# Patient Record
Sex: Female | Born: 1945
Health system: Southern US, Community
[De-identification: ages and names within clinical notes are randomized; demographics above are authoritative.]

## PROBLEM LIST (undated history)

## (undated) DIAGNOSIS — G834 Cauda equina syndrome: Secondary | ICD-10-CM

## (undated) DIAGNOSIS — R32 Unspecified urinary incontinence: Secondary | ICD-10-CM

## (undated) DIAGNOSIS — R112 Nausea with vomiting, unspecified: Secondary | ICD-10-CM

## (undated) DIAGNOSIS — M47816 Spondylosis without myelopathy or radiculopathy, lumbar region: Secondary | ICD-10-CM

## (undated) DIAGNOSIS — G8929 Other chronic pain: Secondary | ICD-10-CM

## (undated) DIAGNOSIS — T7421XA Adult sexual abuse, confirmed, initial encounter: Secondary | ICD-10-CM

## (undated) DIAGNOSIS — M549 Dorsalgia, unspecified: Secondary | ICD-10-CM

## (undated) DIAGNOSIS — M7060 Trochanteric bursitis, unspecified hip: Secondary | ICD-10-CM

## (undated) DIAGNOSIS — H04123 Dry eye syndrome of bilateral lacrimal glands: Secondary | ICD-10-CM

## (undated) DIAGNOSIS — J189 Pneumonia, unspecified organism: Secondary | ICD-10-CM

## (undated) DIAGNOSIS — M4180 Other forms of scoliosis, site unspecified: Secondary | ICD-10-CM

## (undated) DIAGNOSIS — R159 Full incontinence of feces: Secondary | ICD-10-CM

## (undated) DIAGNOSIS — I2510 Atherosclerotic heart disease of native coronary artery without angina pectoris: Secondary | ICD-10-CM

## (undated) DIAGNOSIS — N281 Cyst of kidney, acquired: Secondary | ICD-10-CM

## (undated) DIAGNOSIS — M6530 Trigger finger, unspecified finger: Secondary | ICD-10-CM

## (undated) DIAGNOSIS — M5416 Radiculopathy, lumbar region: Secondary | ICD-10-CM

## (undated) DIAGNOSIS — D759 Disease of blood and blood-forming organs, unspecified: Secondary | ICD-10-CM

## (undated) DIAGNOSIS — K5900 Constipation, unspecified: Secondary | ICD-10-CM

## (undated) DIAGNOSIS — B001 Herpesviral vesicular dermatitis: Secondary | ICD-10-CM

## (undated) DIAGNOSIS — M48061 Spinal stenosis, lumbar region without neurogenic claudication: Secondary | ICD-10-CM

## (undated) DIAGNOSIS — B009 Herpesviral infection, unspecified: Secondary | ICD-10-CM

## (undated) DIAGNOSIS — M76892 Other specified enthesopathies of left lower limb, excluding foot: Secondary | ICD-10-CM

## (undated) DIAGNOSIS — M5136 Other intervertebral disc degeneration, lumbar region: Secondary | ICD-10-CM

## (undated) DIAGNOSIS — F410 Panic disorder [episodic paroxysmal anxiety] without agoraphobia: Secondary | ICD-10-CM

## (undated) DIAGNOSIS — Z9889 Other specified postprocedural states: Secondary | ICD-10-CM

## (undated) DIAGNOSIS — M1990 Unspecified osteoarthritis, unspecified site: Secondary | ICD-10-CM

## (undated) DIAGNOSIS — G4700 Insomnia, unspecified: Secondary | ICD-10-CM

## (undated) DIAGNOSIS — L821 Other seborrheic keratosis: Secondary | ICD-10-CM

## (undated) DIAGNOSIS — Z8719 Personal history of other diseases of the digestive system: Secondary | ICD-10-CM

## (undated) DIAGNOSIS — M4302 Spondylolysis, cervical region: Secondary | ICD-10-CM

## (undated) DIAGNOSIS — S82409A Unspecified fracture of shaft of unspecified fibula, initial encounter for closed fracture: Secondary | ICD-10-CM

## (undated) DIAGNOSIS — I2699 Other pulmonary embolism without acute cor pulmonale: Secondary | ICD-10-CM

## (undated) DIAGNOSIS — Z87442 Personal history of urinary calculi: Secondary | ICD-10-CM

## (undated) DIAGNOSIS — M4802 Spinal stenosis, cervical region: Secondary | ICD-10-CM

## (undated) DIAGNOSIS — F419 Anxiety disorder, unspecified: Secondary | ICD-10-CM

## (undated) DIAGNOSIS — H353112 Nonexudative age-related macular degeneration, right eye, intermediate dry stage: Secondary | ICD-10-CM

## (undated) DIAGNOSIS — K7689 Other specified diseases of liver: Secondary | ICD-10-CM

## (undated) DIAGNOSIS — S82899A Other fracture of unspecified lower leg, initial encounter for closed fracture: Secondary | ICD-10-CM

## (undated) DIAGNOSIS — M51369 Other intervertebral disc degeneration, lumbar region without mention of lumbar back pain or lower extremity pain: Secondary | ICD-10-CM

## (undated) DIAGNOSIS — L570 Actinic keratosis: Secondary | ICD-10-CM

## (undated) DIAGNOSIS — I517 Cardiomegaly: Secondary | ICD-10-CM

## (undated) DIAGNOSIS — N390 Urinary tract infection, site not specified: Secondary | ICD-10-CM

## (undated) DIAGNOSIS — I700 Atherosclerosis of aorta: Secondary | ICD-10-CM

## (undated) DIAGNOSIS — B9620 Unspecified Escherichia coli [E. coli] as the cause of diseases classified elsewhere: Secondary | ICD-10-CM

## (undated) DIAGNOSIS — N319 Neuromuscular dysfunction of bladder, unspecified: Secondary | ICD-10-CM

## (undated) DIAGNOSIS — H348192 Central retinal vein occlusion, unspecified eye, stable: Secondary | ICD-10-CM

## (undated) DIAGNOSIS — I10 Essential (primary) hypertension: Secondary | ICD-10-CM

## (undated) DIAGNOSIS — H3530 Unspecified macular degeneration: Secondary | ICD-10-CM

## (undated) DIAGNOSIS — M797 Fibromyalgia: Secondary | ICD-10-CM

## (undated) DIAGNOSIS — E785 Hyperlipidemia, unspecified: Secondary | ICD-10-CM

## (undated) DIAGNOSIS — G43909 Migraine, unspecified, not intractable, without status migrainosus: Secondary | ICD-10-CM

## (undated) DIAGNOSIS — K8020 Calculus of gallbladder without cholecystitis without obstruction: Secondary | ICD-10-CM

## (undated) HISTORY — PX: TUBAL LIGATION: SHX77

## (undated) HISTORY — DX: Spinal stenosis, lumbar region without neurogenic claudication: M48.061

## (undated) HISTORY — DX: Fibromyalgia: M79.7

## (undated) HISTORY — DX: Other pulmonary embolism without acute cor pulmonale: I26.99

## (undated) HISTORY — PX: OTHER SURGICAL HISTORY: SHX169

## (undated) HISTORY — DX: Herpesviral vesicular dermatitis: B00.1

## (undated) HISTORY — DX: Essential (primary) hypertension: I10

## (undated) HISTORY — DX: Cauda equina syndrome: G83.4

## (undated) HISTORY — PX: CATARACT EXTRACTION, BILATERAL: SHX1313

## (undated) HISTORY — DX: Hyperlipidemia, unspecified: E78.5

## (undated) HISTORY — PX: COLONOSCOPY: SHX174

## (undated) HISTORY — PX: ROTATOR CUFF REPAIR: SHX139

## (undated) HISTORY — DX: Migraine, unspecified, not intractable, without status migrainosus: G43.909

## (undated) HISTORY — DX: Central retinal vein occlusion, unspecified eye, stable: H34.8192

## (undated) HISTORY — PX: HAND SURGERY: SHX662

## (undated) HISTORY — DX: Spinal stenosis, cervical region: M48.02

## (undated) HISTORY — PX: ABDOMINAL SURGERY: SHX537

## (undated) HISTORY — DX: Anxiety disorder, unspecified: F41.9

---

## 2009-10-27 DIAGNOSIS — M20012 Mallet finger of left finger(s): Secondary | ICD-10-CM | POA: Insufficient documentation

## 2010-03-19 DIAGNOSIS — M797 Fibromyalgia: Secondary | ICD-10-CM | POA: Insufficient documentation

## 2010-03-19 DIAGNOSIS — M5410 Radiculopathy, site unspecified: Secondary | ICD-10-CM | POA: Insufficient documentation

## 2010-03-19 DIAGNOSIS — M47817 Spondylosis without myelopathy or radiculopathy, lumbosacral region: Secondary | ICD-10-CM | POA: Insufficient documentation

## 2010-03-19 DIAGNOSIS — M47812 Spondylosis without myelopathy or radiculopathy, cervical region: Secondary | ICD-10-CM | POA: Insufficient documentation

## 2010-08-26 DIAGNOSIS — G8929 Other chronic pain: Secondary | ICD-10-CM | POA: Insufficient documentation

## 2010-08-26 DIAGNOSIS — M76899 Other specified enthesopathies of unspecified lower limb, excluding foot: Secondary | ICD-10-CM | POA: Insufficient documentation

## 2010-09-27 DIAGNOSIS — Z87898 Personal history of other specified conditions: Secondary | ICD-10-CM | POA: Insufficient documentation

## 2010-09-27 DIAGNOSIS — B009 Herpesviral infection, unspecified: Secondary | ICD-10-CM | POA: Insufficient documentation

## 2010-09-27 DIAGNOSIS — L280 Lichen simplex chronicus: Secondary | ICD-10-CM | POA: Insufficient documentation

## 2010-09-27 DIAGNOSIS — L570 Actinic keratosis: Secondary | ICD-10-CM | POA: Insufficient documentation

## 2010-11-02 DIAGNOSIS — L905 Scar conditions and fibrosis of skin: Secondary | ICD-10-CM | POA: Insufficient documentation

## 2010-11-02 DIAGNOSIS — L821 Other seborrheic keratosis: Secondary | ICD-10-CM | POA: Insufficient documentation

## 2011-02-08 DIAGNOSIS — D2239 Melanocytic nevi of other parts of face: Secondary | ICD-10-CM | POA: Insufficient documentation

## 2011-02-08 DIAGNOSIS — L738 Other specified follicular disorders: Secondary | ICD-10-CM | POA: Insufficient documentation

## 2011-07-04 DIAGNOSIS — H40009 Preglaucoma, unspecified, unspecified eye: Secondary | ICD-10-CM | POA: Insufficient documentation

## 2013-03-14 DIAGNOSIS — N390 Urinary tract infection, site not specified: Secondary | ICD-10-CM | POA: Diagnosis not present

## 2013-05-31 DIAGNOSIS — L600 Ingrowing nail: Secondary | ICD-10-CM | POA: Diagnosis not present

## 2013-06-28 DIAGNOSIS — L600 Ingrowing nail: Secondary | ICD-10-CM | POA: Diagnosis not present

## 2013-08-16 DIAGNOSIS — Z23 Encounter for immunization: Secondary | ICD-10-CM | POA: Diagnosis not present

## 2013-08-16 DIAGNOSIS — F4321 Adjustment disorder with depressed mood: Secondary | ICD-10-CM | POA: Diagnosis not present

## 2013-08-16 DIAGNOSIS — M545 Low back pain, unspecified: Secondary | ICD-10-CM | POA: Diagnosis not present

## 2013-08-22 DIAGNOSIS — IMO0002 Reserved for concepts with insufficient information to code with codable children: Secondary | ICD-10-CM | POA: Diagnosis not present

## 2013-08-22 DIAGNOSIS — M47812 Spondylosis without myelopathy or radiculopathy, cervical region: Secondary | ICD-10-CM | POA: Diagnosis not present

## 2013-08-22 DIAGNOSIS — M47817 Spondylosis without myelopathy or radiculopathy, lumbosacral region: Secondary | ICD-10-CM | POA: Diagnosis not present

## 2013-08-22 DIAGNOSIS — IMO0001 Reserved for inherently not codable concepts without codable children: Secondary | ICD-10-CM | POA: Diagnosis not present

## 2013-08-28 DIAGNOSIS — H3530 Unspecified macular degeneration: Secondary | ICD-10-CM | POA: Diagnosis not present

## 2013-08-28 DIAGNOSIS — H40009 Preglaucoma, unspecified, unspecified eye: Secondary | ICD-10-CM | POA: Diagnosis not present

## 2013-08-28 DIAGNOSIS — Z1231 Encounter for screening mammogram for malignant neoplasm of breast: Secondary | ICD-10-CM | POA: Diagnosis not present

## 2013-09-04 DIAGNOSIS — Z87898 Personal history of other specified conditions: Secondary | ICD-10-CM | POA: Diagnosis not present

## 2013-10-05 DIAGNOSIS — Z87898 Personal history of other specified conditions: Secondary | ICD-10-CM | POA: Diagnosis not present

## 2013-10-17 DIAGNOSIS — D239 Other benign neoplasm of skin, unspecified: Secondary | ICD-10-CM | POA: Diagnosis not present

## 2013-10-17 DIAGNOSIS — L821 Other seborrheic keratosis: Secondary | ICD-10-CM | POA: Diagnosis not present

## 2013-11-05 DIAGNOSIS — Z87898 Personal history of other specified conditions: Secondary | ICD-10-CM | POA: Diagnosis not present

## 2013-11-12 DIAGNOSIS — M549 Dorsalgia, unspecified: Secondary | ICD-10-CM | POA: Diagnosis not present

## 2013-11-19 DIAGNOSIS — H3530 Unspecified macular degeneration: Secondary | ICD-10-CM | POA: Diagnosis not present

## 2013-11-19 DIAGNOSIS — H40009 Preglaucoma, unspecified, unspecified eye: Secondary | ICD-10-CM | POA: Diagnosis not present

## 2013-12-05 DIAGNOSIS — Z87898 Personal history of other specified conditions: Secondary | ICD-10-CM | POA: Diagnosis not present

## 2014-01-02 DIAGNOSIS — M47817 Spondylosis without myelopathy or radiculopathy, lumbosacral region: Secondary | ICD-10-CM | POA: Diagnosis not present

## 2014-01-02 DIAGNOSIS — M159 Polyosteoarthritis, unspecified: Secondary | ICD-10-CM | POA: Diagnosis not present

## 2014-01-02 DIAGNOSIS — Z23 Encounter for immunization: Secondary | ICD-10-CM | POA: Diagnosis not present

## 2014-01-02 DIAGNOSIS — G479 Sleep disorder, unspecified: Secondary | ICD-10-CM | POA: Diagnosis not present

## 2014-01-02 DIAGNOSIS — M47812 Spondylosis without myelopathy or radiculopathy, cervical region: Secondary | ICD-10-CM | POA: Diagnosis not present

## 2014-01-06 DIAGNOSIS — Z87898 Personal history of other specified conditions: Secondary | ICD-10-CM | POA: Diagnosis not present

## 2014-02-04 DIAGNOSIS — Z87898 Personal history of other specified conditions: Secondary | ICD-10-CM | POA: Diagnosis not present

## 2014-02-12 ENCOUNTER — Ambulatory Visit (INDEPENDENT_AMBULATORY_CARE_PROVIDER_SITE_OTHER): Payer: Medicare Other | Admitting: Family Medicine

## 2014-02-12 ENCOUNTER — Ambulatory Visit (INDEPENDENT_AMBULATORY_CARE_PROVIDER_SITE_OTHER): Payer: Medicare Other

## 2014-02-12 VITALS — BP 110/70 | HR 60 | Temp 98.4°F | Resp 16 | Ht 65.5 in | Wt 146.0 lb

## 2014-02-12 DIAGNOSIS — S0031XA Abrasion of nose, initial encounter: Secondary | ICD-10-CM

## 2014-02-12 DIAGNOSIS — Z23 Encounter for immunization: Secondary | ICD-10-CM

## 2014-02-12 DIAGNOSIS — W19XXXA Unspecified fall, initial encounter: Secondary | ICD-10-CM | POA: Diagnosis not present

## 2014-02-12 DIAGNOSIS — M79642 Pain in left hand: Secondary | ICD-10-CM

## 2014-02-12 DIAGNOSIS — R040 Epistaxis: Secondary | ICD-10-CM

## 2014-02-12 DIAGNOSIS — N393 Stress incontinence (female) (male): Secondary | ICD-10-CM

## 2014-02-12 DIAGNOSIS — R32 Unspecified urinary incontinence: Secondary | ICD-10-CM | POA: Insufficient documentation

## 2014-02-12 DIAGNOSIS — R2681 Unsteadiness on feet: Secondary | ICD-10-CM | POA: Diagnosis not present

## 2014-02-12 DIAGNOSIS — T148XXA Other injury of unspecified body region, initial encounter: Secondary | ICD-10-CM

## 2014-02-12 DIAGNOSIS — I10 Essential (primary) hypertension: Secondary | ICD-10-CM

## 2014-02-12 DIAGNOSIS — T148 Other injury of unspecified body region: Secondary | ICD-10-CM | POA: Diagnosis not present

## 2014-02-12 MED ORDER — TETANUS-DIPHTHERIA TOXOIDS TD 5-2 LFU IM INJ: 0.5000 mL | INJECTION | Freq: Once | INTRAMUSCULAR | Status: DC

## 2014-02-12 NOTE — Progress Notes (Signed)
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 She fell while walking her dog in the park this morning when she stepped on a surface optical.  She landed straight on her nose and had a bloody nose. She also scraped her hands.  Patient says her right hand is nontender although it is mildly ecchymotic.  Left hand is swollen and tender in the thenar region. Patient says that she has had surgery in that hand in the past.  Patient denies any shoulder pain, loss of consciousness, or leg pain. Patient is unsure of her last tetanus shot  Objective: Patient has an abrasion on the right bridge of her nose with ecchymosis as well.  Examination of the nasal passage reveals small amount of blood but no active bleeding. Patient is alert and coordinated, articulate, with no neck pain and full range of motion of her neck.  Patient has full range of motion both shoulders.  Left hand has mild swelling in the thenar region and there is ecchymosis there as well. She is mildly tender in the left renal region.  Right hand shows full range of motion. Both wrists have good range of motion. Right hand shows ecchymosis in the proximal volar hand.  No wounds or visible on the knees and she has good range of motion of both lower extremities.  Intraocular motion is normal and gait is stable.  [facility] reading (PRIMARY) by  Dr.  Joseph Art: Left hand shows metal from prior ORIF.  No acute fx  Fall, initial encounter - Plan: DG Hand Complete Left  Abrasion - Plan: DISCONTINUED: tetanus & diphtheria toxoids (adult) (TENIVAC) injection 0.5 mL  Unstable gait  Epistaxis  Hand pain, left - Plan: DG Hand Complete Left  Stress incontinence  Essential hypertension  Need for TD vaccine - Plan: Td vaccine greater than or equal to 7yo preservative free IM Gentle washing of wounds.  Signed, Robyn Haber, MD

## 2014-03-07 DIAGNOSIS — Z87898 Personal history of other specified conditions: Secondary | ICD-10-CM | POA: Diagnosis not present

## 2014-03-07 DIAGNOSIS — S82899A Other fracture of unspecified lower leg, initial encounter for closed fracture: Secondary | ICD-10-CM

## 2014-03-07 HISTORY — DX: Other fracture of unspecified lower leg, initial encounter for closed fracture: S82.899A

## 2014-03-09 ENCOUNTER — Encounter (HOSPITAL_COMMUNITY): Payer: Self-pay

## 2014-03-09 ENCOUNTER — Ambulatory Visit (HOSPITAL_COMMUNITY)
Admission: RE | Admit: 2014-03-09 | Discharge: 2014-03-09 | Disposition: A | Discharge status: Home / Self Care | Payer: Medicare Other | Source: Ambulatory Visit | Attending: Family Medicine | Admitting: Family Medicine

## 2014-03-09 ENCOUNTER — Ambulatory Visit (INDEPENDENT_AMBULATORY_CARE_PROVIDER_SITE_OTHER): Payer: Medicare Other | Admitting: Family Medicine

## 2014-03-09 VITALS — BP 144/88 | HR 61 | Temp 97.6°F | Resp 18 | Ht 65.5 in | Wt 146.0 lb

## 2014-03-09 DIAGNOSIS — R040 Epistaxis: Secondary | ICD-10-CM | POA: Diagnosis not present

## 2014-03-09 DIAGNOSIS — R51 Headache: Secondary | ICD-10-CM | POA: Insufficient documentation

## 2014-03-09 DIAGNOSIS — S0003XA Contusion of scalp, initial encounter: Secondary | ICD-10-CM | POA: Diagnosis not present

## 2014-03-09 DIAGNOSIS — Y92009 Unspecified place in unspecified non-institutional (private) residence as the place of occurrence of the external cause: Principal | ICD-10-CM

## 2014-03-09 DIAGNOSIS — Y92099 Unspecified place in other non-institutional residence as the place of occurrence of the external cause: Secondary | ICD-10-CM

## 2014-03-09 DIAGNOSIS — R42 Dizziness and giddiness: Secondary | ICD-10-CM | POA: Insufficient documentation

## 2014-03-09 DIAGNOSIS — W19XXXA Unspecified fall, initial encounter: Secondary | ICD-10-CM | POA: Insufficient documentation

## 2014-03-09 DIAGNOSIS — S0990XA Unspecified injury of head, initial encounter: Secondary | ICD-10-CM | POA: Diagnosis present

## 2014-03-09 NOTE — Progress Notes (Signed)
Subjective:    Patient ID: Kaylee Weiss, female    DOB: [birth year], 69 y.o.   MRN: [id number]  HPI Chief Complaint  Patient presents with   Fall    hit her head and left shoulder   Epistaxis   This chart was scribed for Robyn Haber, MD by Thea Alken, ED Scribe. This patient was seen in room 7 and the patient's care was started at 3:05 PM.  HPI Comments: Kaylee Weiss is a 69 y.o. female who presents to the [facility] complaining of a fall PTA. Pt states she hit her head on a laundry shoot in her bedroom due to her shoe become stuck to the rug. Pt reports epistaxis, bleeding is controlled at this time, back pain, shoulder pain, generalized HA and a mild dizziness. Pt was seen for a fall 1 month ago. She denies LOC.  Past Medical History  Diagnosis Date   Hypertension    Fibromyalgia    Allergies  Allergen Reactions   Demerol [Meperidine] Anaphylaxis   Percocet [Oxycodone-Acetaminophen] Nausea And Vomiting   Prior to Admission medications   Medication Sig Start Date End Date Taking? Authorizing Provider  aspirin 81 MG tablet Take 81 mg by mouth daily.   Yes Historical Provider, MD  celecoxib (CELEBREX) 200 MG capsule Take 200 mg by mouth 2 (two) times daily.   Yes Historical Provider, MD  folic acid (FOLVITE) 1 MG tablet Take 1 mg by mouth daily.   Yes Historical Provider, MD  losartan (COZAAR) 50 MG tablet Take 50 mg by mouth daily.   Yes Historical Provider, MD  metoprolol (TOPROL-XL) 200 MG 24 hr tablet Take 200 mg by mouth daily.   Yes Historical Provider, MD  tolterodine (DETROL LA) 4 MG 24 hr capsule Take 4 mg by mouth daily.   Yes Historical Provider, MD  valACYclovir (VALTREX) 500 MG tablet Take 500 mg by mouth 2 (two) times daily.   Yes Historical Provider, MD   Review of Systems  HENT: Positive for nosebleeds.   Musculoskeletal: Positive for myalgias and back pain. Negative for gait problem.  Skin: Negative for wound.  Neurological:  Positive for dizziness and headaches. Negative for syncope, weakness and numbness.   Objective:   Physical Exam  Constitutional: She is oriented to person, place, and time. She appears well-developed and well-nourished. No distress.  HENT:  Head: Normocephalic and atraumatic.  Eyes: Conjunctivae and EOM are normal.  Neck: Neck supple.  Cardiovascular: Normal rate.   Pulmonary/Chest: Effort normal.  Musculoskeletal: Normal range of motion.  Neurological: She is alert and oriented to person, place, and time.  Cranial nerves 2-12 are intact. Steady gait. Pt is able to walk on heels.     Skin: Skin is warm and dry.  Psychiatric: She has a normal mood and affect. Her behavior is normal.  Nursing note and vitals reviewed.  dried blood left nostril Swollen ecchymotic left eye brow  Neg CT head without contrast Assessment & Plan:   1. Fall at home, initial encounter   2. Head injury, initial encounter    This chart was scribed in my presence and reviewed by me personally.    ICD-9-CM ICD-10-CM   1. Fall at home, initial encounter [phone] W19.XXXA CT Head Wo Contrast   E849.0 Y92.099 Ambulatory referral to Physical Therapy  2. Head injury, initial encounter 959.01 S09.90XA CT Head Wo Contrast     Ambulatory referral to Physical Therapy     Signed, Robyn Haber, MD

## 2014-03-09 NOTE — Patient Instructions (Addendum)
Go to [facility] and register at the Emergency Department for [facility]. DO NOT register as ER patient.   Head Injury You have received a head injury. It does not appear serious at this time. Headaches and vomiting are common following head injury. It should be easy to awaken from sleeping. Sometimes it is necessary for you to stay in the emergency department for a while for observation. Sometimes admission to the hospital may be needed. After injuries such as yours, most problems occur within the first 24 hours, but side effects may occur up to 7-10 days after the injury. It is important for you to carefully monitor your condition and contact your health care provider or seek immediate medical care if there is a change in your condition. WHAT ARE THE TYPES OF HEAD INJURIES? Head injuries can be as minor as a bump. Some head injuries can be more severe. More severe head injuries include:  A jarring injury to the brain (concussion).  A bruise of the brain (contusion). This mean there is bleeding in the brain that can cause swelling.  A cracked skull (skull fracture).  Bleeding in the brain that collects, clots, and forms a bump (hematoma). WHAT CAUSES A HEAD INJURY? A serious head injury is most likely to happen to someone who is in a car wreck and is not wearing a seat belt. Other causes of major head injuries include bicycle or motorcycle accidents, sports injuries, and falls. HOW ARE HEAD INJURIES DIAGNOSED? A complete history of the event leading to the injury and your current symptoms will be helpful in diagnosing head injuries. Many times, pictures of the brain, such as CT or MRI are needed to see the extent of the injury. Often, an overnight hospital stay is necessary for observation.  WHEN SHOULD I SEEK IMMEDIATE MEDICAL CARE?  You should get help right away if:  You have confusion or drowsiness.  You feel sick to your stomach (nauseous) or have continued, forceful  vomiting.  You have dizziness or unsteadiness that is getting worse.  You have severe, continued headaches not relieved by medicine. Only take over-the-counter or prescription medicines for pain, fever, or discomfort as directed by your health care provider.  You do not have normal function of the arms or legs or are unable to walk.  You notice changes in the black spots in the center of the colored part of your eye (pupil).  You have a clear or bloody fluid coming from your nose or ears.  You have a loss of vision. During the next 24 hours after the injury, you must stay with someone who can watch you for the warning signs. This person should contact local emergency services (911 in the [place] if you have seizures, you become unconscious, or you are unable to wake up. HOW CAN I PREVENT A HEAD INJURY IN THE FUTURE? The most important factor for preventing major head injuries is avoiding motor vehicle accidents. To minimize the potential for damage to your head, it is crucial to wear seat belts while riding in motor vehicles. Wearing helmets while bike riding and playing collision sports (like football) is also helpful. Also, avoiding dangerous activities around the house will further help reduce your risk of head injury.  WHEN CAN I RETURN TO NORMAL ACTIVITIES AND ATHLETICS? You should be reevaluated by your health care provider before returning to these activities. If you have any of the following symptoms, you should not return to activities or contact sports  until 1 week after the symptoms have stopped:  Persistent headache.  Dizziness or vertigo.  Poor attention and concentration.  Confusion.  Memory problems.  Nausea or vomiting.  Fatigue or tire easily.  Irritability.  Intolerant of bright lights or loud noises.  Anxiety or depression.  Disturbed sleep. MAKE SURE YOU:   Understand these instructions.  Will watch your condition.  Will get help right away if you are  not doing well or get worse. Document Released: 02/21/2005 Document Revised: 02/26/2013 Document Reviewed: 10/29/2012 [facility] Patient Information 2015 [place], [place]. This information is not intended to replace advice given to you by your health care provider. Make sure you discuss any questions you have with your health care provider.

## 2014-03-25 DIAGNOSIS — M6281 Muscle weakness (generalized): Secondary | ICD-10-CM | POA: Diagnosis not present

## 2014-03-25 DIAGNOSIS — S0990XD Unspecified injury of head, subsequent encounter: Secondary | ICD-10-CM | POA: Diagnosis not present

## 2014-03-25 DIAGNOSIS — R262 Difficulty in walking, not elsewhere classified: Secondary | ICD-10-CM | POA: Diagnosis not present

## 2014-03-25 DIAGNOSIS — W19XXXD Unspecified fall, subsequent encounter: Secondary | ICD-10-CM | POA: Diagnosis not present

## 2014-03-27 ENCOUNTER — Encounter: Payer: Self-pay | Admitting: Family Medicine

## 2014-03-27 ENCOUNTER — Ambulatory Visit (INDEPENDENT_AMBULATORY_CARE_PROVIDER_SITE_OTHER): Payer: Medicare Other | Admitting: Family Medicine

## 2014-03-27 VITALS — BP 112/66 | HR 54 | Temp 97.4°F | Resp 16 | Ht 65.0 in | Wt 144.0 lb

## 2014-03-27 DIAGNOSIS — H5310 Unspecified subjective visual disturbances: Secondary | ICD-10-CM

## 2014-03-27 DIAGNOSIS — H53141 Visual discomfort, right eye: Secondary | ICD-10-CM

## 2014-03-27 NOTE — Progress Notes (Signed)
° °  Subjective:    Patient ID: Kaylee Weiss, female    DOB: 24-Sep-1945, 69 y.o.   MRN: [id number] This chart was scribed for Robyn Haber, MD by Zola Button, Medical Scribe. This patient was seen in Room 24 and the patient's care was started at 1:39 PM.   HPI HPI Comments: Kaylee Weiss is a 69 y.o. female who presents to the [facility] to establish care. Patient states her vision has been "off;" she states she has been diagnosed with macular degeneration. She states she has some difficulty seeing. She states she falls often, about 10 or more times per year. Patient has been to PT, who found problems with her gait. However, she states that she has had problems for a while and that it is nothing new. She does have hx of PE.  Patient used to live in [place], [place]. However, she still has a cottage there outside of [place] [employer] [place]. 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 She fell while walking her dog in the park last month when she stepped on a surface obstacle       Review of Systems  Eyes: Positive for visual disturbance.  Musculoskeletal: Positive for gait problem.       Objective:   Physical Exam CONSTITUTIONAL: Well developed/well nourished HEAD: Normocephalic/atraumatic EYES: EOM/PERRL ENMT: Mucous membranes moist NECK: supple no meningeal signs SPINE: entire spine nontender CV: S1/S2 noted, no murmurs/rubs/gallops noted LUNGS: Lungs are clear to auscultation bilaterally, no apparent distress ABDOMEN: soft, nontender, no rebound or guarding GU: no cva tenderness NEURO: Pt is awake/alert, moves all extremitiesx4 EXTREMITIES: pulses normal, full ROM SKIN: warm, color normal PSYCH: no abnormalities of mood noted        Assessment & Plan:   This chart was scribed in my presence and reviewed by me personally.   ICD-9-CM ICD-10-CM   1. Right eye strain 368.13 H53.141 Ambulatory referral to Ophthalmology     Signed, Robyn Haber, MD

## 2014-04-01 DIAGNOSIS — W19XXXD Unspecified fall, subsequent encounter: Secondary | ICD-10-CM | POA: Diagnosis not present

## 2014-04-01 DIAGNOSIS — S0990XD Unspecified injury of head, subsequent encounter: Secondary | ICD-10-CM | POA: Diagnosis not present

## 2014-04-01 DIAGNOSIS — R262 Difficulty in walking, not elsewhere classified: Secondary | ICD-10-CM | POA: Diagnosis not present

## 2014-04-01 DIAGNOSIS — M6281 Muscle weakness (generalized): Secondary | ICD-10-CM | POA: Diagnosis not present

## 2014-04-03 ENCOUNTER — Telehealth: Payer: Self-pay

## 2014-04-03 NOTE — Telephone Encounter (Signed)
Spoke with referrals and pt has appt with Dr. Bing Plume. Pt contacted and notified.

## 2014-04-03 NOTE — Telephone Encounter (Signed)
Pt states that her vision has gotten progressively worse, she would like to know the status of her referral to the opthalmologist. Best# [phone]

## 2014-04-04 DIAGNOSIS — S0990XD Unspecified injury of head, subsequent encounter: Secondary | ICD-10-CM | POA: Diagnosis not present

## 2014-04-04 DIAGNOSIS — W19XXXD Unspecified fall, subsequent encounter: Secondary | ICD-10-CM | POA: Diagnosis not present

## 2014-04-04 DIAGNOSIS — R262 Difficulty in walking, not elsewhere classified: Secondary | ICD-10-CM | POA: Diagnosis not present

## 2014-04-04 DIAGNOSIS — M6281 Muscle weakness (generalized): Secondary | ICD-10-CM | POA: Diagnosis not present

## 2014-04-07 DIAGNOSIS — M6281 Muscle weakness (generalized): Secondary | ICD-10-CM | POA: Diagnosis not present

## 2014-04-07 DIAGNOSIS — S0990XD Unspecified injury of head, subsequent encounter: Secondary | ICD-10-CM | POA: Diagnosis not present

## 2014-04-07 DIAGNOSIS — Z87898 Personal history of other specified conditions: Secondary | ICD-10-CM | POA: Diagnosis not present

## 2014-04-07 DIAGNOSIS — W19XXXD Unspecified fall, subsequent encounter: Secondary | ICD-10-CM | POA: Diagnosis not present

## 2014-04-07 DIAGNOSIS — R262 Difficulty in walking, not elsewhere classified: Secondary | ICD-10-CM | POA: Diagnosis not present

## 2014-04-08 DIAGNOSIS — W19XXXD Unspecified fall, subsequent encounter: Secondary | ICD-10-CM | POA: Diagnosis not present

## 2014-04-08 DIAGNOSIS — S0990XD Unspecified injury of head, subsequent encounter: Secondary | ICD-10-CM | POA: Diagnosis not present

## 2014-04-08 DIAGNOSIS — M6281 Muscle weakness (generalized): Secondary | ICD-10-CM | POA: Diagnosis not present

## 2014-04-08 DIAGNOSIS — R262 Difficulty in walking, not elsewhere classified: Secondary | ICD-10-CM | POA: Diagnosis not present

## 2014-04-11 DIAGNOSIS — W19XXXD Unspecified fall, subsequent encounter: Secondary | ICD-10-CM | POA: Diagnosis not present

## 2014-04-11 DIAGNOSIS — M6281 Muscle weakness (generalized): Secondary | ICD-10-CM | POA: Diagnosis not present

## 2014-04-11 DIAGNOSIS — S0990XD Unspecified injury of head, subsequent encounter: Secondary | ICD-10-CM | POA: Diagnosis not present

## 2014-04-11 DIAGNOSIS — R262 Difficulty in walking, not elsewhere classified: Secondary | ICD-10-CM | POA: Diagnosis not present

## 2014-04-14 DIAGNOSIS — H3531 Nonexudative age-related macular degeneration: Secondary | ICD-10-CM | POA: Diagnosis not present

## 2014-04-14 DIAGNOSIS — H524 Presbyopia: Secondary | ICD-10-CM | POA: Diagnosis not present

## 2014-04-15 DIAGNOSIS — W19XXXD Unspecified fall, subsequent encounter: Secondary | ICD-10-CM | POA: Diagnosis not present

## 2014-04-15 DIAGNOSIS — S0990XD Unspecified injury of head, subsequent encounter: Secondary | ICD-10-CM | POA: Diagnosis not present

## 2014-04-15 DIAGNOSIS — R262 Difficulty in walking, not elsewhere classified: Secondary | ICD-10-CM | POA: Diagnosis not present

## 2014-04-15 DIAGNOSIS — M6281 Muscle weakness (generalized): Secondary | ICD-10-CM | POA: Diagnosis not present

## 2014-04-22 DIAGNOSIS — R262 Difficulty in walking, not elsewhere classified: Secondary | ICD-10-CM | POA: Diagnosis not present

## 2014-04-22 DIAGNOSIS — W19XXXD Unspecified fall, subsequent encounter: Secondary | ICD-10-CM | POA: Diagnosis not present

## 2014-04-22 DIAGNOSIS — M6281 Muscle weakness (generalized): Secondary | ICD-10-CM | POA: Diagnosis not present

## 2014-04-22 DIAGNOSIS — S0990XD Unspecified injury of head, subsequent encounter: Secondary | ICD-10-CM | POA: Diagnosis not present

## 2014-04-28 DIAGNOSIS — W19XXXD Unspecified fall, subsequent encounter: Secondary | ICD-10-CM | POA: Diagnosis not present

## 2014-04-28 DIAGNOSIS — R262 Difficulty in walking, not elsewhere classified: Secondary | ICD-10-CM | POA: Diagnosis not present

## 2014-04-28 DIAGNOSIS — M6281 Muscle weakness (generalized): Secondary | ICD-10-CM | POA: Diagnosis not present

## 2014-04-28 DIAGNOSIS — S0990XD Unspecified injury of head, subsequent encounter: Secondary | ICD-10-CM | POA: Diagnosis not present

## 2014-05-02 DIAGNOSIS — M6281 Muscle weakness (generalized): Secondary | ICD-10-CM | POA: Diagnosis not present

## 2014-05-02 DIAGNOSIS — S0990XD Unspecified injury of head, subsequent encounter: Secondary | ICD-10-CM | POA: Diagnosis not present

## 2014-05-02 DIAGNOSIS — W19XXXD Unspecified fall, subsequent encounter: Secondary | ICD-10-CM | POA: Diagnosis not present

## 2014-05-02 DIAGNOSIS — R262 Difficulty in walking, not elsewhere classified: Secondary | ICD-10-CM | POA: Diagnosis not present

## 2014-05-05 DIAGNOSIS — M6281 Muscle weakness (generalized): Secondary | ICD-10-CM | POA: Diagnosis not present

## 2014-05-05 DIAGNOSIS — R262 Difficulty in walking, not elsewhere classified: Secondary | ICD-10-CM | POA: Diagnosis not present

## 2014-05-05 DIAGNOSIS — W19XXXD Unspecified fall, subsequent encounter: Secondary | ICD-10-CM | POA: Diagnosis not present

## 2014-05-05 DIAGNOSIS — S0990XD Unspecified injury of head, subsequent encounter: Secondary | ICD-10-CM | POA: Diagnosis not present

## 2014-05-06 DIAGNOSIS — H31092 Other chorioretinal scars, left eye: Secondary | ICD-10-CM | POA: Diagnosis not present

## 2014-05-06 DIAGNOSIS — H43813 Vitreous degeneration, bilateral: Secondary | ICD-10-CM | POA: Diagnosis not present

## 2014-05-06 DIAGNOSIS — H3531 Nonexudative age-related macular degeneration: Secondary | ICD-10-CM | POA: Diagnosis not present

## 2014-05-06 DIAGNOSIS — Z87898 Personal history of other specified conditions: Secondary | ICD-10-CM | POA: Diagnosis not present

## 2014-05-09 DIAGNOSIS — W19XXXD Unspecified fall, subsequent encounter: Secondary | ICD-10-CM | POA: Diagnosis not present

## 2014-05-09 DIAGNOSIS — R262 Difficulty in walking, not elsewhere classified: Secondary | ICD-10-CM | POA: Diagnosis not present

## 2014-05-09 DIAGNOSIS — S0990XD Unspecified injury of head, subsequent encounter: Secondary | ICD-10-CM | POA: Diagnosis not present

## 2014-05-09 DIAGNOSIS — M6281 Muscle weakness (generalized): Secondary | ICD-10-CM | POA: Diagnosis not present

## 2014-05-12 DIAGNOSIS — S0990XD Unspecified injury of head, subsequent encounter: Secondary | ICD-10-CM | POA: Diagnosis not present

## 2014-05-12 DIAGNOSIS — M6281 Muscle weakness (generalized): Secondary | ICD-10-CM | POA: Diagnosis not present

## 2014-05-12 DIAGNOSIS — W19XXXD Unspecified fall, subsequent encounter: Secondary | ICD-10-CM | POA: Diagnosis not present

## 2014-05-12 DIAGNOSIS — R262 Difficulty in walking, not elsewhere classified: Secondary | ICD-10-CM | POA: Diagnosis not present

## 2014-05-13 ENCOUNTER — Telehealth: Payer: Self-pay

## 2014-05-13 NOTE — Telephone Encounter (Signed)
Pt is needing refill on losartan (COZAAR) 50 MG tablet [[id number]] to [facility] in [address]

## 2014-05-14 MED ORDER — LOSARTAN POTASSIUM 50 MG PO TABS: 50.0000 mg | ORAL_TABLET | Freq: Every day | ORAL | Status: DC

## 2014-05-14 NOTE — Telephone Encounter (Signed)
Refill sent.

## 2014-05-14 NOTE — Telephone Encounter (Signed)
Left message advising pt I sent in her refill.

## 2014-05-20 DIAGNOSIS — S0990XD Unspecified injury of head, subsequent encounter: Secondary | ICD-10-CM | POA: Diagnosis not present

## 2014-05-20 DIAGNOSIS — W19XXXD Unspecified fall, subsequent encounter: Secondary | ICD-10-CM | POA: Diagnosis not present

## 2014-05-20 DIAGNOSIS — M6281 Muscle weakness (generalized): Secondary | ICD-10-CM | POA: Diagnosis not present

## 2014-05-20 DIAGNOSIS — R262 Difficulty in walking, not elsewhere classified: Secondary | ICD-10-CM | POA: Diagnosis not present

## 2014-05-23 DIAGNOSIS — R262 Difficulty in walking, not elsewhere classified: Secondary | ICD-10-CM | POA: Diagnosis not present

## 2014-05-23 DIAGNOSIS — W19XXXD Unspecified fall, subsequent encounter: Secondary | ICD-10-CM | POA: Diagnosis not present

## 2014-05-23 DIAGNOSIS — S0990XD Unspecified injury of head, subsequent encounter: Secondary | ICD-10-CM | POA: Diagnosis not present

## 2014-05-23 DIAGNOSIS — M6281 Muscle weakness (generalized): Secondary | ICD-10-CM | POA: Diagnosis not present

## 2014-05-26 DIAGNOSIS — M6281 Muscle weakness (generalized): Secondary | ICD-10-CM | POA: Diagnosis not present

## 2014-05-26 DIAGNOSIS — R262 Difficulty in walking, not elsewhere classified: Secondary | ICD-10-CM | POA: Diagnosis not present

## 2014-05-26 DIAGNOSIS — S0990XD Unspecified injury of head, subsequent encounter: Secondary | ICD-10-CM | POA: Diagnosis not present

## 2014-05-26 DIAGNOSIS — W19XXXD Unspecified fall, subsequent encounter: Secondary | ICD-10-CM | POA: Diagnosis not present

## 2014-05-27 DIAGNOSIS — M6281 Muscle weakness (generalized): Secondary | ICD-10-CM | POA: Diagnosis not present

## 2014-05-27 DIAGNOSIS — R262 Difficulty in walking, not elsewhere classified: Secondary | ICD-10-CM | POA: Diagnosis not present

## 2014-05-27 DIAGNOSIS — W19XXXD Unspecified fall, subsequent encounter: Secondary | ICD-10-CM | POA: Diagnosis not present

## 2014-05-27 DIAGNOSIS — S0990XD Unspecified injury of head, subsequent encounter: Secondary | ICD-10-CM | POA: Diagnosis not present

## 2014-06-06 DIAGNOSIS — Z87898 Personal history of other specified conditions: Secondary | ICD-10-CM | POA: Diagnosis not present

## 2014-06-13 DIAGNOSIS — R262 Difficulty in walking, not elsewhere classified: Secondary | ICD-10-CM | POA: Diagnosis not present

## 2014-06-13 DIAGNOSIS — S0990XD Unspecified injury of head, subsequent encounter: Secondary | ICD-10-CM | POA: Diagnosis not present

## 2014-06-13 DIAGNOSIS — M6281 Muscle weakness (generalized): Secondary | ICD-10-CM | POA: Diagnosis not present

## 2014-06-13 DIAGNOSIS — W19XXXD Unspecified fall, subsequent encounter: Secondary | ICD-10-CM | POA: Diagnosis not present

## 2014-06-30 DIAGNOSIS — W19XXXD Unspecified fall, subsequent encounter: Secondary | ICD-10-CM | POA: Diagnosis not present

## 2014-06-30 DIAGNOSIS — M6281 Muscle weakness (generalized): Secondary | ICD-10-CM | POA: Diagnosis not present

## 2014-06-30 DIAGNOSIS — R262 Difficulty in walking, not elsewhere classified: Secondary | ICD-10-CM | POA: Diagnosis not present

## 2014-06-30 DIAGNOSIS — S0990XD Unspecified injury of head, subsequent encounter: Secondary | ICD-10-CM | POA: Diagnosis not present

## 2014-07-06 DIAGNOSIS — Z87898 Personal history of other specified conditions: Secondary | ICD-10-CM | POA: Diagnosis not present

## 2014-07-31 ENCOUNTER — Encounter: Payer: Self-pay | Admitting: Family Medicine

## 2014-07-31 ENCOUNTER — Ambulatory Visit (INDEPENDENT_AMBULATORY_CARE_PROVIDER_SITE_OTHER): Payer: Medicare Other | Admitting: Family Medicine

## 2014-07-31 VITALS — BP 141/84 | HR 58 | Temp 98.0°F | Resp 16

## 2014-07-31 DIAGNOSIS — Z8639 Personal history of other endocrine, nutritional and metabolic disease: Secondary | ICD-10-CM | POA: Diagnosis not present

## 2014-07-31 DIAGNOSIS — Z79899 Other long term (current) drug therapy: Secondary | ICD-10-CM | POA: Diagnosis not present

## 2014-07-31 DIAGNOSIS — G629 Polyneuropathy, unspecified: Secondary | ICD-10-CM

## 2014-07-31 DIAGNOSIS — I10 Essential (primary) hypertension: Secondary | ICD-10-CM | POA: Diagnosis not present

## 2014-07-31 DIAGNOSIS — B009 Herpesviral infection, unspecified: Secondary | ICD-10-CM | POA: Diagnosis not present

## 2014-07-31 DIAGNOSIS — N3946 Mixed incontinence: Secondary | ICD-10-CM | POA: Diagnosis not present

## 2014-07-31 DIAGNOSIS — N3289 Other specified disorders of bladder: Secondary | ICD-10-CM

## 2014-07-31 DIAGNOSIS — F329 Major depressive disorder, single episode, unspecified: Secondary | ICD-10-CM

## 2014-07-31 DIAGNOSIS — E785 Hyperlipidemia, unspecified: Secondary | ICD-10-CM

## 2014-07-31 DIAGNOSIS — F32A Depression, unspecified: Secondary | ICD-10-CM

## 2014-07-31 DIAGNOSIS — R269 Unspecified abnormalities of gait and mobility: Secondary | ICD-10-CM | POA: Diagnosis not present

## 2014-07-31 DIAGNOSIS — R2681 Unsteadiness on feet: Secondary | ICD-10-CM | POA: Diagnosis not present

## 2014-07-31 DIAGNOSIS — M4806 Spinal stenosis, lumbar region: Secondary | ICD-10-CM

## 2014-07-31 DIAGNOSIS — M48061 Spinal stenosis, lumbar region without neurogenic claudication: Secondary | ICD-10-CM

## 2014-07-31 LAB — CBC WITH DIFFERENTIAL/PLATELET
Basophils Absolute: 0 10*3/uL (ref 0.0–0.1)
Basophils Relative: 0 % (ref 0–1)
Eosinophils Absolute: 0.1 10*3/uL (ref 0.0–0.7)
Eosinophils Relative: 2 % (ref 0–5)
HCT: 43.9 % (ref 36.0–46.0)
Hemoglobin: 14.5 g/dL (ref 12.0–15.0)
Lymphocytes Relative: 36 % (ref 12–46)
Lymphs Abs: 2.6 10*3/uL (ref 0.7–4.0)
MCH: 31.4 pg (ref 26.0–34.0)
MCHC: 33 g/dL (ref 30.0–36.0)
MCV: 95 fL (ref 78.0–100.0)
MPV: 9.9 fL (ref 8.6–12.4)
Monocytes Absolute: 0.5 10*3/uL (ref 0.1–1.0)
Monocytes Relative: 7 % (ref 3–12)
Neutro Abs: 4 10*3/uL (ref 1.7–7.7)
Neutrophils Relative %: 55 % (ref 43–77)
Platelets: 238 10*3/uL (ref 150–400)
RBC: 4.62 MIL/uL (ref 3.87–5.11)
RDW: 14 % (ref 11.5–15.5)
WBC: 7.3 10*3/uL (ref 4.0–10.5)

## 2014-07-31 LAB — POCT URINALYSIS DIPSTICK
Bilirubin, UA: NEGATIVE
Blood, UA: NEGATIVE
Glucose, UA: NEGATIVE
Ketones, UA: NEGATIVE
Nitrite, UA: NEGATIVE
Protein, UA: NEGATIVE
Spec Grav, UA: 1.01
Urobilinogen, UA: 0.2
pH, UA: 5.5

## 2014-07-31 LAB — POCT UA - MICROSCOPIC ONLY
Bacteria, U Microscopic: NEGATIVE
Casts, Ur, LPF, POC: NEGATIVE
Crystals, Ur, HPF, POC: NEGATIVE
Mucus, UA: NEGATIVE
RBC, urine, microscopic: NEGATIVE
Yeast, UA: NEGATIVE

## 2014-07-31 LAB — COMPLETE METABOLIC PANEL WITH GFR
ALT: 47 U/L — ABNORMAL HIGH (ref 0–35)
AST: 36 U/L (ref 0–37)
Albumin: 4.3 g/dL (ref 3.5–5.2)
Alkaline Phosphatase: 98 U/L (ref 39–117)
BUN: 19 mg/dL (ref 6–23)
CO2: 26 mEq/L (ref 19–32)
Calcium: 9.8 mg/dL (ref 8.4–10.5)
Chloride: 105 mEq/L (ref 96–112)
Creat: 0.72 mg/dL (ref 0.50–1.10)
GFR, Est African American: >89 mL/min
GFR, Est Non African American: 86 mL/min
Glucose, Bld: 88 mg/dL (ref 70–99)
Potassium: 4.5 mEq/L (ref 3.5–5.3)
Sodium: 139 mEq/L (ref 135–145)
Total Bilirubin: 0.8 mg/dL (ref 0.2–1.2)
Total Protein: 6.8 g/dL (ref 6.0–8.3)

## 2014-07-31 LAB — LIPID PANEL
Cholesterol: 144 mg/dL (ref 0–200)
HDL: 45 mg/dL — ABNORMAL LOW (ref >=46)
LDL Cholesterol: 73 mg/dL (ref 0–99)
Total CHOL/HDL Ratio: 3.2 Ratio
Triglycerides: 129 mg/dL (ref <150)
VLDL: 26 mg/dL (ref 0–40)

## 2014-07-31 LAB — TSH: TSH: 1.273 u[IU]/mL (ref 0.350–4.500)

## 2014-07-31 LAB — VITAMIN B12: Vitamin B-12: 1509 pg/mL — ABNORMAL HIGH (ref 211–911)

## 2014-07-31 MED ORDER — BUPROPION HCL ER (XL) 150 MG PO TB24: 150.0000 mg | ORAL_TABLET | Freq: Every day | ORAL | Status: DC

## 2014-07-31 MED ORDER — EFLORNITHINE HCL 13.9 % EX CREA: 1.0000 "application " | TOPICAL_CREAM | Freq: Two times a day (BID) | CUTANEOUS | Status: DC

## 2014-07-31 MED ORDER — FOLIC ACID 1 MG PO TABS: 1.0000 mg | ORAL_TABLET | Freq: Every day | ORAL | Status: DC

## 2014-07-31 MED ORDER — VALACYCLOVIR HCL 500 MG PO TABS: 500.0000 mg | ORAL_TABLET | Freq: Two times a day (BID) | ORAL | Status: DC

## 2014-07-31 MED ORDER — TOLTERODINE TARTRATE ER 4 MG PO CP24: 4.0000 mg | ORAL_CAPSULE | Freq: Every day | ORAL | Status: DC

## 2014-07-31 MED ORDER — METOPROLOL SUCCINATE ER 200 MG PO TB24: 200.0000 mg | ORAL_TABLET | Freq: Every day | ORAL | Status: DC

## 2014-07-31 MED ORDER — CELECOXIB 200 MG PO CAPS: 200.0000 mg | ORAL_CAPSULE | Freq: Two times a day (BID) | ORAL | Status: DC

## 2014-07-31 MED ORDER — ATORVASTATIN CALCIUM 10 MG PO TABS: 10.0000 mg | ORAL_TABLET | Freq: Every day | ORAL | Status: DC

## 2014-07-31 MED ORDER — LOSARTAN POTASSIUM 50 MG PO TABS: 50.0000 mg | ORAL_TABLET | Freq: Every day | ORAL | Status: DC

## 2014-07-31 NOTE — Progress Notes (Signed)
Subjective:    Patient ID: Kaylee Weiss, female    DOB: [birth year], 69 y.o.   MRN: [id number] This chart was scribed for Robyn Haber, MD by Zola Button, Medical Scribe. This patient was seen in Room 25 and the patient's care was started at 1:31 PM.   HPI HPI Comments: Kaylee Weiss is a 69 y.o. female with a hx of hypertension who presents to the [facility] for a follow-up.  Patient reports having some sudden onset right elbow pain and right knee pain secondary to three falls she has had this past week. She also states she pulled a muscle in her chest. Patient denies SOB. She has unstable gait due to a "mapping disorder." It was found that she walks with her feet too close together. She has been going to a program for this.  Patient had been on folic acid which was recommended by her previous cardiologist in [place]. She is unsure if this is necessary for her. She also takes Eflornithine HCl cream to prevent facial hair.  Patient also confides that she's had intermittent back pain which has gotten worse when sitting. She's had a couple episodes of urinary incontinence without realizing it until it happened. She's also had some intermittent fecal incontinence and she feels that this is on the basis of her spinal stenosis. She had an MRI done in [facility] and wants those results reviewed.  Patient used to live in [place], [place] (38 years). However, she still has a cottage there near the [employer] in [place]. 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  Lives with husband.  Review of Systems  Respiratory: Negative for shortness of breath.   Musculoskeletal: Positive for myalgias, arthralgias and gait problem.  no palpitations No abdominal pains Bruised left elbow Incontinent at time of urine and feces (embarrassing)  She plans on having her mammogram this year done at [facility]) sees in August along with her colonoscopy.    Objective:   Physical Exam BP 141/84 mmHg   Pulse 58   Temp(Src) 98 F (36.7 C) (Oral)   Resp 16   SpO2 97%  CONSTITUTIONAL: Well developed/well nourished HEAD: Normocephalic/atraumatic EYES: EOM/PERRL ENMT: Mucous membranes moist, mucusae normal NECK: supple no meningeal signs, no thyromegaly or adenopathy, no bruits SPINE: entire spine nontender, no scoliosis CV: S1/S2 noted, no murmurs/rubs/gallops noted LUNGS: Lungs are clear to auscultation bilaterally, no apparent distress ABDOMEN: soft, nontender, no rebound or guarding GU: no cva tenderness NEURO: Pt is awake/alert, moves all extremitiesx4, reflexes normal in all 4 extrem., moves easily from chair to exam table EXTREMITIES: pulses normal, full ROM, ecchymosis right lateral epicondyle SKIN: warm, color normal.  No facial hair. PSYCH: no abnormalities of mood noted, very articulate    Assessment & Plan:   This chart was scribed in my presence and reviewed by me personally.    ICD-9-CM ICD-10-CM   1. Unstable gait 781.2 R26.81 celecoxib (CELEBREX) 200 MG capsule     Sedimentation rate     CBC with Differential/Platelet     COMPLETE METABOLIC PANEL WITH GFR     Ambulatory referral to Neurology     Vitamin B12  2. Essential hypertension 401.9 I10 metoprolol (TOPROL-XL) 200 MG 24 hr tablet     losartan (COZAAR) 50 MG tablet  3. Irritable bladder 596.89 N32.89 tolterodine (DETROL LA) 4 MG 24 hr capsule     Ambulatory referral  to Neurology  4. HSV-1 (herpes simplex virus 1) infection 054.9 B00.9 valACYclovir (VALTREX) 500 MG tablet  5. Depression 311 F32.9 buPROPion (WELLBUTRIN XL) 150 MG 24 hr tablet  6. Hyperlipidemia 272.4 E78.5 atorvastatin (LIPITOR) 10 MG tablet     COMPLETE METABOLIC PANEL WITH GFR     Lipid panel  7. Mixed incontinence 788.33 N39.46 POCT urinalysis dipstick     POCT UA - Microscopic Only     Urine culture  8. Neuropathy 355.9 G62.9  Ambulatory referral to Neurology  9. Spinal stenosis of lumbar region 724.02 M48.06 Ambulatory referral to Neurology  10. Long-term use of high-risk medication V58.69 Z79.899 TSH     Ambulatory referral to Neurology  11. H/O non anemic vitamin B12 deficiency V12.1 Z86.39      Signed, Robyn Haber, MD

## 2014-08-01 LAB — SEDIMENTATION RATE: Sed Rate: 1 mm/hr (ref 0–30)

## 2014-08-01 LAB — URINE CULTURE
Colony Count: NO GROWTH
Organism ID, Bacteria: NO GROWTH

## 2014-08-02 ENCOUNTER — Other Ambulatory Visit: Payer: Self-pay | Admitting: Family Medicine

## 2014-08-06 DIAGNOSIS — Z87898 Personal history of other specified conditions: Secondary | ICD-10-CM | POA: Diagnosis not present

## 2014-09-08 ENCOUNTER — Ambulatory Visit (INDEPENDENT_AMBULATORY_CARE_PROVIDER_SITE_OTHER): Payer: Medicare Other | Admitting: Family Medicine

## 2014-09-08 VITALS — BP 130/80 | HR 68 | Temp 97.8°F | Resp 16

## 2014-09-08 DIAGNOSIS — D1800 Hemangioma unspecified site: Secondary | ICD-10-CM

## 2014-09-08 DIAGNOSIS — N898 Other specified noninflammatory disorders of vagina: Secondary | ICD-10-CM | POA: Diagnosis not present

## 2014-09-08 NOTE — Progress Notes (Signed)
Subjective:   This chart was scribed for Reginia Forts, MD by Thea Alken, ED Scribe. This patient was seen in room 13 and the patient's care was started at 9:46 AM.  Patient ID: Kaylee Weiss, female    DOB: 1946-02-26, 69 y.o.   MRN: [id number]  HPI   Chief Complaint  Patient presents with  . Vaginal Pain    has a painful dark spot on labia/ noticed 2 months ago/ getting worse   . other    patient refused to weigh  states if she weighs at the doctors office she gains weight  . Establish Care    patient states she wants Dr Tamala Julian to be her PCP/ she has Medicare   HPI Comments: Kaylee Weiss is a 69 y.o. female who presents to the [facility] complaining of a growing external, lump on labia L. She denies itching, burning, pain and drainage from lump. This lump has been present for 2 months but has progressively grown over time. She has not applied ointments or cream to area. Pt bruises easily and develops blood blisters often due to daily ASA use. Pt rides horses regularly but has not ridden horses lately. She is on aspirin.  Pt was told in the past any labial lesion could be melanoma.  Pt is here to establish care. She is a previous patient of Dr. Joseph Art.  Past Medical History  Diagnosis Date  . Hypertension   . Fibromyalgia    Past Surgical History  Procedure Laterality Date  . Hand surgery Left   . Abdominal surgery    . Rotator cuff repair Right    Prior to Admission medications   Medication Sig Start Date End Date Taking? Authorizing Provider  aspirin 81 MG tablet Take 81 mg by mouth daily.    Historical Provider, MD  atorvastatin (LIPITOR) 10 MG tablet Take 1 tablet (10 mg total) by mouth daily. 07/31/14   Robyn Haber, MD  buPROPion (WELLBUTRIN XL) 150 MG 24 hr tablet Take 1 tablet (150 mg total) by mouth daily. 07/31/14   Robyn Haber, MD  celecoxib (CELEBREX) 200 MG capsule Take 1 capsule (200 mg total) by mouth 2 (two) times daily. 07/31/14    Robyn Haber, MD  Eflornithine HCl 13.9 % cream Apply 1 application topically 2 (two) times daily with a meal. 07/31/14   Robyn Haber, MD  losartan (COZAAR) 50 MG tablet Take 1 tablet (50 mg total) by mouth daily. 07/31/14   Robyn Haber, MD  metoprolol (TOPROL-XL) 200 MG 24 hr tablet Take 1 tablet (200 mg total) by mouth daily. 07/31/14   Robyn Haber, MD  OVER THE COUNTER MEDICATION OTC B6 100 mgm taking daily    Historical Provider, MD  OVER THE COUNTER MEDICATION OTC preservision areds taking 1 in the am/qhs    Historical Provider, MD  OVER THE COUNTER MEDICATION OTC B12-SL taking everyday    Historical Provider, MD  OVER THE COUNTER MEDICATION OTC Tylenol pm taking 2 tablets at bedtime    Historical Provider, MD  tolterodine (DETROL LA) 4 MG 24 hr capsule Take 1 capsule (4 mg total) by mouth daily. 07/31/14   Robyn Haber, MD  valACYclovir (VALTREX) 500 MG tablet Take 1 tablet (500 mg total) by mouth 2 (two) times daily. 07/31/14   Robyn Haber, MD   History   Social History  . Marital Status: Married    Spouse Name: N/A  . Number of Children: N/A  . Years of Education:  N/A   Occupational History  . Not on file.   Social History Main Topics  . Smoking status: Never Smoker   . Smokeless tobacco: Not on file  . Alcohol Use: Not on file  . Drug Use: Not on file  . Sexual Activity: Not on file   Other Topics Concern  . Not on file   Social History Narrative    Review of Systems  Constitutional: Negative for fever, chills, diaphoresis and fatigue.  Genitourinary: Negative for dysuria, urgency, vaginal bleeding, vaginal discharge, vaginal pain and pelvic pain.  Skin: Positive for color change. Negative for pallor, rash and wound.  Neurological: Negative for numbness.  Hematological: Bruises/bleeds easily.    Objective:   Physical Exam  Constitutional: She is oriented to person, place, and time. She appears well-developed and well-nourished. No distress.    HENT:  Head: Normocephalic and atraumatic.  Eyes: Conjunctivae and EOM are normal.  Neck: Neck supple.  Cardiovascular: Normal rate.   Pulmonary/Chest: Effort normal.  Genitourinary: Vagina normal.    There is no rash, tenderness or lesion on the right labia. There is no rash, tenderness or lesion on the left labia.  Left labia scattered vascular angioma like areas x 5. Right labia major angioma changes without redness or tenderness.   Musculoskeletal: Normal range of motion.  Neurological: She is alert and oriented to person, place, and time.  Skin: Skin is warm and dry.  Psychiatric: She has a normal mood and affect. Her behavior is normal.  Nursing note and vitals reviewed.    Filed Vitals:   09/08/14 0943  BP: 130/80  Pulse: 68  Temp: 97.8 F (36.6 C)  Resp: 16  SpO2: 98%   Assessment & Plan:   1. Angioma, venous   2. Vaginal lesion    -New. -Consistent with new onset angiomas versus varicosities.   -No evidence of melanoma. -Reassurance provided.   No orders of the defined types were placed in this encounter.   I personally performed the services described in this documentation, which was scribed in my presence. The recorded information has been reviewed and considered.  Kristi Elayne Guerin, M.D. [facility] [address] [place],   [phone] phone [phone] fax

## 2014-09-08 NOTE — Patient Instructions (Signed)
1.  You have angiomas present that can develop with age.  These may enlarge or may remain the same over time.  Please return if the area becomes painful or significantly enlarges.  I do not see evidence of melanoma.  Cherry Angioma Cherry angiomas are noncancerous (benign) skin growths. They are made up of a clump of blood vessels. They occur most often in people over the age of 28. CAUSES  The cause of these skin growths is unknown, but they appear to run in families. SYMPTOMS  Cherry angiomas are smooth, round, red bumps on the skin. They can be as small as a pinhead or as big as a pencil eraser. The color may darken to a purplish red over time. Cherry angiomas are usually found on the trunk, but they can occur anywhere on the body. They are painless, but they may bleed if they are injured. The bleeding is not serious and will stop when firm pressure is applied. DIAGNOSIS  Your health care provider can usually tell what is wrong by performing a physical exam. A tissue sample (biopsy) may also be taken and examined under a microscope. TREATMENT  Usually no treatment is needed for cherry angiomas. They may be removed for improved appearance (cosmetic) reasons. Sometimes, cherry angiomas come back after removal. Removal methods include:  Electrocautery. Heat is used to burn the growth off the skin.  Cryosurgery. Liquid nitrogen is applied to the growth to freeze it. The growth eventually falls off the skin.  Surgery. HOME CARE INSTRUCTIONS  If your skin was covered with a bandage, change and remove the bandage as directed by your health care provider.  Check your skin regularly for any changes. SEEK MEDICAL CARE IF: You notice any changes or new growths on your skin. Document Released: 05/02/2001 Document Revised: 07/08/2013 Document Reviewed: 04/01/2011 [facility] Patient Information 2015 [place], [place]. This information is not intended to replace advice given to you by your health care  provider. Make sure you discuss any questions you have with your health care provider.

## 2014-09-30 ENCOUNTER — Other Ambulatory Visit: Payer: Self-pay

## 2014-09-30 DIAGNOSIS — I10 Essential (primary) hypertension: Secondary | ICD-10-CM

## 2014-09-30 MED ORDER — LOSARTAN POTASSIUM 50 MG PO TABS: 50.0000 mg | ORAL_TABLET | Freq: Every day | ORAL | Status: DC

## 2014-10-17 DIAGNOSIS — M47817 Spondylosis without myelopathy or radiculopathy, lumbosacral region: Secondary | ICD-10-CM | POA: Diagnosis not present

## 2014-10-17 DIAGNOSIS — M545 Low back pain: Secondary | ICD-10-CM | POA: Diagnosis not present

## 2014-10-17 DIAGNOSIS — M797 Fibromyalgia: Secondary | ICD-10-CM | POA: Diagnosis not present

## 2014-10-17 DIAGNOSIS — I209 Angina pectoris, unspecified: Secondary | ICD-10-CM | POA: Diagnosis not present

## 2014-10-17 DIAGNOSIS — M159 Polyosteoarthritis, unspecified: Secondary | ICD-10-CM | POA: Diagnosis not present

## 2014-10-17 DIAGNOSIS — Z1231 Encounter for screening mammogram for malignant neoplasm of breast: Secondary | ICD-10-CM | POA: Diagnosis not present

## 2014-10-17 DIAGNOSIS — M47812 Spondylosis without myelopathy or radiculopathy, cervical region: Secondary | ICD-10-CM | POA: Diagnosis not present

## 2014-10-17 DIAGNOSIS — G479 Sleep disorder, unspecified: Secondary | ICD-10-CM | POA: Diagnosis not present

## 2014-10-17 DIAGNOSIS — I2510 Atherosclerotic heart disease of native coronary artery without angina pectoris: Secondary | ICD-10-CM | POA: Diagnosis not present

## 2014-10-29 DIAGNOSIS — M4806 Spinal stenosis, lumbar region: Secondary | ICD-10-CM | POA: Diagnosis not present

## 2014-10-29 DIAGNOSIS — M549 Dorsalgia, unspecified: Secondary | ICD-10-CM | POA: Diagnosis not present

## 2014-10-29 DIAGNOSIS — R159 Full incontinence of feces: Secondary | ICD-10-CM | POA: Diagnosis not present

## 2014-10-29 DIAGNOSIS — G834 Cauda equina syndrome: Secondary | ICD-10-CM | POA: Diagnosis present

## 2014-10-29 DIAGNOSIS — R32 Unspecified urinary incontinence: Secondary | ICD-10-CM | POA: Diagnosis not present

## 2014-10-29 DIAGNOSIS — Z86711 Personal history of pulmonary embolism: Secondary | ICD-10-CM | POA: Diagnosis not present

## 2014-10-29 DIAGNOSIS — F329 Major depressive disorder, single episode, unspecified: Secondary | ICD-10-CM | POA: Diagnosis present

## 2014-10-29 DIAGNOSIS — I700 Atherosclerosis of aorta: Secondary | ICD-10-CM | POA: Diagnosis present

## 2014-10-29 DIAGNOSIS — K8020 Calculus of gallbladder without cholecystitis without obstruction: Secondary | ICD-10-CM | POA: Diagnosis not present

## 2014-10-29 DIAGNOSIS — Z7982 Long term (current) use of aspirin: Secondary | ICD-10-CM | POA: Diagnosis not present

## 2014-10-29 DIAGNOSIS — M5441 Lumbago with sciatica, right side: Secondary | ICD-10-CM | POA: Diagnosis not present

## 2014-10-29 DIAGNOSIS — M5136 Other intervertebral disc degeneration, lumbar region: Secondary | ICD-10-CM | POA: Diagnosis not present

## 2014-10-29 DIAGNOSIS — F419 Anxiety disorder, unspecified: Secondary | ICD-10-CM | POA: Diagnosis present

## 2014-10-29 DIAGNOSIS — G8929 Other chronic pain: Secondary | ICD-10-CM | POA: Diagnosis not present

## 2014-10-29 DIAGNOSIS — M4802 Spinal stenosis, cervical region: Secondary | ICD-10-CM | POA: Diagnosis present

## 2014-10-29 DIAGNOSIS — I10 Essential (primary) hypertension: Secondary | ICD-10-CM | POA: Diagnosis present

## 2014-10-29 DIAGNOSIS — M5442 Lumbago with sciatica, left side: Secondary | ICD-10-CM | POA: Diagnosis not present

## 2014-10-29 DIAGNOSIS — R202 Paresthesia of skin: Secondary | ICD-10-CM | POA: Diagnosis not present

## 2014-10-29 DIAGNOSIS — M545 Low back pain: Secondary | ICD-10-CM | POA: Diagnosis not present

## 2014-10-30 DIAGNOSIS — M4806 Spinal stenosis, lumbar region: Secondary | ICD-10-CM | POA: Diagnosis not present

## 2014-10-30 DIAGNOSIS — I10 Essential (primary) hypertension: Secondary | ICD-10-CM | POA: Diagnosis not present

## 2014-10-30 DIAGNOSIS — F329 Major depressive disorder, single episode, unspecified: Secondary | ICD-10-CM | POA: Diagnosis not present

## 2014-10-30 DIAGNOSIS — F419 Anxiety disorder, unspecified: Secondary | ICD-10-CM | POA: Diagnosis not present

## 2014-10-30 DIAGNOSIS — G834 Cauda equina syndrome: Secondary | ICD-10-CM | POA: Insufficient documentation

## 2014-10-30 DIAGNOSIS — M4802 Spinal stenosis, cervical region: Secondary | ICD-10-CM | POA: Diagnosis not present

## 2014-11-04 ENCOUNTER — Telehealth: Payer: Self-pay | Admitting: Family Medicine

## 2014-11-04 NOTE — Telephone Encounter (Signed)
Kaylee Weiss came in saying she's unable to urinate. She said she only feels "dribble" and she's unable to know if she's emptied her bladder. She'd like a phone call regarding this today if possible.  In addition, Kaylee Weiss came in with her Medical Records from [facility]. She has Acute Stenosis of L3, 4, and 5. She was supposed to have surgery regarding this but the surgeon felt she had Chronic Stenosis and wouldn't perform the surgery per the pt. Kaylee Weiss also said the Neurologist from [facility] knows her condition is Acute. She'd like Dr. Tamala Julian to review her Medical Records and refer her to a new Neurologist with [facility] or [facility].   Please call the pt at: [phone] or [phone] Thank you.  P.S. Kaylee Weiss also has records from Dr. Diona Foley (Neurologist) at [facility] in [facility] as well if you need those records.

## 2014-11-06 ENCOUNTER — Telehealth: Payer: Self-pay | Admitting: Family Medicine

## 2014-11-06 NOTE — Telephone Encounter (Signed)
Left message with husband for wife to give [place] a call back patient was on the phone at the time i called find out whats going on with patient

## 2014-11-07 ENCOUNTER — Encounter: Payer: Self-pay | Admitting: Family Medicine

## 2014-11-07 ENCOUNTER — Ambulatory Visit (INDEPENDENT_AMBULATORY_CARE_PROVIDER_SITE_OTHER): Payer: Medicare Other | Admitting: Family Medicine

## 2014-11-07 VITALS — BP 159/93 | HR 56 | Temp 97.9°F | Resp 16 | Ht 65.0 in

## 2014-11-07 DIAGNOSIS — E785 Hyperlipidemia, unspecified: Secondary | ICD-10-CM | POA: Diagnosis not present

## 2014-11-07 DIAGNOSIS — G834 Cauda equina syndrome: Secondary | ICD-10-CM | POA: Diagnosis not present

## 2014-11-07 DIAGNOSIS — M4807 Spinal stenosis, lumbosacral region: Secondary | ICD-10-CM

## 2014-11-07 DIAGNOSIS — Z23 Encounter for immunization: Secondary | ICD-10-CM

## 2014-11-07 DIAGNOSIS — Z01818 Encounter for other preprocedural examination: Secondary | ICD-10-CM

## 2014-11-07 DIAGNOSIS — I10 Essential (primary) hypertension: Secondary | ICD-10-CM | POA: Diagnosis not present

## 2014-11-07 NOTE — Progress Notes (Signed)
Subjective:    Patient ID: Kaylee Weiss, female    DOB: 05-14-1945, 69 y.o.   MRN: [id number]  11/07/2014  Annual Exam and Referral   HPI This 69 y.o. female presents for [place] from recent hospitalization at [facility] for [place].  Suffered two falls in past six months; referred to neurology by Dr. Joseph Art for unsteady gait and worsening lower back pain with intermittent urinary and fecal incontinence.  Neurology recommended STAT MRI by neurology last week; underwent MRI that day of neurology appointment.  Neurology called and advised for pt to present ED.  Admitted to neurology service at [facility].  NS presented for consultation; then underwent extensive work up with additional xrays.  Neurology was excellent at [facility]  Then, NS   Felt symptoms chronic nature; recommended returning in 2 weeks for follow-up; never seen by attending physician.  Has appointment on 11-17-14 but not returning; needing NS at [facility].  Onset of rectal paresthesias around 02/2014.  Had just moved.  Incontinence worsened and changed a few months later.  No urge to urinate most days.  Dribbling intermittent now; this started last week in past 3-4 days.  Rectal tone in hospital is fine.  No urge to have bowel movement; unable to express stool and then oozes for hours; onset of fecal incontinence 2 months ago; less in the past two days. No urge to have bowel movement.  Walking 2.5 miles every day.  Dropped off records on Monday.  Also went to Dr. Diona Foley at [facility].  Stopped Detrol.   Urinary hesitancy has been better in the past 24 hours.  Does not process pain like normal people; for pt to have pain is really exceptional.  Runs consistent 1-2 lower back pain but is chronic; does not limit activity; gardens a lot.  Laying down and getting up is really challenging.  Chronic back pain for 30 years.     Review of Systems  Constitutional: Negative for fever, chills, diaphoresis, activity  change, appetite change, fatigue and unexpected weight change.  HENT: Negative for congestion, dental problem, drooling, ear discharge, ear pain, facial swelling, hearing loss, mouth sores, nosebleeds, postnasal drip, rhinorrhea, sinus pressure, sneezing, sore throat, tinnitus, trouble swallowing and voice change.   Eyes: Negative for photophobia, pain, discharge, redness, itching and visual disturbance.  Respiratory: Negative for apnea, cough, choking, chest tightness, shortness of breath, wheezing and stridor.   Cardiovascular: Negative for chest pain, palpitations and leg swelling.  Gastrointestinal: Negative for nausea, vomiting, abdominal pain, diarrhea, constipation, blood in stool, abdominal distention, anal bleeding and rectal pain.       Intermittent fecal incontinence  Endocrine: Negative for cold intolerance, heat intolerance, polydipsia, polyphagia and polyuria.  Genitourinary: Positive for decreased urine volume and difficulty urinating. Negative for dysuria, urgency, frequency, hematuria, flank pain, vaginal bleeding, vaginal discharge, enuresis, genital sores, vaginal pain, menstrual problem, pelvic pain and dyspareunia.  Musculoskeletal: Positive for back pain and gait problem. Negative for myalgias, joint swelling, arthralgias, neck pain and neck stiffness.  Skin: Negative for color change, pallor, rash and wound.  Allergic/Immunologic: Negative for environmental allergies, food allergies and immunocompromised state.  Neurological: Negative for dizziness, tremors, seizures, syncope, facial asymmetry, speech difficulty, weakness, light-headedness, numbness and headaches.  Hematological: Negative for adenopathy. Does not bruise/bleed easily.  Psychiatric/Behavioral: Negative for suicidal ideas, hallucinations, behavioral problems, confusion, sleep disturbance, self-injury, dysphoric mood, decreased concentration and agitation. The patient is not nervous/anxious and is not hyperactive.  Past Medical History  Diagnosis Date  . Hypertension   . Fibromyalgia   . Arthritis    Past Surgical History  Procedure Laterality Date  . Hand surgery Left   . Abdominal surgery    . Rotator cuff repair Right    Allergies  Allergen Reactions  . Demerol [Meperidine] Anaphylaxis  . Percocet [Oxycodone-Acetaminophen] Nausea And Vomiting   Current Outpatient Prescriptions  Medication Sig Dispense Refill  . aspirin 81 MG tablet Take 81 mg by mouth daily.    Marland Kitchen atorvastatin (LIPITOR) 10 MG tablet Take 1 tablet (10 mg total) by mouth daily. 90 tablet 3  . buPROPion (WELLBUTRIN XL) 150 MG 24 hr tablet Take 1 tablet (150 mg total) by mouth daily. 90 tablet 3  . celecoxib (CELEBREX) 200 MG capsule Take 1 capsule (200 mg total) by mouth 2 (two) times daily. 180 capsule 3  . Eflornithine HCl 13.9 % cream Apply 1 application topically 2 (two) times daily with a meal. 45 g 3  . folic acid (FOLVITE) 1 MG tablet Take 1 mg by mouth daily.    Marland Kitchen losartan (COZAAR) 50 MG tablet Take 1 tablet (50 mg total) by mouth daily. 90 tablet 0  . Melatonin 5 MG CAPS Take by mouth.    . metoprolol (TOPROL-XL) 200 MG 24 hr tablet Take 1 tablet (200 mg total) by mouth daily. 90 tablet 3  . OVER THE COUNTER MEDICATION OTC B6 100 mgm taking daily    . OVER THE COUNTER MEDICATION OTC preservision areds taking 1 in the am/qhs    . OVER THE COUNTER MEDICATION OTC B12-SL taking everyday    . OVER THE COUNTER MEDICATION OTC Tylenol pm taking 2 tablets at bedtime    . valACYclovir (VALTREX) 500 MG tablet Take 1 tablet (500 mg total) by mouth 2 (two) times daily. 180 tablet 3  . tolterodine (DETROL LA) 4 MG 24 hr capsule Take 1 capsule (4 mg total) by mouth daily. (Patient not taking: Reported on 11/07/2014) 90 capsule 3   No current facility-administered medications for this visit.   Social History   Social History  . Marital Status: Married    Spouse Name: N/A  . Number of Children: N/A  . Years of Education:  N/A   Occupational History  . Not on file.   Social History Main Topics  . Smoking status: Never Smoker   . Smokeless tobacco: Not on file  . Alcohol Use: 0.0 oz/week    0 Standard drinks or equivalent per week  . Drug Use: No  . Sexual Activity: No   Other Topics Concern  . Not on file   Social History Narrative   Family History  Problem Relation Age of Onset  . Heart attack Mother   . Stroke Mother   . Diabetes Mother   . Heart disease Mother   . Hyperlipidemia Mother   . Hypertension Mother   . Diabetes Father   . Stroke Father   . Heart attack Father   . Cancer Father   . Heart disease Father   . Hyperlipidemia Father   . Hypertension Father   . Asthma Son        Objective:    BP 159/93 mmHg  Pulse 56  Temp(Src) 97.9 F (36.6 C)  Resp 16  Ht 5\' 5"  (1.651 m)  Wt  Physical Exam  Constitutional: She is oriented to person, place, and time. She appears well-developed and well-nourished. No distress.  HENT:  Head: Normocephalic  and atraumatic.  Right Ear: External ear normal.  Left Ear: External ear normal.  Nose: Nose normal.  Mouth/Throat: Oropharynx is clear and moist.  Eyes: Conjunctivae and EOM are normal. Pupils are equal, round, and reactive to light.  Neck: Normal range of motion and full passive range of motion without pain. Neck supple. No JVD present. Carotid bruit is not present. No thyromegaly present.  Cardiovascular: Normal rate, regular rhythm, normal heart sounds and intact distal pulses.  Exam reveals no gallop and no friction rub.   No murmur heard. Pulmonary/Chest: Effort normal and breath sounds normal. She has no wheezes. She has no rales.  Abdominal: Soft. Bowel sounds are normal. She exhibits no distension and no mass. There is no tenderness. There is no rebound and no guarding.  Genitourinary: Rectal exam shows anal tone normal.  Musculoskeletal:       Lumbar back: She exhibits pain. She exhibits normal range of motion, no  tenderness, no bony tenderness and no spasm.  Lymphadenopathy:    She has no cervical adenopathy.  Neurological: She is alert and oriented to person, place, and time. She has normal reflexes. No cranial nerve deficit. She exhibits normal muscle tone. Coordination normal.  Skin: Skin is warm and dry. No rash noted. She is not diaphoretic. No erythema. No pallor.  Psychiatric: She has a normal mood and affect. Her behavior is normal. Judgment and thought content normal.  Nursing note and vitals reviewed.  EKG: NSR; NO ACUTE CHANGES.  INFLUENZA VACCINE ADMINISTERED.    Assessment & Plan:   1. Cauda equina syndrome   2. Spinal stenosis of lumbosacral region   3. Preoperative clearance   4. Hyperlipidemia   5. Essential hypertension   6. Need for prophylactic vaccination and inoculation against influenza     1. Cauda Equina Syndrome:  New. S/p admission at [facility] last week.  All records reviewed in detail.  S/p NS consultation during admission that felt pt stable for discharge to follow-up in upcoming two weeks; having urinary hesitancy that is worsening in the past 3-4 days; to ED for inability to urinate.  Per patient request, refer to [facility].   2.  Severe spinal stenosis: chronic with progressive worsening; refer to [facility] NS.  Pt very active and exercises daily despite pain. 3.  Preoperative clearance: pt will warrant surgical intervention; currently asymptomatic; walking 2.44miles daily without chest pain or SOB; normal EKG in the office.  Clearance for surgery. 4.  Hyperlipidemia: stable; continue current medications. 5.  HTN: controlled; continue current medications. 6.  S/p influenza vaccine.   Orders Placed This Encounter  Procedures  . Flu Vaccine QUAD 36+ mos IM  . Ambulatory referral to Neurosurgery    Referral Priority:  Urgent    Referral Type:  Surgical    Referral Reason:  Specialty Services Required    Requested Specialty:  Neurosurgery    Number of  Visits Requested:  1  . EKG 12-Lead   Meds ordered this encounter  Medications  . folic acid (FOLVITE) 1 MG tablet    Sig: Take 1 mg by mouth daily.  . Melatonin 5 MG CAPS    Sig: Take by mouth.    Return in about 3 months (around 02/06/2015) for complete physical examiniation.  Chasity Outten Elayne Guerin, M.D. [facility] [address] [place], [place]  [address] [phone] phone [phone] fax

## 2014-11-13 ENCOUNTER — Telehealth: Payer: Self-pay

## 2014-11-13 NOTE — Telephone Encounter (Signed)
Pt spoke with Dr. Tamala Julian today.  See other message 11/13/14

## 2014-11-13 NOTE — Telephone Encounter (Signed)
It looks like a referral was placed for this. Referral dept. should have more information since they have already contacted [facility] on 11/07/14.

## 2014-11-13 NOTE — Telephone Encounter (Signed)
Spoke with patient ---- does not know what to do.  [facility] will not schedule appointment until they have reviewed all records.  Has appointment at [facility] NS tomorrow.  Needs to deliver CD of MRI lumbar spine.  Since hospital discharge, decreased sensation with bowel movements.  Does not need to take Detrol at all; no urge; no longer incontinent stress. Symptoms have progressed since hospital discharge.  A/P:  Cauda equina syndrome: worsening/progressive.  [place] NS appointment tomorrow; deliver CD of MRI to [facility].  Pt agreeable.

## 2014-11-13 NOTE — Telephone Encounter (Signed)
PATIENT WOULD LIKE DR. Tamala Julian TO GET THIS MESSAGE AS SOON AS POSSIBLE. Kaylee Weiss KNOWS HER HISTORY. SHE IS SUPPOSE TO GO TO THE NEUROSURGEON TOMORROW AT [facility]. HOWEVER, THEY HAVE NOT RECEIVED HER MEDICAL RECORDS. SHE WANTS TO KNOW IF DR. Tamala Julian STILL WANTS HER TO GO, OR  IF SHE SHOULD BE SCHEDULED TO GO TO [facility]? SHE REALLY DOES NOT WANT TO GO TO [facility], BUT HER SYMPTOMS ARE GETTING WORSE. SHE WOULD LIKE TO KNOW WHAT SHE SHOULD DO? BEST PHONE [phone] (HOME)  [name]

## 2014-11-22 DIAGNOSIS — M4802 Spinal stenosis, cervical region: Secondary | ICD-10-CM | POA: Diagnosis not present

## 2014-11-25 DIAGNOSIS — R32 Unspecified urinary incontinence: Secondary | ICD-10-CM | POA: Diagnosis not present

## 2014-11-25 DIAGNOSIS — M4106 Infantile idiopathic scoliosis, lumbar region: Secondary | ICD-10-CM | POA: Diagnosis not present

## 2014-11-25 DIAGNOSIS — M5136 Other intervertebral disc degeneration, lumbar region: Secondary | ICD-10-CM | POA: Diagnosis not present

## 2014-11-25 DIAGNOSIS — M4125 Other idiopathic scoliosis, thoracolumbar region: Secondary | ICD-10-CM | POA: Diagnosis not present

## 2014-11-25 DIAGNOSIS — M47816 Spondylosis without myelopathy or radiculopathy, lumbar region: Secondary | ICD-10-CM | POA: Diagnosis not present

## 2014-11-25 DIAGNOSIS — M4806 Spinal stenosis, lumbar region: Secondary | ICD-10-CM | POA: Diagnosis not present

## 2014-11-25 DIAGNOSIS — R159 Full incontinence of feces: Secondary | ICD-10-CM | POA: Diagnosis not present

## 2014-11-26 ENCOUNTER — Encounter: Payer: Medicare Other | Admitting: Family Medicine

## 2014-11-28 ENCOUNTER — Encounter: Payer: Medicare Other | Admitting: Family Medicine

## 2014-12-10 ENCOUNTER — Telehealth: Payer: Self-pay | Admitting: Radiology

## 2014-12-10 ENCOUNTER — Ambulatory Visit (INDEPENDENT_AMBULATORY_CARE_PROVIDER_SITE_OTHER): Payer: Medicare Other | Admitting: Family Medicine

## 2014-12-10 VITALS — BP 140/70 | HR 65 | Temp 98.2°F | Resp 16 | Ht 65.0 in

## 2014-12-10 DIAGNOSIS — R159 Full incontinence of feces: Secondary | ICD-10-CM | POA: Diagnosis not present

## 2014-12-10 DIAGNOSIS — G834 Cauda equina syndrome: Secondary | ICD-10-CM

## 2014-12-10 DIAGNOSIS — M4120 Other idiopathic scoliosis, site unspecified: Secondary | ICD-10-CM | POA: Diagnosis not present

## 2014-12-10 MED ORDER — DIAZEPAM 2 MG PO TABS: 2.0000 mg | ORAL_TABLET | Freq: Once | ORAL | Status: DC

## 2014-12-10 NOTE — Progress Notes (Signed)
Subjective:  This chart was scribed for Reginia Forts, MD by Thea Alken, ED Scribe. This patient was seen in room 12 and the patient's care was started at 5:30 PM.   Patient ID: Kaylee Weiss, female    DOB: 1945-05-05, 69 y.o.   MRN: [id number]  HPI   Chief Complaint  Patient presents with  . Back Pain    Will have 90 minute MRI Next week, needs something to get through it   HPI Comments: Kaylee Weiss is a 69 y.o. female who presents to the [facility] complaining of a back pain. Pt states she went to [facility] for her appointment but went on the wrong day and decided not to go back. She went to [facility] and is scheduled for a 90 minute MRI. Pt is concerned she is not going to make it through the entire MRI due to back pain. Pt is wanting medication to help with the back pain.  States Dr. Marcos Eke at [facility] believes pain is possible a GI problem is causing back pain and would like her to follow up with GI. she was told that overall there would be a non surgical procedure for her back. She feels as if she is getting discriminated against due to her age and that she is not being taken seriously. She takes 2 tylenol at night with melatonin.  Pt is unable to take percocet, states this upsets her stomach. She has had valium in the past.   Ear- pt c/o a lump in right ear that has been present for a while.   Bladder- she reports improvement with bladder. No difficulty urinating or emptying her  bladder.   Bowels incontinence- States she will have a BM but stool with ooze out for an hour.    Past Medical History  Diagnosis Date  . Hypertension   . Fibromyalgia   . Arthritis    Allergies  Allergen Reactions  . Demerol [Meperidine] Anaphylaxis  . Percocet [Oxycodone-Acetaminophen] Nausea And Vomiting   Prior to Admission medications   Medication Sig Start Date End Date Taking? Authorizing Provider  aspirin 81 MG tablet Take 81 mg by mouth daily.   Yes Historical Provider, MD    atorvastatin (LIPITOR) 10 MG tablet Take 1 tablet (10 mg total) by mouth daily. 07/31/14  Yes Robyn Haber, MD  buPROPion (WELLBUTRIN XL) 150 MG 24 hr tablet Take 1 tablet (150 mg total) by mouth daily. 07/31/14  Yes Robyn Haber, MD  celecoxib (CELEBREX) 200 MG capsule Take 1 capsule (200 mg total) by mouth 2 (two) times daily. 07/31/14  Yes Robyn Haber, MD  Eflornithine HCl 13.9 % cream Apply 1 application topically 2 (two) times daily with a meal. 07/31/14  Yes Robyn Haber, MD  folic acid (FOLVITE) 1 MG tablet Take 1 mg by mouth daily.   Yes Historical Provider, MD  losartan (COZAAR) 50 MG tablet Take 1 tablet (50 mg total) by mouth daily. 09/30/14  Yes Robyn Haber, MD  Melatonin 5 MG CAPS Take by mouth.   Yes Historical Provider, MD  metoprolol (TOPROL-XL) 200 MG 24 hr tablet Take 1 tablet (200 mg total) by mouth daily. 07/31/14  Yes Robyn Haber, MD  OVER THE COUNTER MEDICATION OTC B6 100 mgm taking daily   Yes Historical Provider, MD  OVER THE COUNTER MEDICATION OTC preservision areds taking 1 in the am/qhs   Yes Historical Provider, MD  OVER THE COUNTER MEDICATION OTC B12-SL taking everyday   Yes Historical Provider,  MD  OVER THE COUNTER MEDICATION OTC Tylenol pm taking 2 tablets at bedtime   Yes Historical Provider, MD  valACYclovir (VALTREX) 500 MG tablet Take 1 tablet (500 mg total) by mouth 2 (two) times daily. 07/31/14  Yes Robyn Haber, MD  tolterodine (DETROL LA) 4 MG 24 hr capsule Take 1 capsule (4 mg total) by mouth daily. Patient not taking: Reported on 11/07/2014 07/31/14   Robyn Haber, MD   Review of Systems  HENT: Negative for ear discharge and ear pain.   Musculoskeletal: Positive for back pain.   Objective:   Physical Exam  Constitutional: She is oriented to person, place, and time. She appears well-developed and well-nourished. No distress.  HENT:  Head: Normocephalic and atraumatic.  Right ear- white pustule  Eyes: Conjunctivae and EOM are normal.   Neck: Neck supple.  Cardiovascular: Normal rate.   Pulmonary/Chest: Effort normal.  Musculoskeletal: Normal range of motion.  Neurological: She is alert and oriented to person, place, and time.  Skin: Skin is warm and dry.  Psychiatric: She has a normal mood and affect. Her behavior is normal.  Nursing note and vitals reviewed.  Filed Vitals:   12/10/14 1647  BP: 140/70  Pulse: 65  Temp: 98.2 F (36.8 C)  TempSrc: Oral  Resp: 16  Height: 5\' 5"  (1.651 m)  SpO2: 96%    Assessment & Plan:    1. Cauda equina syndrome ([place])   2. Fecal incontinence   3. Idiopathic scoliosis      Meds ordered this encounter  Medications  . diazepam (VALIUM) 2 MG tablet    Sig: Take 1-2 tablets (2-4 mg total) by mouth once.    Dispense:  2 tablet    Refill:  0   By signing my name below, I, Raven Small, attest that this documentation has been prepared under the direction and in the presence of Reginia Forts MD.  Electronically Signed: Thea Alken, ED Scribe. 12/10/2014. 6:48 PM.   I personally performed the services described in this documentation, which was scribed in my presence. The recorded information has been reviewed and considered.  Jariya Reichow Elayne Guerin, M.D. [facility] [address] [place], [place]  [address] [phone] phone [phone] fax

## 2014-12-19 DIAGNOSIS — R32 Unspecified urinary incontinence: Secondary | ICD-10-CM | POA: Diagnosis not present

## 2014-12-19 DIAGNOSIS — R159 Full incontinence of feces: Secondary | ICD-10-CM | POA: Diagnosis not present

## 2014-12-19 DIAGNOSIS — M47816 Spondylosis without myelopathy or radiculopathy, lumbar region: Secondary | ICD-10-CM | POA: Diagnosis not present

## 2014-12-19 DIAGNOSIS — M4125 Other idiopathic scoliosis, thoracolumbar region: Secondary | ICD-10-CM | POA: Diagnosis not present

## 2014-12-19 DIAGNOSIS — M4806 Spinal stenosis, lumbar region: Secondary | ICD-10-CM | POA: Diagnosis not present

## 2015-01-01 DIAGNOSIS — M4806 Spinal stenosis, lumbar region: Secondary | ICD-10-CM | POA: Diagnosis not present

## 2015-01-07 DIAGNOSIS — M4806 Spinal stenosis, lumbar region: Secondary | ICD-10-CM | POA: Diagnosis not present

## 2015-01-07 DIAGNOSIS — M5416 Radiculopathy, lumbar region: Secondary | ICD-10-CM | POA: Diagnosis not present

## 2015-01-30 ENCOUNTER — Other Ambulatory Visit: Payer: Self-pay | Admitting: Family Medicine

## 2015-02-11 DIAGNOSIS — M4806 Spinal stenosis, lumbar region: Secondary | ICD-10-CM | POA: Diagnosis not present

## 2015-02-11 DIAGNOSIS — M5416 Radiculopathy, lumbar region: Secondary | ICD-10-CM | POA: Diagnosis not present

## 2015-02-16 ENCOUNTER — Ambulatory Visit: Payer: Medicare Other | Admitting: Family Medicine

## 2015-02-17 ENCOUNTER — Encounter: Payer: Self-pay | Admitting: Family Medicine

## 2015-02-17 ENCOUNTER — Ambulatory Visit (INDEPENDENT_AMBULATORY_CARE_PROVIDER_SITE_OTHER): Payer: Medicare Other | Admitting: Family Medicine

## 2015-02-17 VITALS — BP 120/74 | HR 57 | Temp 98.2°F | Resp 16 | Ht 65.5 in | Wt 145.0 lb

## 2015-02-17 DIAGNOSIS — G834 Cauda equina syndrome: Secondary | ICD-10-CM

## 2015-02-17 DIAGNOSIS — N3941 Urge incontinence: Secondary | ICD-10-CM

## 2015-02-17 DIAGNOSIS — N3289 Other specified disorders of bladder: Secondary | ICD-10-CM | POA: Diagnosis not present

## 2015-02-17 DIAGNOSIS — I10 Essential (primary) hypertension: Secondary | ICD-10-CM | POA: Diagnosis not present

## 2015-02-17 DIAGNOSIS — M797 Fibromyalgia: Secondary | ICD-10-CM

## 2015-02-17 LAB — POCT URINALYSIS DIP (MANUAL ENTRY)
Bilirubin, UA: NEGATIVE
Glucose, UA: NEGATIVE
Ketones, POC UA: NEGATIVE
LEUKOCYTES UA: NEGATIVE
NITRITE UA: NEGATIVE
PH UA: 5
PROTEIN UA: NEGATIVE
RBC UA: NEGATIVE
Spec Grav, UA: >=1.03
UROBILINOGEN UA: 0.2

## 2015-02-17 MED ORDER — TOLTERODINE TARTRATE ER 2 MG PO CP24: 2.0000 mg | ORAL_CAPSULE | Freq: Every day | ORAL | Status: DC

## 2015-02-17 NOTE — Progress Notes (Signed)
Subjective:    Patient ID: Kaylee Weiss, female    DOB: 1945/12/23, 69 y.o.   MRN: [id number]  02/17/2015  Follow-up   HPI This 69 y.o. female presents for follow-up evaluation for cauda equina syndrome.  S/p consultation by NS and neurology consultation at [facility].   S/p follow-up with NS after repeat MRI at [facility]; offered injections, two other surgical options.  Pt asked what chances were to end up in wheelchair.  NS did not recommend surgery; too complicated.  Too complex of surgical correction.  Thrilled with news; recommended injections in back.  Husband was complaining of arm pain due to building green house for patient; thus, patient started shoveling sand into foundation into greenhouse.  Last week suffered with ED pain severe pain at 11:30pm.   Rheumatologist really knows patient; has always prescribed Percocet for patient; took two Percocet and Ambien with no relief until 5:00am.  Pain was located in lower back.  Started easing up and slept for 2-3 hours.  Woke up and pain was resolved.  Incontinence improved for one week afterwards.  Then when shoveled sand last week, pain reoccurred.  Emailed NS/[facility] regarding pain after shoveling.  Lifting really aggravates pain.  All pain started after move.  NS emailed back and advised NOT to shovel.  Received injection but unable to get into back at location where requested due to narrowing of vertebra.  Tried three injections and only successfully injected once; knee pain resolved.  Cira Servant performed injections.  Scheduled for injections every three months with [facility].  Knee pain is starting to recur with prolonged standing.  Still walking five miles per day.  Not lifting anything. Lifted 25 pound [place] and was too heavy.  Had to take Percocet once since injection.  Feeling back pain more; pain in knee was really severe.   Now noticing back pain more.  Previous back injection once before; this procedure with fluoroscopy and face down and in  trendelenburg.  Previous Detrol in the past.  Still having fecal incontinence.  Really afraid of becoming constipated.  Started Detrol years ago; twenty years ago started [employer].  Then stopped it due to having urinary retention.  Has no sensation in saddle area.  Now having urge incontinence; started after shoveling in October.  Known rectocele chronic.  S/p urology evaluation in 2000.  Chronic incontinence "my whole life"  Between 03/2014 and the summer, was gushing; nothing like that before. Then suffered with retention for 3 months; now gushing more than baseline. Wearing a pad now.  Adjusting life to it.  Does not want to go to urology right now. With recent neurology consultation this week, discussed advanced directives.  Mild weakness in legs in comparison to baseline.  Working with two same people daily for walking.  Starting to have trouble keeping up with friends.  Ambien 5mg  PRN; uses rarely and mostly with pain.  Has taken 3 Percocet this year.    She reports seeing a neurosurgeon at [facility] and repeated imaging. She reports being told that they did not want to do surgery due to "the complexity of the surgery." She reports being concerned about two years of recovery, per that doctor. She reports being told the best approach at this time is conservative management. She tried back injections but says the knee injections helped the most. She reports continuing with bladder incontinence but says the bowel incontinence is not as bad. She reports numbness and tingling of the legs and pelvic region. She reports  falling about one time a month but has not had any falls since using a walking stick.   Diagnostic Studies:  MRI lumbar spine 10/29/14 report reviewed: Severe multilevel degenerative disease as described above, worse at L3-L4 where there is severe central spinal stenosis that may explain clinical cauda equina syndrome.  MRI cervical spine 11/22/14 reviewed: Multilevel spondylolisthesis changes and  posterior disc osteophyte complexes contribute to mild to moderate acquired spinal canal and foraminal stenosis as detailed above.   Fecal incontinence Urinary incontinence Saddle anesthesia lumbar spinal stenosis cervical spinal stenosis  This is a very nice female with history of insidious onset of bowel/bladder incontinence. Has history of significant spinal stenosis, back pain, and some saddle anesthesia. Patient has been evaluated by NS[facility] at [facility] , [place] and [place] with no surgery recommended at this time due to complicated and chronic nature of changes.   Plan: -patient had no improvement in bladder training. -Suggested patient see urology for any other management options, she will see her PCP for possible referral.  -patient will follow with neurosurgeon at [facility].  -can continue pain/steroid injection management with her pain doctor -informed patient to call with any new or worsening symptoms  * Return in about 1 year (around 02/11/2016).   Review of Systems  Constitutional: Negative for fever, chills, diaphoresis and fatigue.  Eyes: Negative for visual disturbance.  Respiratory: Negative for cough and shortness of breath.   Cardiovascular: Negative for chest pain, palpitations and leg swelling.  Gastrointestinal: Negative for nausea, vomiting, abdominal pain, diarrhea and constipation.  Endocrine: Negative for cold intolerance, heat intolerance, polydipsia, polyphagia and polyuria.  Genitourinary: Positive for urgency, frequency and difficulty urinating. Negative for dysuria, hematuria, vaginal bleeding, vaginal discharge, vaginal pain and pelvic pain.  Musculoskeletal: Positive for myalgias and back pain.  Neurological: Positive for weakness and numbness. Negative for dizziness, tremors, seizures, syncope, facial asymmetry, speech difficulty, light-headedness and headaches.    Past Medical History  Diagnosis Date  . Hypertension   . Fibromyalgia   . Arthritis    Past  Surgical History  Procedure Laterality Date  . Hand surgery Left   . Abdominal surgery    . Rotator cuff repair Right    Allergies  Allergen Reactions  . Demerol [Meperidine] Anaphylaxis  . Percocet [Oxycodone-Acetaminophen] Nausea And Vomiting   Current Outpatient Prescriptions  Medication Sig Dispense Refill  . aspirin 81 MG tablet Take 81 mg by mouth daily.    Marland Kitchen atorvastatin (LIPITOR) 10 MG tablet Take 1 tablet (10 mg total) by mouth daily. 90 tablet 3  . buPROPion (WELLBUTRIN XL) 150 MG 24 hr tablet Take 1 tablet (150 mg total) by mouth daily. 90 tablet 3  . celecoxib (CELEBREX) 200 MG capsule Take 1 capsule (200 mg total) by mouth 2 (two) times daily. 180 capsule 3  . Eflornithine HCl 13.9 % cream Apply 1 application topically 2 (two) times daily with a meal. 45 g 3  . folic acid (FOLVITE) 1 MG tablet Take 1 mg by mouth daily.    Marland Kitchen losartan (COZAAR) 50 MG tablet TAKE 1 TABLET BY MOUTH ONCE DAILY   "OFFICE VISIT NEEDED FOR REFILLS" 90 tablet 0  . Melatonin 5 MG CAPS Take by mouth.    . metoprolol (TOPROL-XL) 200 MG 24 hr tablet Take 1 tablet (200 mg total) by mouth daily. 90 tablet 3  . OVER THE COUNTER MEDICATION OTC B6 100 mgm taking daily    . OVER THE COUNTER MEDICATION OTC preservision areds taking 1 in  the am/qhs    . OVER THE COUNTER MEDICATION OTC B12-SL taking everyday    . OVER THE COUNTER MEDICATION OTC Tylenol pm taking 2 tablets at bedtime    . valACYclovir (VALTREX) 500 MG tablet Take 1 tablet (500 mg total) by mouth 2 (two) times daily. 180 tablet 3  . diazepam (VALIUM) 2 MG tablet Take 1-2 tablets (2-4 mg total) by mouth once. 2 tablet 0  . oxyCODONE-acetaminophen (PERCOCET/ROXICET) 5-325 MG tablet Take 1 tablet by mouth every 4 (four) hours as needed for severe pain. 20 tablet 0  . tolterodine (DETROL LA) 2 MG 24 hr capsule Take 1 capsule (2 mg total) by mouth daily. 90 capsule 3   No current facility-administered medications for this visit.   Social History    Social History  . Marital Status: Married    Spouse Name: N/A  . Number of Children: N/A  . Years of Education: N/A   Occupational History  . Not on file.   Social History Main Topics  . Smoking status: Never Smoker   . Smokeless tobacco: Not on file  . Alcohol Use: 0.0 oz/week    0 Standard drinks or equivalent per week  . Drug Use: No  . Sexual Activity: No   Other Topics Concern  . Not on file   Social History Narrative   Family History  Problem Relation Age of Onset  . Heart attack Mother   . Stroke Mother   . Diabetes Mother   . Heart disease Mother   . Hyperlipidemia Mother   . Hypertension Mother   . Diabetes Father   . Stroke Father   . Heart attack Father   . Cancer Father   . Heart disease Father   . Hyperlipidemia Father   . Hypertension Father   . Asthma Son        Objective:    BP 120/74 mmHg  Pulse 57  Temp(Src) 98.2 F (36.8 C) (Oral)  Resp 16  Ht 5' 5.5" (1.664 m)  Wt 145 lb (65.772 kg)  BMI 23.75 kg/m2  SpO2 97% Physical Exam  Constitutional: She is oriented to person, place, and time. She appears well-developed and well-nourished. No distress.  HENT:  Head: Normocephalic and atraumatic.  Right Ear: External ear normal.  Left Ear: External ear normal.  Nose: Nose normal.  Mouth/Throat: Oropharynx is clear and moist.  Eyes: Conjunctivae and EOM are normal. Pupils are equal, round, and reactive to light.  Neck: Normal range of motion. Neck supple. Carotid bruit is not present. No thyromegaly present.  Cardiovascular: Normal rate, regular rhythm, normal heart sounds and intact distal pulses.  Exam reveals no gallop and no friction rub.   No murmur heard. Pulmonary/Chest: Effort normal and breath sounds normal. She has no wheezes. She has no rales.  Abdominal: Soft. Bowel sounds are normal. She exhibits no distension and no mass. There is no tenderness. There is no rebound and no guarding.  Musculoskeletal:       Lumbar back: She  exhibits pain and spasm. She exhibits no tenderness, no bony tenderness and normal pulse.  Lymphadenopathy:    She has no cervical adenopathy.  Neurological: She is alert and oriented to person, place, and time. No cranial nerve deficit.  Skin: Skin is warm and dry. No rash noted. She is not diaphoretic. No erythema. No pallor.  Psychiatric: She has a normal mood and affect. Her behavior is normal.   Results for orders placed or performed in visit on  02/17/15  Urine culture  Result Value Ref Range   Colony Count NO GROWTH    Organism ID, Bacteria NO GROWTH   POCT urinalysis dipstick  Result Value Ref Range   Color, UA yellow yellow   Clarity, UA clear clear   Glucose, UA negative negative   Bilirubin, UA negative negative   Ketones, POC UA negative negative   Spec Grav, UA >=1.030    Blood, UA negative negative   pH, UA 5.0    Protein Ur, POC negative negative   Urobilinogen, UA 0.2    Nitrite, UA Negative Negative   Leukocytes, UA Negative Negative       Assessment & Plan:   1. Urge incontinence   2. Irritable bladder   3. Essential hypertension   4. Fibromyalgia   5. Cauda equina syndrome ([place])     Orders Placed This Encounter  Procedures  . Urine culture  . POCT urinalysis dipstick   Meds ordered this encounter  Medications  . DISCONTD: oxyCODONE-acetaminophen (PERCOCET/ROXICET) 5-325 MG tablet    Sig: Take by mouth every 4 (four) hours as needed for severe pain.  Marland Kitchen tolterodine (DETROL LA) 2 MG 24 hr capsule    Sig: Take 1 capsule (2 mg total) by mouth daily.    Dispense:  90 capsule    Refill:  3    No Follow-up on file.    Jaryan Chicoine Elayne Guerin, M.D. [facility] [address] [place], [place]  [address] [phone] phone [phone] fax

## 2015-02-19 LAB — URINE CULTURE
COLONY COUNT: NO GROWTH
ORGANISM ID, BACTERIA: NO GROWTH

## 2015-02-20 DIAGNOSIS — D225 Melanocytic nevi of trunk: Secondary | ICD-10-CM | POA: Diagnosis not present

## 2015-02-20 DIAGNOSIS — L570 Actinic keratosis: Secondary | ICD-10-CM | POA: Diagnosis not present

## 2015-02-20 DIAGNOSIS — D1801 Hemangioma of skin and subcutaneous tissue: Secondary | ICD-10-CM | POA: Diagnosis not present

## 2015-02-20 DIAGNOSIS — B009 Herpesviral infection, unspecified: Secondary | ICD-10-CM | POA: Diagnosis not present

## 2015-02-20 DIAGNOSIS — L821 Other seborrheic keratosis: Secondary | ICD-10-CM | POA: Diagnosis not present

## 2015-02-27 ENCOUNTER — Ambulatory Visit (INDEPENDENT_AMBULATORY_CARE_PROVIDER_SITE_OTHER): Payer: Medicare Other | Admitting: Family Medicine

## 2015-02-27 ENCOUNTER — Ambulatory Visit (INDEPENDENT_AMBULATORY_CARE_PROVIDER_SITE_OTHER): Payer: Medicare Other

## 2015-02-27 VITALS — BP 138/76 | HR 68 | Temp 98.1°F | Resp 18

## 2015-02-27 DIAGNOSIS — S99912A Unspecified injury of left ankle, initial encounter: Secondary | ICD-10-CM

## 2015-02-27 DIAGNOSIS — R2681 Unsteadiness on feet: Secondary | ICD-10-CM

## 2015-02-27 DIAGNOSIS — S8262XA Displaced fracture of lateral malleolus of left fibula, initial encounter for closed fracture: Secondary | ICD-10-CM

## 2015-02-27 MED ORDER — OXYCODONE-ACETAMINOPHEN 5-325 MG PO TABS: 1.0000 | ORAL_TABLET | ORAL | Status: DC | PRN

## 2015-02-27 NOTE — Progress Notes (Signed)
Subjective:  By signing my name below, I, Rawaa Al Rifaie, attest that this documentation has been prepared under the direction and in the presence of Delman Cheadle, MD.  Leandra Kern, Medical Scribe. 02/27/2015.  4:43 PM.   Patient ID: Kaylee Weiss, female    DOB: 1945-03-23, 69 y.o.   MRN: [id number]  Chief Complaint  Patient presents with  . Ankle Injury    left, today, fall    HPI HPI Comments: Kaylee Weiss is a 69 y.o. female who presents to [facility] complaining of a left ankle injury secondary to a fall that occurred today.  Pt notes that tripped on an object and fell into a pavement with most of the impact on the ankle. She notes that she did not feel like she twisted the area, and indicates that she was ambulatory after the incident, and is currently able to move her ankle. She notes swelling of the area. Pt also reports symptoms of numbness and tingling sensation of the right foot, she indicates that she is not sure if this is associated with her hx of severe chronic back pain or due to the incident. She states that she did experience similar symptoms intermittently previous to the accident, however nothing directly preceding the injury. Pt applied ice on the area, however she did not take any medications. Pt reports no history of previous ankle injury.    Patient Active Problem List   Diagnosis Date Noted  . Unstable gait 02/12/2014  . Urinary incontinence 02/12/2014  . Essential hypertension 02/12/2014   Past Medical History  Diagnosis Date  . Hypertension   . Fibromyalgia   . Arthritis    Past Surgical History  Procedure Laterality Date  . Hand surgery Left   . Abdominal surgery    . Rotator cuff repair Right    Allergies  Allergen Reactions  . Demerol [Meperidine] Anaphylaxis  . Percocet [Oxycodone-Acetaminophen] Nausea And Vomiting   Prior to Admission medications   Medication Sig Start Date End Date Taking? Authorizing Provider    aspirin 81 MG tablet Take 81 mg by mouth daily.   Yes Historical Provider, MD  atorvastatin (LIPITOR) 10 MG tablet Take 1 tablet (10 mg total) by mouth daily. 07/31/14  Yes Robyn Haber, MD  buPROPion (WELLBUTRIN XL) 150 MG 24 hr tablet Take 1 tablet (150 mg total) by mouth daily. 07/31/14  Yes Robyn Haber, MD  celecoxib (CELEBREX) 200 MG capsule Take 1 capsule (200 mg total) by mouth 2 (two) times daily. 07/31/14  Yes Robyn Haber, MD  diazepam (VALIUM) 2 MG tablet Take 1-2 tablets (2-4 mg total) by mouth once. 12/10/14  Yes Wardell Honour, MD  Eflornithine HCl 13.9 % cream Apply 1 application topically 2 (two) times daily with a meal. 07/31/14  Yes Robyn Haber, MD  folic acid (FOLVITE) 1 MG tablet Take 1 mg by mouth daily.   Yes Historical Provider, MD  losartan (COZAAR) 50 MG tablet TAKE 1 TABLET BY MOUTH ONCE DAILY   "OFFICE VISIT NEEDED FOR REFILLS" 02/01/15  Yes Robyn Haber, MD  Melatonin 5 MG CAPS Take by mouth.   Yes Historical Provider, MD  metoprolol (TOPROL-XL) 200 MG 24 hr tablet Take 1 tablet (200 mg total) by mouth daily. 07/31/14  Yes Robyn Haber, MD  OVER THE COUNTER MEDICATION OTC B6 100 mgm taking daily   Yes Historical Provider, MD  OVER THE COUNTER MEDICATION OTC preservision areds taking 1 in the am/qhs   Yes  Historical Provider, MD  OVER THE COUNTER MEDICATION OTC B12-SL taking everyday   Yes Historical Provider, MD  OVER THE COUNTER MEDICATION OTC Tylenol pm taking 2 tablets at bedtime   Yes Historical Provider, MD  oxyCODONE-acetaminophen (PERCOCET/ROXICET) 5-325 MG tablet Take by mouth every 4 (four) hours as needed for severe pain.   Yes Historical Provider, MD  tolterodine (DETROL LA) 2 MG 24 hr capsule Take 1 capsule (2 mg total) by mouth daily. 02/17/15  Yes Wardell Honour, MD  valACYclovir (VALTREX) 500 MG tablet Take 1 tablet (500 mg total) by mouth 2 (two) times daily. 07/31/14  Yes Robyn Haber, MD   Social History   Social History  . Marital  Status: Married    Spouse Name: N/A  . Number of Children: N/A  . Years of Education: N/A   Occupational History  . Not on file.   Social History Main Topics  . Smoking status: Never Smoker   . Smokeless tobacco: Not on file  . Alcohol Use: 0.0 oz/week    0 Standard drinks or equivalent per week  . Drug Use: No  . Sexual Activity: No   Other Topics Concern  . Not on file   Social History Narrative    Review of Systems  Musculoskeletal: Positive for back pain, joint swelling and arthralgias.  Neurological: Positive for numbness.      Objective:   Physical Exam  Constitutional: She is oriented to person, place, and time. She appears well-developed and well-nourished. No distress.  HENT:  Head: Normocephalic and atraumatic.  Eyes: EOM are normal. Pupils are equal, round, and reactive to light.  Neck: Neck supple.  Cardiovascular: Normal rate.   Pulmonary/Chest: Effort normal.  Musculoskeletal:  Moderate movement of the ankle with flexion, extension, and lateral rotation.  Able to wiggle toes with no difficulty.  lateral malleolus is grossly swollen and extending from distal aspect wrapping around to 4 inch of midline approximately.   Neurological: She is alert and oriented to person, place, and time. No cranial nerve deficit.  Reflex Scores:      Patellar reflexes are 2+ on the right side and 2+ on the left side.      Achilles reflexes are 2+ on the right side and 2+ on the left side. Skin: Skin is warm and dry.  Psychiatric: She has a normal mood and affect. Her behavior is normal.  Nursing note and vitals reviewed.   [facility] (PRIMARY) x-ray report read by Dr. Delman Cheadle, MD: Left ankle- Transverse avulsion fracture in the lateral malleolus, non-displaced.   Dg Ankle Complete Left  02/27/2015  CLINICAL DATA:  Left ankle pain status post tripping. EXAM: LEFT ANKLE COMPLETE - 3+ VIEW COMPARISON:  None. FINDINGS: There is a transverse nondisplaced avulsion fracture off of  the lateral malleolus, with associated soft tissue swelling. No other fractures are seen. Osseous mineralization is normal. IMPRESSION: Nondisplaced avulsion fracture of the lateral malleolus. Electronically Signed   By: Fidela Salisbury M.D.   On: 02/27/2015 17:12     BP 138/76 mmHg  Pulse 68  Temp(Src) 98.1 F (36.7 C)  Resp 18  SpO2 97%     Assessment & Plan:     1. Left ankle injury, initial encounter   2. Lateral malleolar fracture, left, closed, initial encounter   3. Unstable gait   Placed in gel splint. Use crutches until you are able to walk with a normal gait. Cont RICE, prn nsaids/tylenol.  Start ankle ROM exercises as soon  as tolerated to maintain it.  F/u for fracture care per the Fracture Management for Primary Care Book suggests: Pt should be seen in 10-14 days to ensure compliance with fx care, and that alignment remains acceptable. Pt has appt sched w/ her PCP Dr. Tamala Julian in <2 wks for CPE so ok to recheck then as long as she is doing well.  At 4 week F/U, if fx side is non tender, repeat X-ray, and begin more aggressive rehab. If the pt is still having tenderness in 4-6 week after injury, or no callus on X-ray, she should be placed into a walking boot, with daily ROM exercises and again repeat X-ray at 8 weeks -  if still not healed, then refer to Ortho.   Orders Placed This Encounter  Procedures  . DG Ankle Complete Left    Standing Status: Future     Number of Occurrences: 1     Standing Expiration Date: 02/27/2016    Order Specific Question:  Reason for Exam (SYMPTOM  OR DIAGNOSIS REQUIRED)    Answer:  lateral malleolus injury immed prior    Order Specific Question:  Preferred imaging location?    Answer:  External    Meds ordered this encounter  Medications  . oxyCODONE-acetaminophen (PERCOCET/ROXICET) 5-325 MG tablet    Sig: Take 1 tablet by mouth every 4 (four) hours as needed for severe pain.    Dispense:  20 tablet    Refill:  0    I personally  performed the services described in this documentation, which was scribed in my presence. The recorded information has been reviewed and considered, and addended by me as needed.  Delman Cheadle, MD MPH  FRACTURE CODE CHARGED W/ 90D COVERAGE.

## 2015-02-27 NOTE — Patient Instructions (Addendum)
RICE:  R: Rest is achieved by limiting weightbearing. Use crutches until you are able to walk with a normal gait. I:   Ice or cold water immersion for 15 to 20 minutes every two to three hours for the first 48 hours or until swelling is improved, whichever comes first. C: Compression with an elastic bandage or with the gel splint you were placed in can minimize swelling should be applied early. E: Elevation - The injured ankle should be kept elevated above tto he level of the heart to further alleviate swelling.  Nonsteroidal antiinflammatory drugs (NSAIDs) can be used; no particular NSAID has been shown to be superior so you can using whatever you have at home - ibuprofen, aleve, motrin, advil, etc.    Exercises including pointing and flexing the foot and doing foot circles should be started early, once acute pain and swelling subside, to maintain range of motion. The intensity of rehabilitation is increased gradually. Try to start doing the ankle alphabet. Ankle stirrup splint that can be worn in a shoe will help protect your ankle during the healing process.  The gel stirrup splint will limit extremes of joint motion and allow early weightbearing while protecting against reinjury.    As long as your are doing well - then following up with Dr. Tamala Julian on January 6th should be fine.  In 1 month we will plan to repeat the xray.  Fibular Ankle Fracture Treated With or Without Immobilization, Adult A fibular fracture at your ankle is a break (fracture) bone in the smallest of the two bones in your lower leg, located on the outside of your leg (fibula) close to the area at your ankle joint. CAUSES  Rolling your ankle.  Twisting your ankle.  Extreme flexing or extending of your foot.  Severe force on your ankle as when falling from a distance. RISK FACTORS  Jumping activities.  Participation in sports.  Osteoporosis.  Advanced age.  Previous ankle injuries. SIGNS AND  SYMPTOMS  Pain.  Swelling.  Inability to put weight on injured ankle.  Bruising.  Bone deformities at site of injury. DIAGNOSIS  This fracture is diagnosed with the help of an X-ray exam. TREATMENT  If the fractured bone did not move out of place it usually will heal without problems and does casting or splinting. If immobilization is needed for comfort or the fractured bone moved out of place and will not heal properly with immobilization, a cast or splint will be used. HOME CARE INSTRUCTIONS   Apply ice to the area of injury:  Put ice in a plastic bag.  Place a towel between your skin and the bag.  Leave the ice on for 20 minutes, 2-3 times a day.  Use crutches as directed. Resume walking without crutches as directed by your health care provider.  Only take over-the-counter or prescription medicines for pain, discomfort, or fever as directed by your health care provider.  If you have a removable splint or boot, do not remove the boot unless directed by your health care provider. SEEK MEDICAL CARE IF:   You have continued pain or more swelling  The medications do not control the pain. SEEK IMMEDIATE MEDICAL CARE IF:  You develop severe pain in the leg or foot.  Your skin or nails below the injury turn blue or grey or feel cold or numb. MAKE SURE YOU:   Understand these instructions.  Will watch your condition.  Will get help right away if you are not doing  well or get worse.   This information is not intended to replace advice given to you by your health care provider. Make sure you discuss any questions you have with your health care provider.   Document Released: 02/21/2005 Document Revised: 03/14/2014 Document Reviewed: 10/03/2012 Elsevier Interactive Patient Education [employer].

## 2015-03-04 ENCOUNTER — Encounter: Payer: Self-pay | Admitting: Family Medicine

## 2015-03-05 DIAGNOSIS — I10 Essential (primary) hypertension: Secondary | ICD-10-CM | POA: Insufficient documentation

## 2015-03-05 DIAGNOSIS — I2510 Atherosclerotic heart disease of native coronary artery without angina pectoris: Secondary | ICD-10-CM | POA: Insufficient documentation

## 2015-03-08 ENCOUNTER — Ambulatory Visit (INDEPENDENT_AMBULATORY_CARE_PROVIDER_SITE_OTHER): Payer: Medicare Other | Admitting: Family Medicine

## 2015-03-08 ENCOUNTER — Ambulatory Visit (INDEPENDENT_AMBULATORY_CARE_PROVIDER_SITE_OTHER): Payer: Medicare Other

## 2015-03-08 VITALS — HR 63 | Temp 98.3°F | Resp 20

## 2015-03-08 DIAGNOSIS — S82402D Unspecified fracture of shaft of left fibula, subsequent encounter for closed fracture with routine healing: Secondary | ICD-10-CM | POA: Diagnosis not present

## 2015-03-08 DIAGNOSIS — S82409A Unspecified fracture of shaft of unspecified fibula, initial encounter for closed fracture: Secondary | ICD-10-CM

## 2015-03-08 HISTORY — DX: Unspecified fracture of shaft of unspecified fibula, initial encounter for closed fracture: S82.409A

## 2015-03-08 NOTE — Patient Instructions (Signed)
Fibular Fracture With Rehab °The fibula is the smaller of the two lower leg bones and is vulnerable to breaks (fracture). Fibular fractures may go fully through the bone (complete) or partially (incomplete). The bone fragments are rarely out of alignment (displaced fracture). Fibula fractures may occur anywhere along the bone. However, this document only discusses fractures that do not involve a leg joint. Fibular fractures are not often a severe injury because the bone supports only about 17% of the body weight. °SYMPTOMS  °· Moderate to severe pain in the lower leg. °· Tenderness and swelling in the leg or calf. °· Bleeding and/or bruising (contusion) in the leg. °· Inability to bear weight on the injured extremity. °· Visible deformity, if the fracture is displaced. °· Numbness and coldness in the leg and foot, beyond the fracture site, if blood supply is impaired. °CAUSES  °Fractures occur when a force is placed on the bone that is greater than it can withstand. Common causes of fibular fracture include: °· Direct hit (trauma) (i.e., hockey or lacrosse check to the lower leg). °· Stress fracture (weakening of the bone from repeated stress). °· Indirect injury, caused by twisting, turning quickly, or violent muscle contraction. °RISK INCREASES WITH: °· Contact sports (i.e., football, soccer, lacrosse, hockey). °· Sports that can cause twisted ankle injury (i.e., skiing, basketball). °· Bony abnormalities (i.e., osteoporosis or bone tumors). °· Metabolism disorders, hormone problems, and nutrition deficiency and disorders (i.e., anorexia and bulimia). °· Poor strength and flexibility. °PREVENTION  °· Warm up and stretch properly before activity. °· Maintain physical fitness: °¨ Strength, flexibility, and endurance. °¨ Cardiovascular fitness. °· Wear properly fitted and padded protective equipment (i.e., shin guards for soccer). °PROGNOSIS  °If treated properly, fibular fractures usually heal in 4 to 6 weeks.    °RELATED COMPLICATIONS  °· Failure of bone to heal (nonunion). °· Bone heals in a poor position (malunion). °· Increased pressure inside the leg (compartment syndrome) due to injury that disrupts the blood supply to the leg and foot and injures the nerves and muscles (uncommon). °· Shortening of the injured bones. °· Hindrance of normal bone growth in children. °· Risks of surgery: infection, bleeding, injury to nerves (numbness, weakness, paralysis), need for further surgery. °· Longer healing time if activity is resumed too soon. °TREATMENT °Treatment first involves ice, medicine, and elevation of the leg to reduce pain and inflammation. People with fibular fractures are advised to walk using crutches. A brace or walking boot may be given to restrain the injured leg and allow for healing. Sometimes, surgery is needed to place a rod, plate, or screws in the bones in order to fix the fracture. After surgery, the leg is restrained. After restraint (with or without surgery), it is important to complete strengthening and stretching exercises to regain strength and a full range of motion. Exercises may be completed at home or with a therapist. °MEDICATION  °· If pain medicine is needed, nonsteroidal anti-inflammatory medicines (aspirin and ibuprofen), or other minor pain relievers (acetaminophen), are often advised. °· Do not take pain medicine for 7 days before surgery. °· Prescription pain relievers may be given if your health care provider thinks they are needed. Use only as directed and only as much as you need. °SEEK MEDICAL CARE IF: °· Symptoms get worse or do not improve in 2 weeks, despite treatment. °· The following occur after restraint or surgery. (Report any of these signs immediately): °¨ Swelling above or below the fracture site. °¨ Severe, persistent pain. °¨   Blue or gray skin below the fracture site, especially under the toenails. Numbness or loss of feeling below the fracture site. °¨ New, unexplained  symptoms develop. (Drugs used in treatment may produce side effects.) °EXERCISES  °RANGE OF MOTION (ROM) AND STRETCHING EXERCISES - Fibular Fracture °These exercises may help you when beginning to recover from your injury. Your symptoms may go away with or without further involvement from your physician, physical therapist or athletic trainer. While completing these exercises, remember:  °· Restoring tissue flexibility helps normal motion to return to the joints. This allows healthier, less painful movement and activity. °· An effective stretch should be held for at least 30 seconds. °· A stretch should never be painful. You should only feel a gentle lengthening or release in the stretched tissue. °RANGE OF MOTION - Dorsi/Plantar Flexion °· While sitting with your right / left knee straight, draw the top of your foot upwards by flexing your ankle. Then reverse the motion, pointing your toes downward. °· Hold each position for __________ seconds. °· After completing your first set of exercises, repeat this exercise with your knee bent. °Repeat __________ times. Complete this exercise __________ times per day.  °STRETCH - Gastrocsoleus  °· Sit with your right / left leg extended. Holding onto both ends of a belt or towel, loop it around the ball of your foot. °· Keeping your right / left ankle and foot relaxed and your knee straight, pull your foot and ankle toward you using the belt. °· You should feel a gentle stretch behind your calf or knee. Hold this position for __________ seconds. °Repeat __________ times. Complete this stretch __________ times per day.  °RANGE OF MOTION- Ankle Plantar Flexion  °· Sit with your right / left leg crossed over your opposite knee. °· Use your opposite hand to pull the top of your foot and toes toward you. °· You should feel a gentle stretch on the top of your foot and ankle. Hold this position for __________ seconds. °Repeat __________ times. Complete __________ times per day.    °RANGE OF MOTION - Ankle Eversion °· Sit with your right / left ankle crossed over your opposite knee. °· Grip your foot with your opposite hand, placing your thumb on the top of your foot and your fingers across the bottom of your foot. °· Gently push your foot downward with a slight rotation so your littlest toes rise slightly toward the ceiling. °· You should feel a gentle stretch on the inside of your ankle. Hold the stretch for __________ seconds. °Repeat __________ times. Complete this exercise __________ times per day.  °RANGE OF MOTION - Ankle Inversion °· Sit with your right / left ankle crossed over your opposite knee. °· Grip your foot with your opposite hand, placing your thumb on the bottom of your foot and your fingers across the top of your foot. °· Gently pull your foot so the smallest toe comes toward you and your thumb pushes the inside of the ball of your foot away from you. °· You should feel a gentle stretch on the outside of your ankle. Hold the stretch for __________ seconds. °Repeat __________ times. Complete this exercise __________ times per day.  °RANGE OF MOTION - Ankle Alphabet °· Imagine your right / left big toe is a pen. °· Keeping your hip and knee still, write out the entire alphabet with your "pen." Make the letters as large as you can, without increasing any discomfort. °Repeat __________ times. Complete this exercise   __________ times per day.  °RANGE OF MOTION - Ankle Dorsiflexion, Active Assisted °· Remove your shoes and sit on a chair, preferably not on a carpeted surface. °· Place your right / left foot on the floor, directly under your knee. Extend your opposite leg for support. °· Keeping your heel down, slide your right / left foot back toward the chair, until you feel a stretch at your ankle or calf. If you do not feel a stretch, slide your bottom forward to the edge of the chair, while still keeping your heel down. °· Hold this stretch for __________ seconds. °Repeat  __________ times. Complete this stretch __________ times per day.  °STRENGTHENING EXERCISES - Fibular Fracture °These exercises may help you when beginning to recover from your injury. They may resolve your symptoms with or without further involvement from your physician, physical therapist or athletic trainer. While completing these exercises, remember:  °· Muscles can gain both the endurance and the strength needed for everyday activities through controlled exercises. °· Complete these exercises as instructed by your physician, physical therapist or athletic trainer. Increase the resistance and repetitions only as guided. °· You may experience muscle soreness or fatigue, but the pain or discomfort you are trying to eliminate should never worsen during these exercises. If this pain does get worse, stop and make certain you are following the directions exactly. If the pain is still present after adjustments, discontinue the exercise until you can discuss the trouble with your clinician. °STRENGTH - Dorsiflexors °· Secure a rubber exercise band or tubing to a fixed object (table, pole) and loop the other end around your right / left foot. °· Sit on the floor, facing the fixed object. The band should be slightly tense when your foot is relaxed. °· Slowly draw your foot back toward you, using your ankle and toes. °· Hold this position for __________ seconds. Slowly release the tension in the band and return your foot to the starting position. °Repeat __________ times. Complete this exercise __________ times per day.  °STRENGTH - Plantar-flexors °· Sit with your right / left leg extended. Holding onto both ends of a rubber exercise band or tubing, loop it around the ball of your foot. Keep a slight tension in the band. °· Slowly push your toes away from you, pointing them downward. °· Hold this position for __________ seconds. Return to the starting position slowly, controlling the tension in the band. °Repeat  __________ times. Complete this exercise __________ times per day.  °STRENGTH - Plantar-flexors, Standing  °· Stand with your feet shoulder width apart. Place your hands on a wall or table to steady yourself, using as little support as needed. °· Keeping your weight evenly spread over the width of your feet, rise up on your toes.* °· Hold this position for __________ seconds. °Repeat __________ times. Complete this exercise __________ times per day.  °*If this is too easy, shift your weight toward your right / left leg until you feel challenged. Ultimately, you may be asked to do this exercise while standing on your right / left foot only. °STRENGTH - Towel Curls °· Sit in a chair, on a non-carpeted surface. °· Place your foot on a towel, keeping your heel on the floor. °· Pull the towel toward your heel only by curling your toes. Keep your heel on the floor. °· If instructed by your physician, physical therapist or athletic trainer, add ____________________ at the end of the towel. °Repeat __________ times. Complete this exercise __________   times per day. °STRENGTH - Ankle Eversion °· Secure one end of a rubber exercise band or tubing to a fixed object (table, pole). Loop the other end around your foot, just before your toes. °· Place your fists between your knees. This will focus your strengthening at your ankle. °· Drawing the band across your opposite foot, away from the pole, slowly pull your little toe out and up. Make sure the band is positioned to resist the entire motion. °· Hold this position for __________ seconds. °· Return to the starting position slowly, controlling the tension in the band. °Repeat __________ times. Complete this exercise __________ times per day.  °STRENGTH - Ankle Inversion °· Secure one end of a rubber exercise band or tubing to a fixed object (table, pole). Loop the other end around your foot, just before your toes. °· Place your fists between your knees. This will focus your  strengthening at your ankle. °· Slowly, pull your big toe up and in, making sure the band is positioned to resist the entire motion. °· Hold this position for __________ seconds. °· Return to the starting position slowly, controlling the tension in the band. °Repeat __________ times. Complete this exercises __________ times per day.  °  °This information is not intended to replace advice given to you by your health care provider. Make sure you discuss any questions you have with your health care provider. °  °Document Released: 02/21/2005 Document Revised: 03/14/2014 Document Reviewed: 06/05/2008 °Elsevier Interactive Patient Education ©2016 [employer] ° °

## 2015-03-08 NOTE — Progress Notes (Signed)
Subjective:    Patient ID: Kaylee Weiss, female    DOB: [birth year], 70 y.o.   MRN: [id number]  03/08/2015  Follow-up   HPI This 70 y.o. female presents for one week follow-up/evaluation of L distal fibular fracture.  No pain since injury.  Taking Percocet for the first two nights with excellent pain control.  Wearing air cast which causes some discomfort.  Swelling has really improved.  Lots of bruising.  No n/t/burning.  L ankle injury is causing worsening back pain.    Bladder medication is so helpful; doing prunes to off set constipation side effect.    Review of Systems  Constitutional: Negative for fever, chills, diaphoresis and fatigue.  Musculoskeletal: Positive for joint swelling. Negative for arthralgias and gait problem.    Past Medical History  Diagnosis Date  . Hypertension   . Fibromyalgia   . Arthritis    Past Surgical History  Procedure Laterality Date  . Hand surgery Left   . Abdominal surgery    . Rotator cuff repair Right    Allergies  Allergen Reactions  . Demerol [Meperidine] Anaphylaxis  . Percocet [Oxycodone-Acetaminophen] Nausea And Vomiting   Current Outpatient Prescriptions  Medication Sig Dispense Refill  . aspirin 81 MG tablet Take 81 mg by mouth daily.    Marland Kitchen atorvastatin (LIPITOR) 10 MG tablet Take 1 tablet (10 mg total) by mouth daily. 90 tablet 3  . buPROPion (WELLBUTRIN XL) 150 MG 24 hr tablet Take 1 tablet (150 mg total) by mouth daily. 90 tablet 3  . celecoxib (CELEBREX) 200 MG capsule Take 1 capsule (200 mg total) by mouth 2 (two) times daily. 180 capsule 3  . Eflornithine HCl 13.9 % cream Apply 1 application topically 2 (two) times daily with a meal. 45 g 3  . folic acid (FOLVITE) 1 MG tablet Take 1 mg by mouth daily.    Marland Kitchen losartan (COZAAR) 50 MG tablet TAKE 1 TABLET BY MOUTH ONCE DAILY   "OFFICE VISIT NEEDED FOR REFILLS" 90 tablet 0  . Melatonin 5 MG CAPS Take by mouth.    . metoprolol (TOPROL-XL) 200 MG 24 hr tablet Take 1 tablet (200  mg total) by mouth daily. 90 tablet 3  . OVER THE COUNTER MEDICATION OTC B6 100 mgm taking daily    . OVER THE COUNTER MEDICATION OTC preservision areds taking 1 in the am/qhs    . OVER THE COUNTER MEDICATION OTC B12-SL taking everyday    . OVER THE COUNTER MEDICATION OTC Tylenol pm taking 2 tablets at bedtime    . oxyCODONE-acetaminophen (PERCOCET/ROXICET) 5-325 MG tablet Take 1 tablet by mouth every 4 (four) hours as needed for severe pain. 20 tablet 0  . tolterodine (DETROL LA) 2 MG 24 hr capsule Take 1 capsule (2 mg total) by mouth daily. 90 capsule 3  . valACYclovir (VALTREX) 500 MG tablet Take 1 tablet (500 mg total) by mouth 2 (two) times daily. 180 tablet 3   No current facility-administered medications for this visit.   Social History   Social History  . Marital Status: Married    Spouse Name: N/A  . Number of Children: N/A  . Years of Education: N/A   Occupational History  . retired     Firefighter   Social History Main Topics  . Smoking status: Never Smoker   . Smokeless tobacco: Not on file  . Alcohol Use: 0.0 oz/week    0 Standard drinks or equivalent per week  . Drug Use: No  .  Sexual Activity: No   Other Topics Concern  . Not on file   Social History Narrative   Family History  Problem Relation Age of Onset  . Heart attack Mother   . Stroke Mother   . Diabetes Mother   . Heart disease Mother   . Hyperlipidemia Mother   . Hypertension Mother   . Diabetes Father   . Stroke Father   . Heart attack Father   . Cancer Father   . Heart disease Father   . Hyperlipidemia Father   . Hypertension Father   . Asthma Son        Objective:    Pulse 63  Temp(Src) 98.3 F (36.8 C) (Oral)  Resp 20  SpO2 96% Physical Exam  Constitutional: She appears well-developed and well-nourished. No distress.  Cardiovascular: Intact distal pulses.   DP pulse 2+; capillary refill < 3 seconds.  Musculoskeletal:       Left knee: Normal.       Left ankle: She  exhibits ecchymosis. She exhibits normal range of motion, no swelling, no deformity, no laceration and normal pulse. No tenderness. No lateral malleolus, no medial malleolus, no posterior TFL, no head of 5th metatarsal and no proximal fibula tenderness found. Achilles tendon normal.       Left lower leg: Normal. She exhibits no tenderness, no bony tenderness, no swelling, no edema, no deformity and no laceration.       Left foot: Normal. There is normal range of motion, no tenderness, no bony tenderness, no swelling, normal capillary refill, no crepitus, no deformity and no laceration.  Skin: She is not diaphoretic.  Nursing note and vitals reviewed.   [facility] reading (PRIMARY) by  Dr. Tamala Julian.  L ANKLE FILMS: stable distal fibular fracture avulsion.      Assessment & Plan:   1. Left fibular fracture, closed, with routine healing, subsequent encounter    -stable and pain free at this time. -transition from air cast to [facility]. -RTC 4-5 weeks for repeat imaging.   Orders Placed This Encounter  Procedures  . DG Ankle Complete Left    Standing Status: Future     Number of Occurrences: 1     Standing Expiration Date: 03/07/2016    Order Specific Question:  Reason for Exam (SYMPTOM  OR DIAGNOSIS REQUIRED)    Answer:  L fibular fracture; one week follow-up    Order Specific Question:  Preferred imaging location?    Answer:  External  . Apply ASO ankle    Scheduling Instructions:     LEFT   No orders of the defined types were placed in this encounter.    Return in about 4 weeks (around 04/05/2015) for recheck.    Kristi Elayne Guerin, M.D. [facility] [address] [place], [place]  [id number] [phone] phone [phone] fax

## 2015-03-13 ENCOUNTER — Encounter: Payer: Self-pay | Admitting: Family Medicine

## 2015-03-13 ENCOUNTER — Ambulatory Visit (INDEPENDENT_AMBULATORY_CARE_PROVIDER_SITE_OTHER): Payer: Medicare Other | Admitting: Family Medicine

## 2015-03-13 ENCOUNTER — Ambulatory Visit (INDEPENDENT_AMBULATORY_CARE_PROVIDER_SITE_OTHER): Payer: Medicare Other

## 2015-03-13 VITALS — BP 138/78 | HR 87 | Temp 97.4°F | Resp 16

## 2015-03-13 DIAGNOSIS — R05 Cough: Secondary | ICD-10-CM | POA: Diagnosis not present

## 2015-03-13 DIAGNOSIS — G834 Cauda equina syndrome: Secondary | ICD-10-CM

## 2015-03-13 DIAGNOSIS — E785 Hyperlipidemia, unspecified: Secondary | ICD-10-CM

## 2015-03-13 DIAGNOSIS — Z1211 Encounter for screening for malignant neoplasm of colon: Secondary | ICD-10-CM | POA: Diagnosis not present

## 2015-03-13 DIAGNOSIS — M797 Fibromyalgia: Secondary | ICD-10-CM

## 2015-03-13 DIAGNOSIS — D369 Benign neoplasm, unspecified site: Secondary | ICD-10-CM | POA: Diagnosis not present

## 2015-03-13 DIAGNOSIS — I10 Essential (primary) hypertension: Secondary | ICD-10-CM

## 2015-03-13 DIAGNOSIS — N3941 Urge incontinence: Secondary | ICD-10-CM | POA: Diagnosis not present

## 2015-03-13 DIAGNOSIS — Z1231 Encounter for screening mammogram for malignant neoplasm of breast: Secondary | ICD-10-CM | POA: Diagnosis not present

## 2015-03-13 DIAGNOSIS — Z1159 Encounter for screening for other viral diseases: Secondary | ICD-10-CM | POA: Diagnosis not present

## 2015-03-13 DIAGNOSIS — L570 Actinic keratosis: Secondary | ICD-10-CM | POA: Diagnosis not present

## 2015-03-13 DIAGNOSIS — Z0000 Encounter for general adult medical examination without abnormal findings: Secondary | ICD-10-CM | POA: Diagnosis not present

## 2015-03-13 DIAGNOSIS — L280 Lichen simplex chronicus: Secondary | ICD-10-CM

## 2015-03-13 DIAGNOSIS — R059 Cough, unspecified: Secondary | ICD-10-CM

## 2015-03-13 DIAGNOSIS — M47817 Spondylosis without myelopathy or radiculopathy, lumbosacral region: Secondary | ICD-10-CM | POA: Diagnosis not present

## 2015-03-13 DIAGNOSIS — L821 Other seborrheic keratosis: Secondary | ICD-10-CM

## 2015-03-13 DIAGNOSIS — E2839 Other primary ovarian failure: Secondary | ICD-10-CM | POA: Diagnosis not present

## 2015-03-13 LAB — POCT URINALYSIS DIP (MANUAL ENTRY)
BILIRUBIN UA: NEGATIVE
GLUCOSE UA: NEGATIVE
Nitrite, UA: NEGATIVE
Protein Ur, POC: NEGATIVE
RBC UA: NEGATIVE
SPEC GRAV UA: 1.025
Urobilinogen, UA: 1
pH, UA: 5.5

## 2015-03-13 LAB — COMPREHENSIVE METABOLIC PANEL
ALBUMIN: 4 g/dL (ref 3.6–5.1)
ALK PHOS: 72 U/L (ref 33–130)
ALT: 46 U/L — AB (ref 6–29)
AST: 37 U/L — ABNORMAL HIGH (ref 10–35)
BUN: 23 mg/dL (ref 7–25)
CALCIUM: 9.1 mg/dL (ref 8.6–10.4)
CHLORIDE: 105 mmol/L (ref 98–110)
CO2: 28 mmol/L (ref 20–31)
Creat: 0.88 mg/dL (ref 0.60–0.93)
Glucose, Bld: 86 mg/dL (ref 65–99)
POTASSIUM: 4.2 mmol/L (ref 3.5–5.3)
Sodium: 140 mmol/L (ref 135–146)
TOTAL PROTEIN: 6.1 g/dL (ref 6.1–8.1)
Total Bilirubin: 0.6 mg/dL (ref 0.2–1.2)

## 2015-03-13 LAB — LIPID PANEL
CHOL/HDL RATIO: 2.9 ratio (ref <=5.0)
CHOLESTEROL: 147 mg/dL (ref 125–200)
HDL: 50 mg/dL (ref >=46)
LDL Cholesterol: 79 mg/dL (ref <130)
TRIGLYCERIDES: 92 mg/dL (ref <150)
VLDL: 18 mg/dL (ref <30)

## 2015-03-13 LAB — CBC WITH DIFFERENTIAL/PLATELET
BASOS ABS: 0 10*3/uL (ref 0.0–0.1)
Basophils Relative: 0 % (ref 0–1)
EOS ABS: 0.2 10*3/uL (ref 0.0–0.7)
Eosinophils Relative: 3 % (ref 0–5)
HEMATOCRIT: 42.9 % (ref 36.0–46.0)
Hemoglobin: 13.9 g/dL (ref 12.0–15.0)
LYMPHS ABS: 2.2 10*3/uL (ref 0.7–4.0)
LYMPHS PCT: 33 % (ref 12–46)
MCH: 31.4 pg (ref 26.0–34.0)
MCHC: 32.4 g/dL (ref 30.0–36.0)
MCV: 96.8 fL (ref 78.0–100.0)
MONOS PCT: 9 % (ref 3–12)
MPV: 9.4 fL (ref 8.6–12.4)
Monocytes Absolute: 0.6 10*3/uL (ref 0.1–1.0)
NEUTROS ABS: 3.7 10*3/uL (ref 1.7–7.7)
Neutrophils Relative %: 55 % (ref 43–77)
Platelets: 214 10*3/uL (ref 150–400)
RBC: 4.43 MIL/uL (ref 3.87–5.11)
RDW: 14.1 % (ref 11.5–15.5)
WBC: 6.7 10*3/uL (ref 4.0–10.5)

## 2015-03-13 MED ORDER — ZOLPIDEM TARTRATE 5 MG PO TABS: 5.0000 mg | ORAL_TABLET | Freq: Every evening | ORAL | Status: DC | PRN

## 2015-03-13 NOTE — Progress Notes (Signed)
Subjective:    Patient ID: Kaylee Weiss, female    DOB: 1945/12/14, 70 y.o.   MRN: [id number]  03/13/2015  Annual Exam   HPI This 70 y.o. female presents for Annual Wellness Examination.  Last physical: 2016 Pap smear:  unsre Mammogram:  10-07-2013; 2016 Colonoscopy:  2006; refusing repeat.  Agreeable to stool cards. Bone density:  In past; unsure of date.   TDAP:  2015 Pneumovax:  2012; 2015 Prevnar Zostavax:  2007 Influenza:  2016 Eye exam:  Every year; +glasses; macular degeneration R. Dental exam:  Every six months.  Pulmonary embolism, retinal vein occlusion: age 2, 16.  No triggers.  No OCPs.  No contraception.  Daughter with ITP.  HTN: Patient reports good compliance with medication, good tolerance to medication, and good symptom control.  Does not check BP at home.  Metoprolol and Losartan.  Hyperlipidemia: Patient reports good compliance with medication, good tolerance to medication, and good symptom control.  Family history of early CAD in females.  Lipitor.  Anxiety and depression/chronic pain syndrome:  Wellbutrin XL works well for chronic pain; emotionally stable with Wellbutrin.  DDD lumbar spine:  Celebrex daily; Percocet for severe pain.   Facial hair: applies Eflornithine cream 13.9%; helps with facial hair; extremely expensive.  Works well.    HSV labialis: must take Valtrex 500mg  bid for suppression.;  Insomnia: tried Melatonin; does not help; needs help. Prefers Ambien.  Review of Systems  Constitutional: Negative for fever, chills, diaphoresis, activity change, appetite change, fatigue and unexpected weight change.  HENT: Negative for congestion, dental problem, drooling, ear discharge, ear pain, facial swelling, hearing loss, mouth sores, nosebleeds, postnasal drip, rhinorrhea, sinus pressure, sneezing, sore throat, tinnitus, trouble swallowing and voice change.   Eyes: Negative for photophobia, pain, discharge, redness, itching and visual disturbance.    Respiratory: Negative for apnea, cough, choking, chest tightness, shortness of breath, wheezing and stridor.   Cardiovascular: Negative for chest pain, palpitations and leg swelling.  Gastrointestinal: Negative for nausea, vomiting, abdominal pain, diarrhea, constipation, blood in stool, abdominal distention, anal bleeding and rectal pain.  Endocrine: Negative for cold intolerance, heat intolerance, polydipsia, polyphagia and polyuria.  Genitourinary: Negative for dysuria, urgency, frequency, hematuria, flank pain, decreased urine volume, vaginal bleeding, vaginal discharge, enuresis, difficulty urinating, genital sores, vaginal pain, menstrual problem, pelvic pain and dyspareunia.       Nocturia; no leakage of urine now on Detrol.   Musculoskeletal: Positive for back pain. Negative for myalgias, joint swelling, arthralgias, gait problem, neck pain and neck stiffness.  Skin: Negative for color change, pallor, rash and wound.  Allergic/Immunologic: Negative for environmental allergies, food allergies and immunocompromised state.  Neurological: Negative for dizziness, tremors, seizures, syncope, facial asymmetry, speech difficulty, weakness, light-headedness, numbness and headaches.  Hematological: Negative for adenopathy. Does not bruise/bleed easily.  Psychiatric/Behavioral: Positive for sleep disturbance and dysphoric mood. Negative for suicidal ideas, hallucinations, behavioral problems, confusion, self-injury, decreased concentration and agitation. The patient is nervous/anxious. The patient is not hyperactive.     Past Medical History  Diagnosis Date  . Hypertension   . Fibromyalgia   . Arthritis   . Macular degeneration, right eye   . Pulmonary embolism ([place])     age 5; not on OCP.  Coumadin x 2 years.  . Retinal vein occlusion     age 2.  Marland Kitchen Hyperlipidemia   . Anxiety   . Herpes labialis    Past Surgical History  Procedure Laterality Date  . Hand surgery Left   .  Abdominal  surgery    . Rotator cuff repair Right    Allergies  Allergen Reactions  . Demerol [Meperidine] Anaphylaxis  . Percocet [Oxycodone-Acetaminophen] Nausea And Vomiting   Current Outpatient Prescriptions  Medication Sig Dispense Refill  . aspirin 81 MG tablet Take 81 mg by mouth daily.    Marland Kitchen atorvastatin (LIPITOR) 10 MG tablet Take 1 tablet (10 mg total) by mouth daily. 90 tablet 3  . buPROPion (WELLBUTRIN XL) 150 MG 24 hr tablet Take 1 tablet (150 mg total) by mouth daily. 90 tablet 3  . celecoxib (CELEBREX) 200 MG capsule Take 1 capsule (200 mg total) by mouth 2 (two) times daily. 180 capsule 3  . Eflornithine HCl 13.9 % cream Apply 1 application topically 2 (two) times daily with a meal. 45 g 3  . folic acid (FOLVITE) 1 MG tablet Take 1 mg by mouth daily.    Marland Kitchen losartan (COZAAR) 50 MG tablet TAKE 1 TABLET BY MOUTH ONCE DAILY   "OFFICE VISIT NEEDED FOR REFILLS" 90 tablet 0  . Melatonin 5 MG CAPS Take by mouth.    . metoprolol (TOPROL-XL) 200 MG 24 hr tablet Take 1 tablet (200 mg total) by mouth daily. 90 tablet 3  . OVER THE COUNTER MEDICATION OTC B6 100 mgm taking daily    . OVER THE COUNTER MEDICATION OTC preservision areds taking 1 in the am/qhs    . OVER THE COUNTER MEDICATION OTC B12-SL taking everyday    . OVER THE COUNTER MEDICATION OTC Tylenol pm taking 2 tablets at bedtime    . oxyCODONE-acetaminophen (PERCOCET/ROXICET) 5-325 MG tablet Take 1 tablet by mouth every 4 (four) hours as needed for severe pain. 20 tablet 0  . tolterodine (DETROL LA) 2 MG 24 hr capsule Take 1 capsule (2 mg total) by mouth daily. 90 capsule 3  . valACYclovir (VALTREX) 500 MG tablet Take 1 tablet (500 mg total) by mouth 2 (two) times daily. 180 tablet 3  . zolpidem (AMBIEN) 5 MG tablet Take 1 tablet (5 mg total) by mouth at bedtime as needed for sleep. 30 tablet 5   No current facility-administered medications for this visit.   Social History   Social History  . Marital Status: Married    Spouse Name:  N/A  . Number of Children: 2  . Years of Education: N/A   Occupational History  . 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 Smoking status: Never Smoker   . Smokeless tobacco: Not on file  . Alcohol Use: 0.0 oz/week    0 Standard drinks or equivalent per week  . Drug Use: No  . Sexual Activity: No   Other Topics Concern  . Not on file   Social History Narrative   Marital status:  Married x 45 years      Children: 2 children (48, 75); no granchildren      Lives: with husband, german shepard      Employed: works once per week; has a Patent attorney one day per week; previous therapy practice.        Tobacco: never      Alcohol:  Socially; one glass of wine per week      Exercise:  Daily exercise; 3.0 miles per day      Advanced Directives:  FULL CODE; no prolonged measures      ADLs: independent with ADLs.  Family History  Problem Relation Age of Onset  . Heart attack Mother   . Stroke Mother     CVA age 76; repeat at age 54 as cause of death.  . Diabetes Mother   . Heart disease Mother 28    recurrent AMIs  . Hyperlipidemia Mother   . Hypertension Mother   . Diabetes Father   . Heart attack Father   . Cancer Father 57    lung cancer  . Hyperlipidemia Father   . Hypertension Father   . Asthma Son        Objective:    BP 138/78 mmHg  Pulse 87  Temp(Src) 97.4 F (36.3 C) (Oral)  Resp 16  SpO2 92% Physical Exam  Constitutional: She is oriented to person, place, and time. She appears well-developed and well-nourished. No distress.  HENT:  Head: Normocephalic and atraumatic.  Right Ear: External ear normal.  Left Ear: External ear normal.  Nose: Nose normal.  Mouth/Throat: Oropharynx is clear and moist.  Eyes: Conjunctivae and EOM are normal. Pupils are equal, round, and reactive to light.  Neck: Normal range of motion and full passive range of motion without pain. Neck supple. No JVD present. Carotid  bruit is not present. No thyromegaly present.  Cardiovascular: Normal rate, regular rhythm and normal heart sounds.  Exam reveals no gallop and no friction rub.   No murmur heard. Pulmonary/Chest: Effort normal and breath sounds normal. She has no wheezes. She has no rales.  Abdominal: Soft. Bowel sounds are normal. She exhibits no distension and no mass. There is no tenderness. There is no rebound and no guarding.  Musculoskeletal:       Right shoulder: Normal.       Left shoulder: Normal.       Cervical back: Normal.  Lymphadenopathy:    She has no cervical adenopathy.  Neurological: She is alert and oriented to person, place, and time. She has normal reflexes. No cranial nerve deficit. She exhibits normal muscle tone. Coordination normal.  Skin: Skin is warm and dry. No rash noted. She is not diaphoretic. No erythema. No pallor.  Psychiatric: She has a normal mood and affect. Her behavior is normal. Judgment and thought content normal.  Nursing note and vitals reviewed.  Results for orders placed or performed in visit on 02/17/15  Urine culture  Result Value Ref Range   Colony Count NO GROWTH    Organism ID, Bacteria NO GROWTH   POCT urinalysis dipstick  Result Value Ref Range   Color, UA yellow yellow   Clarity, UA clear clear   Glucose, UA negative negative   Bilirubin, UA negative negative   Ketones, POC UA negative negative   Spec Grav, UA >=1.030    Blood, UA negative negative   pH, UA 5.0    Protein Ur, POC negative negative   Urobilinogen, UA 0.2    Nitrite, UA Negative Negative   Leukocytes, UA Negative Negative   [facility] reading (PRIMARY) by  Dr. Tamala Julian.  CXR: NAD      Assessment & Plan:   1. Encounter for Medicare annual wellness exam   2. Encounter for screening mammogram for breast cancer   3. Estrogen deficiency   4. Essential hypertension   5. Urge incontinence of urine   6. Lumbar and sacral osteoarthritis   7. Lichen simplex   8. Fibromyalgia   9.  Cauda equina syndrome ([place])   10. Basal cell papilloma   11. Actinic keratoses  Orders Placed This Encounter  Procedures  . MM Digital Screening    Standing Status: Future     Number of Occurrences:      Standing Expiration Date: 05/10/2016    Order Specific Question:  Reason for Exam (SYMPTOM  OR DIAGNOSIS REQUIRED)    Answer:  annual screening    Order Specific Question:  Preferred imaging location?    Answer:  External  . DG Bone Density    Standing Status: Future     Number of Occurrences:      Standing Expiration Date: 05/10/2016    Order Specific Question:  Reason for Exam (SYMPTOM  OR DIAGNOSIS REQUIRED)    Answer:  screening    Order Specific Question:  Preferred imaging location?    Answer:  [facility]   Meds ordered this encounter  Medications  . DISCONTD: zolpidem (AMBIEN) 5 MG tablet    Sig: Take 5 mg by mouth at bedtime as needed for sleep.  Marland Kitchen zolpidem (AMBIEN) 5 MG tablet    Sig: Take 1 tablet (5 mg total) by mouth at bedtime as needed for sleep.    Dispense:  30 tablet    Refill:  5    No Follow-up on file.    Cleotha Tsang Elayne Guerin, M.D. [facility] [address] [place], [place]  [address] [phone] phone [phone] fax

## 2015-03-13 NOTE — Patient Instructions (Signed)

## 2015-03-14 LAB — HEPATITIS C ANTIBODY: HCV Ab: NEGATIVE

## 2015-03-18 ENCOUNTER — Telehealth: Payer: Self-pay

## 2015-03-18 DIAGNOSIS — G4700 Insomnia, unspecified: Secondary | ICD-10-CM | POA: Insufficient documentation

## 2015-03-18 NOTE — Telephone Encounter (Signed)
Patient called back and I gave her [place]'s message. She will call her insurance to verify demographics.

## 2015-03-18 NOTE — Telephone Encounter (Signed)
Exp scripts sent letter stating that they gave pt a temp supply of zolpidem, but it needs a PA for more. Tried to complete on covermymeds and by ph, but was told that pt's phone/address don't match pt in their system. LM w/husb for pt to call back. We need to verify Rx ins management and pt's demographics.

## 2015-04-03 ENCOUNTER — Ambulatory Visit (INDEPENDENT_AMBULATORY_CARE_PROVIDER_SITE_OTHER): Payer: Medicare Other

## 2015-04-03 ENCOUNTER — Encounter: Payer: Self-pay | Admitting: Family Medicine

## 2015-04-03 ENCOUNTER — Ambulatory Visit (INDEPENDENT_AMBULATORY_CARE_PROVIDER_SITE_OTHER): Payer: Medicare Other | Admitting: Family Medicine

## 2015-04-03 VITALS — BP 100/66 | HR 60 | Temp 97.6°F | Resp 16 | Ht 65.5 in

## 2015-04-03 DIAGNOSIS — S82402D Unspecified fracture of shaft of left fibula, subsequent encounter for closed fracture with routine healing: Secondary | ICD-10-CM

## 2015-04-03 DIAGNOSIS — S99912A Unspecified injury of left ankle, initial encounter: Secondary | ICD-10-CM | POA: Diagnosis not present

## 2015-04-03 DIAGNOSIS — L600 Ingrowing nail: Secondary | ICD-10-CM

## 2015-04-03 DIAGNOSIS — M7989 Other specified soft tissue disorders: Secondary | ICD-10-CM | POA: Diagnosis not present

## 2015-04-03 DIAGNOSIS — M5136 Other intervertebral disc degeneration, lumbar region: Secondary | ICD-10-CM

## 2015-04-03 DIAGNOSIS — N3941 Urge incontinence: Secondary | ICD-10-CM

## 2015-04-03 NOTE — Patient Instructions (Signed)
Because you received an x-ray today, you will receive an invoice from [facility]. Please contact [facility] at [phone] with questions or concerns regarding your invoice. Our billing staff will not be able to assist you with those questions. °

## 2015-04-07 NOTE — Progress Notes (Signed)
Subjective:    Patient ID: Kaylee Weiss, female    DOB: 12/27/1945, 70 y.o.   MRN: [id number]  04/03/2015  Follow-up   HPI   L fibular fracture: no pain; still swelling; able to walk 4 miles daily; injury four week sago.  L ingrown toenail:  Requesting referral to podiatry. Chronic ongoing issue.  DDD lumbar spine: continues to suffer with persistent horrible pain; coping well.  No worsening pain.  Urinary incontinence: much improved with medication daily.   Review of Systems  Constitutional: Negative for fever, chills, diaphoresis and fatigue.  Eyes: Negative for visual disturbance.  Respiratory: Negative for cough and shortness of breath.   Cardiovascular: Negative for chest pain, palpitations and leg swelling.  Gastrointestinal: Negative for nausea, vomiting, abdominal pain, diarrhea and constipation.  Endocrine: Negative for cold intolerance, heat intolerance, polydipsia, polyphagia and polyuria.  Neurological: Negative for dizziness, tremors, seizures, syncope, facial asymmetry, speech difficulty, weakness, light-headedness, numbness and headaches.    Past Medical History  Diagnosis Date  . Hypertension   . Fibromyalgia   . Arthritis   . Macular degeneration, right eye   . Pulmonary embolism ([place])     age 48; not on OCP.  Coumadin x 2 years.  . Retinal vein occlusion     age 45.  Marland Kitchen Hyperlipidemia   . Anxiety   . Herpes labialis    Past Surgical History  Procedure Laterality Date  . Hand surgery Left   . Abdominal surgery    . Rotator cuff repair Right    Allergies  Allergen Reactions  . Demerol [Meperidine] Anaphylaxis  . Percocet [Oxycodone-Acetaminophen] Nausea And Vomiting   Current Outpatient Prescriptions  Medication Sig Dispense Refill  . aspirin 81 MG tablet Take 81 mg by mouth daily.    Marland Kitchen atorvastatin (LIPITOR) 10 MG tablet Take 1 tablet (10 mg total) by mouth daily. 90 tablet 3  . buPROPion (WELLBUTRIN XL) 150 MG 24 hr tablet Take 1 tablet  (150 mg total) by mouth daily. 90 tablet 3  . celecoxib (CELEBREX) 200 MG capsule Take 1 capsule (200 mg total) by mouth 2 (two) times daily. 180 capsule 3  . Eflornithine HCl 13.9 % cream Apply 1 application topically 2 (two) times daily with a meal. 45 g 3  . folic acid (FOLVITE) 1 MG tablet Take 1 mg by mouth daily.    Marland Kitchen losartan (COZAAR) 50 MG tablet TAKE 1 TABLET BY MOUTH ONCE DAILY   "OFFICE VISIT NEEDED FOR REFILLS" 90 tablet 0  . Melatonin 5 MG CAPS Take by mouth.    . metoprolol (TOPROL-XL) 200 MG 24 hr tablet Take 1 tablet (200 mg total) by mouth daily. 90 tablet 3  . OVER THE COUNTER MEDICATION OTC B6 100 mgm taking daily    . OVER THE COUNTER MEDICATION OTC preservision areds taking 1 in the am/qhs    . OVER THE COUNTER MEDICATION OTC B12-SL taking everyday    . OVER THE COUNTER MEDICATION OTC Tylenol pm taking 2 tablets at bedtime    . oxyCODONE-acetaminophen (PERCOCET/ROXICET) 5-325 MG tablet Take 1 tablet by mouth every 4 (four) hours as needed for severe pain. 20 tablet 0  . tolterodine (DETROL LA) 2 MG 24 hr capsule Take 1 capsule (2 mg total) by mouth daily. 90 capsule 3  . valACYclovir (VALTREX) 500 MG tablet Take 1 tablet (500 mg total) by mouth 2 (two) times daily. 180 tablet 3  . zolpidem (AMBIEN) 5 MG tablet Take 1 tablet (5 mg  total) by mouth at bedtime as needed for sleep. 30 tablet 5   No current facility-administered medications for this visit.   Social History   Social History  . Marital Status: Married    Spouse Name: N/A  . Number of Children: 2  . Years of Education: N/A   Occupational History  . 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 Smoking status: Never Smoker   . Smokeless tobacco: Not on file  . Alcohol Use: 0.0 oz/week    0 Standard drinks or equivalent per week  . Drug Use: No  . Sexual Activity: No   Other Topics Concern  . Not on file   Social History Narrative   Marital status:  Married x  45 years      Children: 2 children (48, 48); no granchildren      Lives: with husband, german shepard      Employed: works once per week; has a Patent attorney one day per week; previous therapy practice.        Tobacco: never      Alcohol:  Socially; one glass of wine per week      Exercise:  Daily exercise; 3.0 miles per day      Advanced Directives:  FULL CODE; no prolonged measures      ADLs: independent with ADLs.           Family History  Problem Relation Age of Onset  . Heart attack Mother   . Stroke Mother     CVA age 29; repeat at age 35 as cause of death.  . Diabetes Mother   . Heart disease Mother 32    recurrent AMIs  . Hyperlipidemia Mother   . Hypertension Mother   . Diabetes Father   . Heart attack Father   . Cancer Father 71    lung cancer  . Hyperlipidemia Father   . Hypertension Father   . Asthma Son        Objective:    BP 100/66 mmHg  Pulse 60  Temp(Src) 97.6 F (36.4 C) (Oral)  Resp 16  Ht 5' 5.5" (1.664 m)  SpO2 99% Physical Exam  Constitutional: She is oriented to person, place, and time. She appears well-developed and well-nourished. No distress.  HENT:  Head: Normocephalic and atraumatic.  Right Ear: External ear normal.  Left Ear: External ear normal.  Nose: Nose normal.  Mouth/Throat: Oropharynx is clear and moist.  Eyes: Conjunctivae and EOM are normal. Pupils are equal, round, and reactive to light.  Neck: Normal range of motion. Neck supple. Carotid bruit is not present. No thyromegaly present.  Cardiovascular: Normal rate, regular rhythm, normal heart sounds and intact distal pulses.  Exam reveals no gallop and no friction rub.   No murmur heard. Pulmonary/Chest: Effort normal and breath sounds normal. She has no wheezes. She has no rales.  Abdominal: Soft. Bowel sounds are normal. She exhibits no distension and no mass. There is no tenderness. There is no rebound and no guarding.  Lymphadenopathy:    She has no cervical  adenopathy.  Neurological: She is alert and oriented to person, place, and time. No cranial nerve deficit.  Skin: Skin is warm and dry. No rash noted. She is not diaphoretic. No erythema. No pallor.  Psychiatric: She has a normal mood and affect. Her behavior is normal.   Results for orders placed or performed in visit on 03/13/15  CBC with Differential/Platelet  Result Value Ref Range   WBC 6.7 4.0 - 10.5 K/uL   RBC 4.43 3.87 - 5.11 MIL/uL   Hemoglobin 13.9 12.0 - 15.0 g/dL   HCT 42.9 36.0 - 46.0 %   MCV 96.8 78.0 - 100.0 fL   MCH 31.4 26.0 - 34.0 pg   MCHC 32.4 30.0 - 36.0 g/dL   RDW 14.1 11.5 - 15.5 %   Platelets 214 150 - 400 K/uL   MPV 9.4 8.6 - 12.4 fL   Neutrophils Relative % 55 43 - 77 %   Neutro Abs 3.7 1.7 - 7.7 K/uL   Lymphocytes Relative 33 12 - 46 %   Lymphs Abs 2.2 0.7 - 4.0 K/uL   Monocytes Relative 9 3 - 12 %   Monocytes Absolute 0.6 0.1 - 1.0 K/uL   Eosinophils Relative 3 0 - 5 %   Eosinophils Absolute 0.2 0.0 - 0.7 K/uL   Basophils Relative 0 0 - 1 %   Basophils Absolute 0.0 0.0 - 0.1 K/uL   Smear Review Criteria for review not met   Comprehensive metabolic panel  Result Value Ref Range   Sodium 140 135 - 146 mmol/L   Potassium 4.2 3.5 - 5.3 mmol/L   Chloride 105 98 - 110 mmol/L   CO2 28 20 - 31 mmol/L   Glucose, Bld 86 65 - 99 mg/dL   BUN 23 7 - 25 mg/dL   Creat 0.88 0.60 - 0.93 mg/dL   Total Bilirubin 0.6 0.2 - 1.2 mg/dL   Alkaline Phosphatase 72 33 - 130 U/L   AST 37 (H) 10 - 35 U/L   ALT 46 (H) 6 - 29 U/L   Total Protein 6.1 6.1 - 8.1 g/dL   Albumin 4.0 3.6 - 5.1 g/dL   Calcium 9.1 8.6 - 10.4 mg/dL  Lipid panel  Result Value Ref Range   Cholesterol 147 125 - 200 mg/dL   Triglycerides 92 <150 mg/dL   HDL 50 >=46 mg/dL   Total CHOL/HDL Ratio 2.9 <=5.0 Ratio   VLDL 18 <30 mg/dL   LDL Cholesterol 79 <130 mg/dL  Hepatitis C antibody  Result Value Ref Range   HCV Ab NEGATIVE NEGATIVE  POCT urinalysis dipstick  Result Value Ref Range   Color,  UA yellow yellow   Clarity, UA clear clear   Glucose, UA negative negative   Bilirubin, UA negative negative   Ketones, POC UA trace (5) (A) negative   Spec Grav, UA 1.025    Blood, UA negative negative   pH, UA 5.5    Protein Ur, POC negative negative   Urobilinogen, UA 1.0    Nitrite, UA Negative Negative   Leukocytes, UA small (1+) (A) Negative       Assessment & Plan:   1. Left fibular fracture, closed, with routine healing, subsequent encounter   2. Ingrown right big toenail   3. Degenerative disc disease, lumbar     Orders Placed This Encounter  Procedures  . DG Ankle Complete Left    Standing Status: Future     Number of Occurrences: 1     Standing Expiration Date: 04/02/2016    Order Specific Question:  Reason for Exam (SYMPTOM  OR DIAGNOSIS REQUIRED)    Answer:  L fibular fracture    Order Specific Question:  Preferred imaging location?    Answer:  External  . Ambulatory referral to Podiatry    Referral Priority:  Routine    Referral Type:  Consultation    Referral Reason:  Specialty Services Required    Requested Specialty:  Podiatry    Number of Visits Requested:  1   No orders of the defined types were placed in this encounter.    Return for recheck.    Taite Schoeppner Elayne Guerin, M.D. [facility] [address] [place], [place]  [phone] phone [phone] fax

## 2015-04-10 ENCOUNTER — Ambulatory Visit
Admission: RE | Admit: 2015-04-10 | Discharge: 2015-04-10 | Disposition: A | Discharge status: Home / Self Care | Payer: Medicare Other | Source: Ambulatory Visit | Attending: Family Medicine | Admitting: Family Medicine

## 2015-04-10 DIAGNOSIS — M8589 Other specified disorders of bone density and structure, multiple sites: Secondary | ICD-10-CM | POA: Diagnosis not present

## 2015-04-10 DIAGNOSIS — E2839 Other primary ovarian failure: Secondary | ICD-10-CM

## 2015-04-10 LAB — IFOBT (OCCULT BLOOD): IFOBT: NEGATIVE

## 2015-04-17 ENCOUNTER — Ambulatory Visit (INDEPENDENT_AMBULATORY_CARE_PROVIDER_SITE_OTHER): Payer: Medicare Other

## 2015-04-17 ENCOUNTER — Ambulatory Visit (INDEPENDENT_AMBULATORY_CARE_PROVIDER_SITE_OTHER): Payer: Medicare Other | Admitting: Family Medicine

## 2015-04-17 VITALS — BP 134/80 | HR 55 | Temp 97.6°F | Resp 18 | Ht 65.0 in | Wt 147.0 lb

## 2015-04-17 DIAGNOSIS — M47817 Spondylosis without myelopathy or radiculopathy, lumbosacral region: Secondary | ICD-10-CM

## 2015-04-17 DIAGNOSIS — M898X5 Other specified disorders of bone, thigh: Secondary | ICD-10-CM | POA: Diagnosis not present

## 2015-04-17 DIAGNOSIS — M8580 Other specified disorders of bone density and structure, unspecified site: Secondary | ICD-10-CM | POA: Diagnosis not present

## 2015-04-17 DIAGNOSIS — R7989 Other specified abnormal findings of blood chemistry: Secondary | ICD-10-CM

## 2015-04-17 DIAGNOSIS — R945 Abnormal results of liver function studies: Secondary | ICD-10-CM

## 2015-04-17 LAB — COMPREHENSIVE METABOLIC PANEL
ALBUMIN: 4.2 g/dL (ref 3.6–5.1)
ALK PHOS: 86 U/L (ref 33–130)
ALT: 53 U/L — AB (ref 6–29)
AST: 44 U/L — ABNORMAL HIGH (ref 10–35)
BILIRUBIN TOTAL: 0.6 mg/dL (ref 0.2–1.2)
BUN: 21 mg/dL (ref 7–25)
CALCIUM: 9.4 mg/dL (ref 8.6–10.4)
CO2: 24 mmol/L (ref 20–31)
Chloride: 107 mmol/L (ref 98–110)
Creat: 0.86 mg/dL (ref 0.60–0.93)
GLUCOSE: 114 mg/dL — AB (ref 65–99)
POTASSIUM: 4.7 mmol/L (ref 3.5–5.3)
Sodium: 140 mmol/L (ref 135–146)
Total Protein: 6.6 g/dL (ref 6.1–8.1)

## 2015-04-17 NOTE — Patient Instructions (Addendum)
Because you received an x-ray today, you will receive an invoice from [facility]. Please contact [facility] at [phone] with questions or concerns regarding your invoice. Our billing staff will not be able to assist you with those questions.   Your appointment for your bone scan is on Tuesday 04/21/15 at 10am.  You need to be there at 945am.  This is a two step process.  At 10am you will be receiving an injection and you will return at 1pm for scan.  The scan will be at [facility] first floor radiology.  Can go through the [facility] entrance and they can guide you where you need to go.

## 2015-04-17 NOTE — Progress Notes (Signed)
Subjective:    Patient ID: Kaylee Weiss, female    DOB: 06-18-1945, 70 y.o.   MRN: [id number]  04/17/2015  Follow-up   HPI This 70 y.o. female presents for evaluation of L hip sclerotic lesion identified during recent bone density scan.  Denies hip pain; has chronic DDD lumbar and deals with chronic daily lower back pain.    Osteopenia:  L forearm radius -2.0; R femur neck -1.9.  FRAX hip fracture risk 3.3%, major osteoporotic fracture risk 18%.  Falls frequently but working on gait stability and has identified that frequently falls on curbs.    Mammogram 2016 3 D at [facility] in past; just completed stool studies negative.    Review of Systems  Constitutional: Negative for fever, chills, diaphoresis and fatigue.  Eyes: Negative for visual disturbance.  Respiratory: Negative for cough and shortness of breath.   Cardiovascular: Negative for chest pain, palpitations and leg swelling.  Gastrointestinal: Negative for nausea, vomiting, abdominal pain, diarrhea and constipation.  Endocrine: Negative for cold intolerance, heat intolerance, polydipsia, polyphagia and polyuria.  Musculoskeletal: Positive for myalgias, back pain, arthralgias and gait problem. Negative for joint swelling.  Neurological: Negative for dizziness, tremors, seizures, syncope, facial asymmetry, speech difficulty, weakness, light-headedness, numbness and headaches.    Past Medical History  Diagnosis Date  . Hypertension   . Fibromyalgia   . Arthritis   . Macular degeneration, right eye   . Pulmonary embolism ([place])     age 37; not on OCP.  Coumadin x 2 years.  . Retinal vein occlusion     age 75.  Marland Kitchen Hyperlipidemia   . Anxiety   . Herpes labialis    Past Surgical History  Procedure Laterality Date  . Hand surgery Left   . Abdominal surgery    . Rotator cuff repair Right    Allergies  Allergen Reactions  . Demerol [Meperidine] Anaphylaxis  . Percocet [Oxycodone-Acetaminophen] Nausea And  Vomiting   Current Outpatient Prescriptions  Medication Sig Dispense Refill  . aspirin 81 MG tablet Take 81 mg by mouth daily.    Marland Kitchen atorvastatin (LIPITOR) 10 MG tablet Take 1 tablet (10 mg total) by mouth daily. 90 tablet 3  . buPROPion (WELLBUTRIN XL) 150 MG 24 hr tablet Take 1 tablet (150 mg total) by mouth daily. 90 tablet 3  . celecoxib (CELEBREX) 200 MG capsule Take 1 capsule (200 mg total) by mouth 2 (two) times daily. 180 capsule 3  . Eflornithine HCl 13.9 % cream Apply 1 application topically 2 (two) times daily with a meal. 45 g 3  . folic acid (FOLVITE) 1 MG tablet Take 1 mg by mouth daily.    Marland Kitchen losartan (COZAAR) 50 MG tablet TAKE 1 TABLET BY MOUTH ONCE DAILY   "OFFICE VISIT NEEDED FOR REFILLS" 90 tablet 0  . Melatonin 5 MG CAPS Take by mouth.    . metoprolol (TOPROL-XL) 200 MG 24 hr tablet Take 1 tablet (200 mg total) by mouth daily. 90 tablet 3  . OVER THE COUNTER MEDICATION OTC B6 100 mgm taking daily    . OVER THE COUNTER MEDICATION OTC preservision areds taking 1 in the am/qhs    . OVER THE COUNTER MEDICATION OTC B12-SL taking everyday    . oxyCODONE-acetaminophen (PERCOCET/ROXICET) 5-325 MG tablet Take 1 tablet by mouth every 4 (four) hours as needed for severe pain. 20 tablet 0  . tolterodine (DETROL LA) 2 MG 24 hr capsule Take 1 capsule (2 mg total) by mouth daily. 90 capsule  3  . valACYclovir (VALTREX) 500 MG tablet Take 1 tablet (500 mg total) by mouth 2 (two) times daily. 180 tablet 3  . zolpidem (AMBIEN) 5 MG tablet Take 1 tablet (5 mg total) by mouth at bedtime as needed for sleep. 30 tablet 5  . OVER THE COUNTER MEDICATION Reported on 04/17/2015     No current facility-administered medications for this visit.   Social History   Social History  . Marital Status: Married    Spouse Name: N/A  . Number of Children: 2  . Years of Education: N/A   Occupational History  . 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 Smoking  status: Never Smoker   . Smokeless tobacco: Not on file  . Alcohol Use: 0.0 oz/week    0 Standard drinks or equivalent per week  . Drug Use: No  . Sexual Activity: No   Other Topics Concern  . Not on file   Social History Narrative   Marital status:  Married x 45 years      Children: 2 children (48, 66); no granchildren      Lives: with husband, german shepard      Employed: works once per week; has a Patent attorney one day per week; previous therapy practice.        Tobacco: never      Alcohol:  Socially; one glass of wine per week      Exercise:  Daily exercise; 3.0 miles per day      Advanced Directives:  FULL CODE; no prolonged measures      ADLs: independent with ADLs.           Family History  Problem Relation Age of Onset  . Heart attack Mother   . Stroke Mother     CVA age 60; repeat at age 32 as cause of death.  . Diabetes Mother   . Heart disease Mother 37    recurrent AMIs  . Hyperlipidemia Mother   . Hypertension Mother   . Diabetes Father   . Heart attack Father   . Cancer Father 67    lung cancer  . Hyperlipidemia Father   . Hypertension Father   . Asthma Son        Objective:    BP 134/80 mmHg  Pulse 55  Temp(Src) 97.6 F (36.4 C) (Oral)  Resp 18  Ht 5\' 5"  (1.651 m)  Wt 147 lb (66.679 kg)  BMI 24.46 kg/m2  SpO2 96% Physical Exam  Constitutional: She is oriented to person, place, and time. She appears well-developed and well-nourished. No distress.  HENT:  Head: Normocephalic and atraumatic.  Eyes: Conjunctivae and EOM are normal. Pupils are equal, round, and reactive to light.  Neck: Normal range of motion. Neck supple. Carotid bruit is not present. No thyromegaly present.  Cardiovascular: Normal rate, regular rhythm, normal heart sounds and intact distal pulses.  Exam reveals no gallop and no friction rub.   No murmur heard. Pulmonary/Chest: Effort normal and breath sounds normal. She has no wheezes. She has no rales.  Abdominal: Soft.  Bowel sounds are normal. She exhibits no distension and no mass. There is no tenderness. There is no rebound and no guarding.  Lymphadenopathy:    She has no cervical adenopathy.  Neurological: She is alert and oriented to person, place, and time. No cranial nerve deficit.  Skin: Skin is warm and dry. No rash noted.  She is not diaphoretic. No erythema. No pallor.  Psychiatric: She has a normal mood and affect. Her behavior is normal.    Dg Ankle Complete Left  04/03/2015  CLINICAL DATA:  Follow-up ankle injury EXAM: LEFT ANKLE COMPLETE - 3+ VIEW COMPARISON:  03/08/2015 FINDINGS: Bone fragment is again noted along the tip of the lateral malleolus. Increasing lucency across the fracture line. The bone fragment is becoming corticated. Continued overlying soft tissue swelling. IMPRESSION: Fracture line or is better visualized and the bone fragment along the bladder malleolar tip is becoming corticated. Continued mild overlying soft tissue swelling. Electronically Signed   By: Rolm Baptise M.D.   On: 04/03/2015 17:17   Dg Bone Density  04/10/2015  EXAM: DUAL X-RAY ABSORPTIOMETRY (DXA) FOR BONE MINERAL DENSITY IMPRESSION: Referring Physician:  Wardell Honour PATIENT: Name: Kaylee Weiss, Kaylee Weiss Patient ID: [id number] Birth Date: 07/10/1945 Height: 64.5 in. Sex: Female Measured: 04/10/2015 Weight: 147.2 lbs. Indications: Advanced Age, Estrogen Deficient, Height Loss (781.91), History of Fracture (Adult) (V15.51), Postmenopausal Fractures: Ankle Treatments: ASSESSMENT: The BMD measured at Forearm Radius 33% is 0.713 g/cm2 with a T-score of -2.0. This patient is considered to be osteopenic according to [facility]) criteria. Lumbar spine was not utilized due to advanced degenerative changes. Site Region Measured Date Measured Age YA BMD Significant CHANGE T-score Left Forearm Radius 33% 04/10/2015 70.5 -2.0 0.713 g/cm2 DualFemur Neck Right 04/10/2015 70.5 -1.9 0.778 g/cm2 World [facility])  criteria for post-menopausal, Caucasian Women: Normal       T-score at or above -1 SD Osteopenia   T-score between -1 and -2.5 SD Osteoporosis T-score at or below -2.5 SD RECOMMENDATION: [place] recommends that FDA-approved medical therapies be considered in postmenopausal women and men age 5 or older with a: 1. Hip or vertebral (clinical or morphometric) fracture. 2. T-score of <-2.5 at the spine or hip. 3. Ten-year fracture probability by FRAX of 3% or greater for hip fracture or 20% or greater for major osteoporotic fracture. All treatment decisions require clinical judgment and consideration of individual patient factors, including patient preferences, co-morbidities, previous drug use, risk factors not captured in the FRAX model (e.g. falls, vitamin D deficiency, increased bone turnover, interval significant decline in bone density) and possible under - or over-estimation of fracture risk by FRAX. All patients should ensure an adequate intake of dietary calcium (1200 mg/d) and vitamin D (800 IU daily) unless contraindicated. FOLLOW-UP: People with diagnosed cases of osteoporosis or at high risk for fracture should have regular bone mineral density tests. For patients eligible for Medicare, routine testing is allowed once every 2 years. The testing frequency can be increased to one year for patients who have rapidly progressing disease, those who are receiving or discontinuing medical therapy to restore bone mass, or have additional risk factors. I have reviewed this report, and agree with the above findings. [facility] FRAX* 10-year Probability of Fracture Based on femoral neck BMD: DualFemur (Right) Major Osteoporotic Fracture: 18.1% Hip Fracture:                3.3% Population:                  [place] (Caucasian) Risk Factors:                History of Fracture (Adult) (V15.51) *FRAX is a Materials engineer of the [employer] for Metabolic Bone  Disease, a World Pharmacologist (WHO) [facility]. ASSESSMENT: The probability of a  major osteoporotic fracture is 18.1 % within the next ten years. The probability of a hip fracture is 3.3 % within the next ten years. 1. Sclerotic focus inferior to the greater trochanter of the left hip. This merits further evaluation with a plain radiographic left hip series. 2. People with diagnosed cases of osteoporosis or at high risk for fracture should have regular bone mineral density tests. For patients eligible for Medicare, routine testing is allowed once every 2 years. The testing frequency can be increased to one year for patients who have rapidly progressing disease, those who are receiving or discontinuing medical therapy to restore bone mass, or have additional risk factors. 3. These results will be called to the ordering clinician or representative by the Radiologist Assistant, and communication documented in the PACS or zVision Dashboard. I have reviewed this report, and agree with the above findings. [facility] FRAX* 10-year Probability of Fracture Based on femoral neck BMD: DualFemur (Right) Major Osteoporotic Fracture: 18.1% Hip Fracture:                3.3% Population:                  [place] (Caucasian) Risk Factors:                History of Fracture (Adult) (V15.51) *FRAX is a Materials engineer of the [employer] for Metabolic Bone Disease, a World Pharmacologist (WHO) [facility]. ASSESSMENT: The probability of a major osteoporotic fracture is 18.1 % within the next ten years. The probability of a hip fracture is 3.3 % within the next ten years. Electronically Signed   By: David  Martinique M.D.   On: 04/10/2015 13:45     Dg Hip Unilat W Or W/o Pelvis 2-3 Views Left  04/17/2015  CLINICAL DATA:  Sclerotic lesion left femur seen on DEXA scan EXAM: DG HIP (WITH OR WITHOUT PELVIS) 2-3V LEFT COMPARISON:  None. FINDINGS: Pelvic bones are intact. No  fracture or dislocation. Mild bilateral hip arthritis. There is a sclerotic bone mass in the intratrochanteric portion of the left femur laterally. It measures 3.5 cm. IMPRESSION: Sclerotic left femur lesion could represent a large bone island or osseous metastasis. Nuclear medicine bone scan recommended to determine if hypermetabolic. Electronically Signed   By: Skipper Cliche M.D.   On: 04/17/2015 09:42   Assessment & Plan:   1. Lytic bone lesion of hip   2. Osteopenia   3. Lumbar and sacral osteoarthritis    1. L hip sclerotic femur lesion: New.  Nuclear bone scan ordered for Tuesday, 04/21/15.  2.  Osteopenia: New.  Pt declines bisphosphonate therapy at this time.  Desires two year trial of exercise, 3 servings of calcium daily, weight bearing exercise. 3.  DDD lumbar: chronic and suffers with chronic daily lower back pain and hip pain.   Orders Placed This Encounter  Procedures  . DG HIP UNILAT W OR W/O PELVIS 2-3 VIEWS LEFT    Standing Status: Future     Number of Occurrences: 1     Standing Expiration Date: 04/16/2016    Order Specific Question:  Reason for Exam (SYMPTOM  OR DIAGNOSIS REQUIRED)    Answer:  L sclerotic lesion on bone density scan; L hip xray recommended    Order Specific Question:  Preferred imaging location?    Answer:  External   No orders of the defined types were placed in this encounter.    No Follow-up on file.  Bannon Giammarco Elayne Guerin, M.D. [facility] [address] [place], [place]  [address] [phone] phone [phone] fax

## 2015-04-20 ENCOUNTER — Telehealth: Payer: Self-pay

## 2015-04-20 ENCOUNTER — Telehealth: Payer: Self-pay | Admitting: Internal Medicine

## 2015-04-20 DIAGNOSIS — H40013 Open angle with borderline findings, low risk, bilateral: Secondary | ICD-10-CM | POA: Diagnosis not present

## 2015-04-20 DIAGNOSIS — H2513 Age-related nuclear cataract, bilateral: Secondary | ICD-10-CM | POA: Diagnosis not present

## 2015-04-20 DIAGNOSIS — H524 Presbyopia: Secondary | ICD-10-CM | POA: Diagnosis not present

## 2015-04-20 NOTE — Telephone Encounter (Signed)
Kaylee Weiss states Dr Tamala Julian sent his wife for some tests and he want to make sure she have 2 phone numbers The first is [phone] and the other is [phone]

## 2015-04-20 NOTE — Telephone Encounter (Signed)
Phone call answ ser 2/13 735pm She thought she was getting a pet sacn and had questions-- Bone scan 2/14 affirmed

## 2015-04-21 ENCOUNTER — Ambulatory Visit: Payer: Medicare Other | Admitting: Podiatry

## 2015-04-21 ENCOUNTER — Ambulatory Visit (HOSPITAL_COMMUNITY)
Admission: RE | Admit: 2015-04-21 | Discharge: 2015-04-21 | Disposition: A | Discharge status: Home / Self Care | Payer: Medicare Other | Source: Ambulatory Visit | Attending: Family Medicine | Admitting: Family Medicine

## 2015-04-21 ENCOUNTER — Encounter: Payer: Self-pay | Admitting: Family Medicine

## 2015-04-21 ENCOUNTER — Encounter (HOSPITAL_COMMUNITY)
Admission: RE | Admit: 2015-04-21 | Discharge: 2015-04-21 | Disposition: A | Discharge status: Home / Self Care | Payer: Medicare Other | Source: Ambulatory Visit | Attending: Family Medicine | Admitting: Family Medicine

## 2015-04-21 DIAGNOSIS — M899 Disorder of bone, unspecified: Secondary | ICD-10-CM | POA: Insufficient documentation

## 2015-04-21 DIAGNOSIS — M898X5 Other specified disorders of bone, thigh: Secondary | ICD-10-CM

## 2015-04-22 ENCOUNTER — Telehealth: Payer: Self-pay

## 2015-04-22 NOTE — Telephone Encounter (Signed)
PA approved for zolpidem 5 mg on covermymeds through 04/21/16, case # [id number]. Notified pharm.

## 2015-04-22 NOTE — Telephone Encounter (Signed)
Pt had advised me that she has been using zolpidem for over 10 yrs w/good effect and no SEs. She had tried trazadone in the past with terrible SEs.

## 2015-04-23 ENCOUNTER — Ambulatory Visit (INDEPENDENT_AMBULATORY_CARE_PROVIDER_SITE_OTHER): Payer: Medicare Other | Admitting: Podiatry

## 2015-04-23 ENCOUNTER — Encounter: Payer: Self-pay | Admitting: Podiatry

## 2015-04-23 VITALS — BP 108/55 | HR 56 | Resp 16

## 2015-04-23 DIAGNOSIS — L600 Ingrowing nail: Secondary | ICD-10-CM

## 2015-04-23 DIAGNOSIS — I209 Angina pectoris, unspecified: Secondary | ICD-10-CM | POA: Insufficient documentation

## 2015-04-23 MED ORDER — NEOMYCIN-POLYMYXIN-HC 1 % OT SOLN: OTIC | Status: DC

## 2015-04-23 NOTE — Progress Notes (Signed)
   Subjective:    Patient ID: Kaylee Weiss, female    DOB: 1945/09/08, 70 y.o.   MRN: [id number]  HPI: She presents today with chief complaint of ingrown toenail tibial border of the hallux right. States it has been present for approximately 3 months and has tried to trim it herself but to no avail.    Review of Systems  All other systems reviewed and are negative.      Objective:   Physical Exam: Vital signs are stable she is alert and oriented 3. I have reviewed her past medical history medications allergies surgery social history and review of systems. Pulses are strongly palpable. Neurologic sensorium is intact. Deep tendon reflexes are intact bilateral and muscle strength is normal bilateral. Orthopedic evaluation and strength all joints distal to the angled full range of motion without crepitation. Cutaneous evaluation of straight supple well-hydrated cutis with exception of sharp incurvated nail margin low tibial border, hallux right.        Assessment & Plan:  Ingrown toenail tibial border hallux right.  Plan: Chemical matrixectomy was performed today after local anesthesia was administered to the tibial border hallux right. She tolerated this procedure well and received a prescription for Cortisporin Otic to be applied twice daily after soaking in Betadine in warm water. Both oral and written home-going instructions were provided for her in the care and soaking of her toe. I will follow up with her in 1 week.

## 2015-04-24 ENCOUNTER — Telehealth: Payer: Self-pay

## 2015-04-24 ENCOUNTER — Other Ambulatory Visit: Payer: Self-pay

## 2015-04-24 NOTE — Progress Notes (Deleted)

## 2015-04-24 NOTE — Telephone Encounter (Signed)
Pt presents to pick up post op instructions for epsom salt soaks

## 2015-04-24 NOTE — Patient Instructions (Signed)

## 2015-04-27 ENCOUNTER — Other Ambulatory Visit: Payer: Self-pay | Admitting: Family Medicine

## 2015-04-27 NOTE — Addendum Note (Signed)
Addended by: Wardell Honour on: 04/27/2015 10:41 PM   Modules accepted: Orders

## 2015-04-28 ENCOUNTER — Encounter: Payer: Self-pay | Admitting: Family Medicine

## 2015-04-28 ENCOUNTER — Ambulatory Visit: Payer: Medicare Other | Admitting: Podiatry

## 2015-04-30 ENCOUNTER — Ambulatory Visit: Payer: Medicare Other | Admitting: Podiatry

## 2015-05-01 ENCOUNTER — Ambulatory Visit
Admission: RE | Admit: 2015-05-01 | Discharge: 2015-05-01 | Disposition: A | Discharge status: Home / Self Care | Payer: Medicare Other | Source: Ambulatory Visit | Attending: Family Medicine | Admitting: Family Medicine

## 2015-05-01 DIAGNOSIS — K8020 Calculus of gallbladder without cholecystitis without obstruction: Secondary | ICD-10-CM | POA: Diagnosis not present

## 2015-05-01 DIAGNOSIS — N200 Calculus of kidney: Secondary | ICD-10-CM | POA: Diagnosis not present

## 2015-05-01 DIAGNOSIS — R7989 Other specified abnormal findings of blood chemistry: Secondary | ICD-10-CM

## 2015-05-01 DIAGNOSIS — R945 Abnormal results of liver function studies: Principal | ICD-10-CM

## 2015-05-04 ENCOUNTER — Encounter: Payer: Self-pay | Admitting: Family Medicine

## 2015-05-05 ENCOUNTER — Ambulatory Visit (INDEPENDENT_AMBULATORY_CARE_PROVIDER_SITE_OTHER): Payer: Medicare Other | Admitting: Podiatry

## 2015-05-05 ENCOUNTER — Encounter: Payer: Self-pay | Admitting: Podiatry

## 2015-05-05 DIAGNOSIS — L600 Ingrowing nail: Secondary | ICD-10-CM

## 2015-05-05 NOTE — Progress Notes (Signed)
She presents today with a follow-up of matrixectomy hallux right. States that she has been soaking in Betadine warm water and she was instructed as well as utilizing Cortisporin otic. She states the toe is doing very well though it still drains some.  Objective: Vital signs are stable she is alert and oriented 3 pulses are strongly palpable. No erythema or edema cellulitis drainage or odor.  Assessment: Well-healing matrixectomy.  Plan: Discontinue Betadine sterile with Epsom salts and warm water soaks covered in the daily leave open at bedtime. Continue Cortisporin Otic. Continue soaks until completely resolved. Notify me with questions or concerns.

## 2015-05-05 NOTE — Patient Instructions (Signed)

## 2015-05-06 ENCOUNTER — Ambulatory Visit (INDEPENDENT_AMBULATORY_CARE_PROVIDER_SITE_OTHER): Payer: Medicare Other | Admitting: Family Medicine

## 2015-05-06 VITALS — BP 120/76 | HR 71 | Temp 98.3°F | Resp 20 | Ht 65.0 in

## 2015-05-06 DIAGNOSIS — K838 Other specified diseases of biliary tract: Secondary | ICD-10-CM

## 2015-05-06 DIAGNOSIS — R059 Cough, unspecified: Secondary | ICD-10-CM

## 2015-05-06 DIAGNOSIS — M25559 Pain in unspecified hip: Secondary | ICD-10-CM

## 2015-05-06 DIAGNOSIS — M7062 Trochanteric bursitis, left hip: Secondary | ICD-10-CM

## 2015-05-06 DIAGNOSIS — H353132 Nonexudative age-related macular degeneration, bilateral, intermediate dry stage: Secondary | ICD-10-CM | POA: Diagnosis not present

## 2015-05-06 DIAGNOSIS — R05 Cough: Secondary | ICD-10-CM | POA: Diagnosis not present

## 2015-05-06 DIAGNOSIS — M25569 Pain in unspecified knee: Secondary | ICD-10-CM

## 2015-05-06 DIAGNOSIS — R7989 Other specified abnormal findings of blood chemistry: Secondary | ICD-10-CM

## 2015-05-06 DIAGNOSIS — E7800 Pure hypercholesterolemia, unspecified: Secondary | ICD-10-CM | POA: Diagnosis not present

## 2015-05-06 DIAGNOSIS — H31092 Other chorioretinal scars, left eye: Secondary | ICD-10-CM | POA: Diagnosis not present

## 2015-05-06 DIAGNOSIS — R945 Abnormal results of liver function studies: Principal | ICD-10-CM

## 2015-05-06 DIAGNOSIS — H43813 Vitreous degeneration, bilateral: Secondary | ICD-10-CM | POA: Diagnosis not present

## 2015-05-06 DIAGNOSIS — R799 Abnormal finding of blood chemistry, unspecified: Secondary | ICD-10-CM | POA: Diagnosis not present

## 2015-05-06 DIAGNOSIS — K8070 Calculus of gallbladder and bile duct without cholecystitis without obstruction: Secondary | ICD-10-CM

## 2015-05-06 NOTE — Progress Notes (Signed)
Subjective:    Patient ID: Kaylee Weiss, female    DOB: 1946/02/21, 70 y.o.   MRN: [id number]  05/06/2015  Follow-up and Hip Pain   HPI This 70 y.o. female presents for evaluation of the following:  1.  Elevated LFTs: stopped Celebrex and Tylenol PM.  S/p abdominal US; revealed gallstones and dilated common bile duct.  Off of Celebrex and Tylenol PM 04/29/15.  Did not stop Lipitor.  Stopped alcohol completely one week ago.  Had increased alcohol recently.  Was drinking one glass of wine qod.  2.  Arthralgias:  Stopped Celebrex; having general discomfort.    3.  Insomnia: taking Ambien which is really sedating pt.  Sleeps for three hours.  4.  L hip pain:  Pain with turning on L hip at night.  Mild L hip OA; history for injection for bursitis; had rheumatology at [facility].  No orthopedist.    5.  Hypercholesterolemia: continues to take Lipitor.    6. L thoracic pain: not as severe as hip pain but persistent.    7. Artrhalgias: has been maintained on Celebrex for ten years; able to function.  Still has discomfort with Celebrex.  Moderately draumatic.  Taking a long time in the morning due to stiffness; mornings are very difficult.  Cannot make the bed in the morning.    8. Cough:  Onset 03/13/2015; mild cough; CXR normal.   Cough has worsened.  Throughout the day; worried about the cat; old cat; ten years.  Coughing has been ongoing one year.  Clear sputum.  Mild rhinorrhea; no nasal congestion.  Nose runs with hot coffee, beer.  Rare sneezing.  No SOB; mild wheezing.  No nighttime cough.  No snoring.  Mild nausea; no indigestion.  No heartburn.   No tuberculosis exposure; last Tb skin test ten years ago.  Son is horribly asthmatic.   Review of Systems Per HPI   Past Medical History  Diagnosis Date  . Hypertension   . Fibromyalgia   . Arthritis   . Macular degeneration, right eye   . Pulmonary embolism ([place])     age 42; not on OCP.  Coumadin x 2 years.  . Retinal vein occlusion    age 5.  Marland Kitchen Hyperlipidemia   . Anxiety   . Herpes labialis    Past Surgical History  Procedure Laterality Date  . Hand surgery Left   . Abdominal surgery    . Rotator cuff repair Right    Allergies  Allergen Reactions  . Demerol [Meperidine] Anaphylaxis  . Other     Alcohol (drinking kind); takes liver enzymes and elevates them  . Percocet [Oxycodone-Acetaminophen] Nausea And Vomiting   Current Outpatient Prescriptions  Medication Sig Dispense Refill  . aspirin 81 MG tablet Take 81 mg by mouth daily.    Marland Kitchen atorvastatin (LIPITOR) 10 MG tablet Take 1 tablet (10 mg total) by mouth daily. 90 tablet 3  . buPROPion (WELLBUTRIN XL) 150 MG 24 hr tablet Take 1 tablet (150 mg total) by mouth daily. 90 tablet 3  . Eflornithine HCl 13.9 % cream Apply 1 application topically 2 (two) times daily with a meal. 45 g 3  . folic acid (FOLVITE) 1 MG tablet Take 1 mg by mouth daily.    Marland Kitchen losartan (COZAAR) 50 MG tablet take 1 tablet by mouth once daily .NEED TO MAKE OFFICE VISIT FOR MORE REFILLS. 90 tablet 0  . Melatonin 5 MG CAPS Take by mouth.    . metoprolol (TOPROL-XL)  200 MG 24 hr tablet Take 1 tablet (200 mg total) by mouth daily. 90 tablet 3  . NEOMYCIN-POLYMYXIN-HYDROCORTISONE (CORTISPORIN) 1 % SOLN otic solution Apply 1-2 drops to toe BID after soaking 10 mL 1  . OVER THE COUNTER MEDICATION OTC B6 100 mgm taking daily    . OVER THE COUNTER MEDICATION OTC preservision areds taking 1 in the am/qhs    . OVER THE COUNTER MEDICATION OTC B12-SL taking everyday    . OVER THE COUNTER MEDICATION Reported on 04/17/2015    . tolterodine (DETROL LA) 2 MG 24 hr capsule Take 1 capsule (2 mg total) by mouth daily. 90 capsule 3  . valACYclovir (VALTREX) 500 MG tablet Take 1 tablet (500 mg total) by mouth 2 (two) times daily. 180 tablet 3  . zolpidem (AMBIEN) 5 MG tablet Take 1 tablet (5 mg total) by mouth at bedtime as needed for sleep. 30 tablet 5  . celecoxib (CELEBREX) 200 MG capsule Take 1 capsule (200 mg  total) by mouth 2 (two) times daily. 180 capsule 3   No current facility-administered medications for this visit.   Social History   Social History  . Marital Status: Married    Spouse Name: N/A  . Number of Children: 2  . Years of Education: N/A   Occupational History  . 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 Smoking status: Never Smoker   . Smokeless tobacco: Not on file  . Alcohol Use: 0.0 oz/week    0 Standard drinks or equivalent per week  . Drug Use: No  . Sexual Activity: No   Other Topics Concern  . Not on file   Social History Narrative   Marital status:  Married x 45 years      Children: 2 children (48, 68); no granchildren      Lives: with husband, german shepard      Employed: works once per week; has a Patent attorney one day per week; previous therapy practice.        Tobacco: never      Alcohol:  Socially; one glass of wine per week      Exercise:  Daily exercise; 3.0 miles per day      Advanced Directives:  FULL CODE; no prolonged measures      ADLs: independent with ADLs.           Family History  Problem Relation Age of Onset  . Heart attack Mother   . Stroke Mother     CVA age 42; repeat at age 90 as cause of death.  . Diabetes Mother   . Heart disease Mother 52    recurrent AMIs  . Hyperlipidemia Mother   . Hypertension Mother   . Diabetes Father   . Heart attack Father   . Cancer Father 53    lung cancer  . Hyperlipidemia Father   . Hypertension Father   . Asthma Son        Objective:    BP 120/76 mmHg  Pulse 71  Temp(Src) 98.3 F (36.8 C) (Oral)  Resp 20  Ht 5\' 5"  (1.651 m)  Wt   SpO2 97% Physical Exam  Constitutional: She is oriented to person, place, and time. She appears well-developed and well-nourished. No distress.  HENT:  Head: Normocephalic and atraumatic.  Right Ear: External ear normal.  Left Ear: External ear normal.  Nose: Nose normal.  Mouth/Throat:  Oropharynx  is clear and moist.  Eyes: Conjunctivae and EOM are normal. Pupils are equal, round, and reactive to light.  Neck: Normal range of motion. Neck supple. Carotid bruit is not present. No thyromegaly present.  Cardiovascular: Normal rate, regular rhythm, normal heart sounds and intact distal pulses.  Exam reveals no gallop and no friction rub.   No murmur heard. Pulmonary/Chest: Effort normal and breath sounds normal. She has no wheezes. She has no rales.  Abdominal: Soft. Bowel sounds are normal. She exhibits no distension and no mass. There is no tenderness. There is no rebound and no guarding.  Lymphadenopathy:    She has no cervical adenopathy.  Neurological: She is alert and oriented to person, place, and time. No cranial nerve deficit.  Skin: Skin is warm and dry. No rash noted. She is not diaphoretic. No erythema. No pallor.  Psychiatric: She has a normal mood and affect. Her behavior is normal.        Assessment & Plan:   1. Elevated LFTs   2. Calculus of gallbladder and bile duct without cholecystitis or obstruction   3. Common bile duct dilation   4. Chronic arthralgias of knees and hips, unspecified laterality   5. Greater trochanteric bursitis, left   6. Pure hypercholesterolemia   7. Cough     Orders Placed This Encounter  Procedures  . Comprehensive metabolic panel   No orders of the defined types were placed in this encounter.    No Follow-up on file.    Viviana Trimble Elayne Guerin, M.D. [facility] [address] [place],   [address] [phone] phone [phone] fax

## 2015-05-06 NOTE — Patient Instructions (Addendum)
1.  Take aspirin one tablet twice daily as needed for now. 2. Recommend seeing Dr. [facility] for a steroid injection in your hip. 3.  Consider xray of upper back if pain persists. 4. If your liver function tests remain elevated today, HOLD Lipitor for one month.   5.  Avoid alcohol. 6.  Once we address the elevated liver function test, we will address the cough.   Because you received labwork today, you will receive an invoice from [employer]. Please contact [facility] at [phone] with questions or concerns regarding your invoice. Our billing staff will not be able to assist you with those questions.  You will be contacted with the lab results as soon as they are available. The fastest way to get your results is to activate your My Chart account. Instructions are located on the last page of this paperwork. If you have not heard from [place] regarding the results in 2 weeks, please contact this office.

## 2015-05-07 LAB — COMPREHENSIVE METABOLIC PANEL
ALT: 29 U/L (ref 6–29)
AST: 27 U/L (ref 10–35)
Albumin: 4.1 g/dL (ref 3.6–5.1)
Alkaline Phosphatase: 80 U/L (ref 33–130)
BUN: 20 mg/dL (ref 7–25)
CALCIUM: 9.4 mg/dL (ref 8.6–10.4)
CO2: 27 mmol/L (ref 20–31)
Chloride: 106 mmol/L (ref 98–110)
Creat: 0.74 mg/dL (ref 0.60–0.93)
GLUCOSE: 86 mg/dL (ref 65–99)
POTASSIUM: 4.2 mmol/L (ref 3.5–5.3)
Sodium: 141 mmol/L (ref 135–146)
Total Bilirubin: 0.5 mg/dL (ref 0.2–1.2)
Total Protein: 6.5 g/dL (ref 6.1–8.1)

## 2015-05-08 ENCOUNTER — Telehealth: Payer: Self-pay | Admitting: Family Medicine

## 2015-05-08 DIAGNOSIS — R300 Dysuria: Secondary | ICD-10-CM

## 2015-05-08 NOTE — Telephone Encounter (Signed)
Pt calling about her lab results.  She stated that Dr. Tamala Julian asked her to call about these results to see if she needs to start on her meds or hold off on them.  Dr. Tamala Julian please advise. Thank You.

## 2015-05-11 ENCOUNTER — Encounter: Payer: Self-pay | Admitting: Family Medicine

## 2015-05-11 NOTE — Telephone Encounter (Signed)
Lab results sent to patient via [place]. Please call patient with lab results from last week and confirm that she has received MyChart message.

## 2015-05-17 NOTE — Telephone Encounter (Signed)
Pt did get message.  She would like to know can she increase her dose of the celebrex because she is having trouble getting around.  You can mychart patient with your response.

## 2015-05-22 ENCOUNTER — Ambulatory Visit (INDEPENDENT_AMBULATORY_CARE_PROVIDER_SITE_OTHER): Payer: Medicare Other | Admitting: Podiatry

## 2015-05-22 ENCOUNTER — Encounter: Payer: Self-pay | Admitting: Podiatry

## 2015-05-22 ENCOUNTER — Telehealth: Payer: Self-pay | Admitting: *Deleted

## 2015-05-22 VITALS — BP 102/67 | HR 52 | Resp 16

## 2015-05-22 DIAGNOSIS — L03032 Cellulitis of left toe: Secondary | ICD-10-CM | POA: Diagnosis not present

## 2015-05-22 DIAGNOSIS — L03011 Cellulitis of right finger: Secondary | ICD-10-CM

## 2015-05-22 NOTE — Telephone Encounter (Signed)
Pt states she had an ingrown procedure performed by Dr. Milinda Pointer about 1 month ago and it appeared to have healed fine, then 2 days ago began to be painful and drain a little.  Pt states she began soaks and this morning pressed a lot of pus from the area.  I spoke with pt and encouraged her to be seen today for treatment.  Pt agreed and was transferred to schedulers.

## 2015-05-22 NOTE — Patient Instructions (Signed)

## 2015-05-25 NOTE — Progress Notes (Signed)
Subjective:     Patient ID: Kaylee Weiss, female   DOB: 19-Feb-1946, 70 y.o.   MRN: [id number]  HPI patient presents stating that the right great toe has some yellowness in the corner and she was concerned about the possibility of infection. It is not hurting currently but she does not like the look   Review of Systems     Objective:   Physical Exam Neurovascular status intact muscle strength adequate with previous history of having ingrown toenail removal that did well with crusted tissue that has just recently form some discharge did not note any localized other issues or no proximal edema erythema or drainage was noted    Assessment:     Localized paronychia infection right hallux doing well    Plan:     Advised patient on soaks and the fact we may need to open this area up and cleaned that if the symptoms were to reoccur persist or any other changes were to occur. Patient is instructed to call us immediately if there's any issues

## 2015-05-29 NOTE — Addendum Note (Signed)
Addended by: Wardell Honour on: 05/29/2015 04:23 PM   Modules accepted: Orders

## 2015-06-05 DIAGNOSIS — K8020 Calculus of gallbladder without cholecystitis without obstruction: Secondary | ICD-10-CM | POA: Insufficient documentation

## 2015-06-05 DIAGNOSIS — K838 Other specified diseases of biliary tract: Secondary | ICD-10-CM | POA: Insufficient documentation

## 2015-06-12 ENCOUNTER — Encounter: Payer: Self-pay | Admitting: Family Medicine

## 2015-06-12 DIAGNOSIS — R945 Abnormal results of liver function studies: Principal | ICD-10-CM

## 2015-06-12 DIAGNOSIS — R7989 Other specified abnormal findings of blood chemistry: Secondary | ICD-10-CM

## 2015-06-17 DIAGNOSIS — M4806 Spinal stenosis, lumbar region: Secondary | ICD-10-CM | POA: Diagnosis not present

## 2015-06-17 DIAGNOSIS — Z7982 Long term (current) use of aspirin: Secondary | ICD-10-CM | POA: Diagnosis not present

## 2015-06-17 DIAGNOSIS — M5416 Radiculopathy, lumbar region: Secondary | ICD-10-CM | POA: Diagnosis not present

## 2015-06-17 DIAGNOSIS — Z79899 Other long term (current) drug therapy: Secondary | ICD-10-CM | POA: Diagnosis not present

## 2015-06-25 ENCOUNTER — Telehealth: Payer: Self-pay | Admitting: Family Medicine

## 2015-06-25 NOTE — Telephone Encounter (Signed)
-----   Message from Wardell Honour, MD sent at 06/05/2015 12:11 PM EDT ----- Please schedule with Dr. Carlota Raspberry to address hip pain/trochanteric bursitis.

## 2015-06-25 NOTE — Telephone Encounter (Signed)
Pt stil having hip pain. Aspirin helps. Pt just had spine injection @ [facility]/Lidocaine.She notes improvement in her hip pain. She wil call or walk in if pain doesn't continue to improve.

## 2015-06-28 ENCOUNTER — Other Ambulatory Visit (INDEPENDENT_AMBULATORY_CARE_PROVIDER_SITE_OTHER): Payer: Medicare Other | Admitting: *Deleted

## 2015-06-28 DIAGNOSIS — R300 Dysuria: Secondary | ICD-10-CM

## 2015-06-28 DIAGNOSIS — R7989 Other specified abnormal findings of blood chemistry: Secondary | ICD-10-CM

## 2015-06-28 DIAGNOSIS — R945 Abnormal results of liver function studies: Secondary | ICD-10-CM

## 2015-06-28 DIAGNOSIS — R799 Abnormal finding of blood chemistry, unspecified: Secondary | ICD-10-CM | POA: Diagnosis not present

## 2015-06-28 LAB — POCT URINALYSIS DIP (MANUAL ENTRY)
BILIRUBIN UA: NEGATIVE
BILIRUBIN UA: NEGATIVE
Blood, UA: NEGATIVE
Glucose, UA: NEGATIVE
LEUKOCYTES UA: NEGATIVE
NITRITE UA: NEGATIVE
PROTEIN UA: NEGATIVE
Spec Grav, UA: 1.015
Urobilinogen, UA: 1
pH, UA: 6.5

## 2015-06-28 LAB — COMPREHENSIVE METABOLIC PANEL
ALT: 26 U/L (ref 6–29)
AST: 25 U/L (ref 10–35)
Albumin: 3.8 g/dL (ref 3.6–5.1)
Alkaline Phosphatase: 81 U/L (ref 33–130)
BILIRUBIN TOTAL: 0.6 mg/dL (ref 0.2–1.2)
BUN: 21 mg/dL (ref 7–25)
CALCIUM: 8.9 mg/dL (ref 8.6–10.4)
CHLORIDE: 108 mmol/L (ref 98–110)
CO2: 28 mmol/L (ref 20–31)
Creat: 0.91 mg/dL (ref 0.60–0.93)
GLUCOSE: 87 mg/dL (ref 65–99)
Potassium: 4.5 mmol/L (ref 3.5–5.3)
SODIUM: 142 mmol/L (ref 135–146)
Total Protein: 6.4 g/dL (ref 6.1–8.1)

## 2015-06-28 LAB — POC MICROSCOPIC URINALYSIS (UMFC): MUCUS RE: ABSENT

## 2015-06-29 LAB — URINE CULTURE
COLONY COUNT: NO GROWTH
ORGANISM ID, BACTERIA: NO GROWTH

## 2015-07-02 ENCOUNTER — Encounter: Payer: Self-pay | Admitting: Family Medicine

## 2015-07-07 DIAGNOSIS — M4806 Spinal stenosis, lumbar region: Secondary | ICD-10-CM | POA: Diagnosis not present

## 2015-07-07 DIAGNOSIS — M5416 Radiculopathy, lumbar region: Secondary | ICD-10-CM | POA: Diagnosis not present

## 2015-07-07 DIAGNOSIS — M7062 Trochanteric bursitis, left hip: Secondary | ICD-10-CM | POA: Diagnosis not present

## 2015-07-15 ENCOUNTER — Ambulatory Visit (INDEPENDENT_AMBULATORY_CARE_PROVIDER_SITE_OTHER): Payer: Medicare Other | Admitting: Family Medicine

## 2015-07-15 VITALS — BP 110/68 | HR 64 | Temp 97.6°F | Resp 18

## 2015-07-15 DIAGNOSIS — G834 Cauda equina syndrome: Secondary | ICD-10-CM | POA: Diagnosis not present

## 2015-07-15 DIAGNOSIS — R7989 Other specified abnormal findings of blood chemistry: Secondary | ICD-10-CM | POA: Diagnosis not present

## 2015-07-15 DIAGNOSIS — M5136 Other intervertebral disc degeneration, lumbar region: Secondary | ICD-10-CM

## 2015-07-15 DIAGNOSIS — G4700 Insomnia, unspecified: Secondary | ICD-10-CM | POA: Diagnosis not present

## 2015-07-15 DIAGNOSIS — R945 Abnormal results of liver function studies: Secondary | ICD-10-CM

## 2015-07-15 DIAGNOSIS — N3946 Mixed incontinence: Secondary | ICD-10-CM | POA: Diagnosis not present

## 2015-07-15 MED ORDER — EFLORNITHINE HCL 13.9 % EX CREA: 1.0000 "application " | TOPICAL_CREAM | Freq: Two times a day (BID) | CUTANEOUS | Status: DC

## 2015-07-15 MED ORDER — GABAPENTIN 100 MG PO CAPS: 100.0000 mg | ORAL_CAPSULE | Freq: Three times a day (TID) | ORAL | Status: DC

## 2015-07-15 NOTE — Progress Notes (Signed)
Subjective:    Patient ID: Kaylee Weiss, female    DOB: 01-28-1946, 70 y.o.   MRN: [id number]  07/15/2015  Follow-up   HPI This 70 y.o. female presents for follow-up:   1. L hip pain: took 5 weeks to have appointment with Dr. Carlota Raspberry for hip injection; declined appointment because had just received back injection.  2. Lower back pain/DDD lumbar spine: s/p injection in lumbar spine; orthopedist injected hip.  Then started getting knee pain back; was provided with questions for provider during injection visit.  Had really been suffering with lower back pain. Ortho started on Neurontin one week ago; wants to take 3 Neurontin 300mg  daily; can take one at night and sleeps well.  Husband commented that patient able to get out of bed in morning and stand without difficulty; baseline was it took 30 minutes to get out of bed.  Very foggy in the morning. With learning disability, always has problems saying words.  Now, can see words written in mind.  Son who has same learning disability, he reads what he sees.  This started with Neurontin. Not sure why anyone has suggested before.  Not taking Ambien at all now since sleeping with Neurontin.  Does not have back pain but suffers with horrible knee pain which orthopedist insists knee pain is from radicular pain.  Pain with injection was severe.  With injection, had pain down to toes and perineum was on fire.  Orthopedist was pleased with reaction with injection.  Still biggest pain is located at knee.  Neurontin completely controls pain. Has one spot in hip; not sure if left over from bursitis.  Not sure of next appointment; no suggestion.  If pain worsens, call for appointment.  Likes consultation before injecting back. This last time, pain was intolerable so worked in.  Within one week, lost tolerance to pain.  Pain had really escalated.  Felt desperate with pain this last episode.  Pain free currently with neurontin 300mg  qhs.  Tolerating brain fog.  Medicine  really hits hard; getting 6-7hours of sleep; bedtime at 12:37midnight; foggy until 11:00am.  Gets nauseated with Neurontin so does not want to take earlier.  Gets really hot with Neurontin.  Will not drive until [id number].    3.  Urinary incontinence: low dose Detrol is great.   Review of Systems  Constitutional: Negative for fever, chills, diaphoresis and fatigue.  Eyes: Negative for visual disturbance.  Respiratory: Negative for cough and shortness of breath.   Cardiovascular: Negative for chest pain, palpitations and leg swelling.  Gastrointestinal: Negative for nausea, vomiting, abdominal pain, diarrhea and constipation.  Endocrine: Negative for cold intolerance, heat intolerance, polydipsia, polyphagia and polyuria.  Genitourinary: Positive for urgency and frequency. Negative for dysuria.  Musculoskeletal: Positive for myalgias, back pain, joint swelling, arthralgias and gait problem.  Neurological: Negative for dizziness, tremors, seizures, syncope, facial asymmetry, speech difficulty, weakness, light-headedness, numbness and headaches.    Past Medical History  Diagnosis Date  . Hypertension   . Fibromyalgia   . Arthritis   . Macular degeneration, right eye   . Pulmonary embolism ([place])     age 83; not on OCP.  Coumadin x 2 years.  . Retinal vein occlusion     age 48.  Mar[place] Kitchen Hyperlipidemia   . Anxiety   . Herpes labialis    Past Surgical History  Procedure Laterality Date  . Hand surgery Left   . Abdominal surgery    . Rotator cuff repair Right  Allergies  Allergen Reactions  . Demerol [Meperidine] Anaphylaxis  . Other     Alcohol (drinking kind); takes liver enzymes and elevates them  . Percocet [Oxycodone-Acetaminophen] Nausea And Vomiting    Social History   Social History  . Marital Status: Married    Spouse Name: N/A  . Number of Children: 2  . Years of Education: N/A   Occupational History  . 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 Smoking status: Never Smoker   . Smokeless tobacco: Not on file  . Alcohol Use: 0.0 oz/week    0 Standard drinks or equivalent per week  . Drug Use: No  . Sexual Activity: No   Other Topics Concern  . 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        Tobacco: never      Alcohol:  Socially; one glass of wine per week      Exercise:  Daily exercise; 3.0 miles per day      Advanced Directives:  FULL CODE; no prolonged measures      ADLs: independent with ADLs.           Family History  Problem Relation Age of Onset  . Heart attack Mother   . Stroke Mother     CVA age 74; repeat at age 71 as cause of death.  . Diabetes Mother   . Heart disease Mother 10    recurrent AMIs  . Hyperlipidemia Mother   . Hypertension Mother   . Diabetes Father   . Heart attack Father   . Cancer Father 27    lung cancer  . Hyperlipidemia Father   . Hypertension Father   . Asthma Son        Objective:    BP 110/68 mmHg  Pulse 64  Temp(Src) 97.6 F (36.4 C) (Oral)  Resp 18  SpO2 97% Physical Exam  Constitutional: She is oriented to person, place, and time. She appears well-developed and well-nourished. No distress.  HENT:  Head: Normocephalic and atraumatic.  Right Ear: External ear normal.  Left Ear: External ear normal.  Nose: Nose normal.  Mouth/Throat: Oropharynx is clear and moist.  Eyes: Conjunctivae and EOM are normal. Pupils are equal, round, and reactive to light.  Neck: Normal range of motion. Neck supple. Carotid bruit is not present. No thyromegaly present.  Cardiovascular: Normal rate, regular rhythm, normal heart sounds and intact distal pulses.  Exam reveals no gallop and no friction rub.   No murmur  heard. Pulmonary/Chest: Effort normal and breath sounds normal. She has no wheezes. She has no rales.  Abdominal: Soft. Bowel sounds are normal. She exhibits no distension and no mass. There is no tenderness. There is no rebound and no guarding.  Musculoskeletal:       Lumbar back: She exhibits pain. She exhibits normal range of motion, no tenderness, no bony tenderness, no spasm and normal pulse.  Lymphadenopathy:    She has no cervical adenopathy.  Neurological: She is alert and oriented to person, place, and time. No cranial nerve deficit.  Skin: Skin is warm and dry. No  rash noted. She is not diaphoretic. No erythema. No pallor.  Psychiatric: She has a normal mood and affect. Her behavior is normal.   Results for orders placed or performed in visit on 06/28/15  Urine culture  Result Value Ref Range   Colony Count NO GROWTH    Organism ID, Bacteria NO GROWTH   Comprehensive metabolic panel  Result Value Ref Range   Sodium 142 135 - 146 mmol/L   Potassium 4.5 3.5 - 5.3 mmol/L   Chloride 108 98 - 110 mmol/L   CO2 28 20 - 31 mmol/L   Glucose, Bld 87 65 - 99 mg/dL   BUN 21 7 - 25 mg/dL   Creat 0.91 0.60 - 0.93 mg/dL   Total Bilirubin 0.6 0.2 - 1.2 mg/dL   Alkaline Phosphatase 81 33 - 130 U/L   AST 25 10 - 35 U/L   ALT 26 6 - 29 U/L   Total Protein 6.4 6.1 - 8.1 g/dL   Albumin 3.8 3.6 - 5.1 g/dL   Calcium 8.9 8.6 - 10.4 mg/dL  POCT urinalysis dipstick  Result Value Ref Range   Color, UA yellow yellow   Clarity, UA cloudy (A) clear   Glucose, UA negative negative   Bilirubin, UA negative negative   Ketones, POC UA negative negative   Spec Grav, UA 1.015    Blood, UA negative negative   pH, UA 6.5    Protein Ur, POC negative negative   Urobilinogen, UA 1.0    Nitrite, UA Negative Negative   Leukocytes, UA Negative Negative  POCT Microscopic Urinalysis ([facility])  Result Value Ref Range   WBC,UR,HPF,POC None None WBC/hpf   RBC,UR,HPF,POC None None RBC/hpf   Bacteria None  None, Too numerous to count   Mucus Absent Absent   Epithelial Cells, UR Per Microscopy Few (A) None, Too numerous to count cells/hpf       Assessment & Plan:   1. Degenerative disc disease, lumbar   2. Mixed incontinence   3. Elevated LFTs   4. Insomnia   5. Cauda equina syndrome (HCC)    -improving with Gabapentin 300mg  qhs. -rx for Gabapentin 100mg  tid provided due to sedation with 300mg  qhs.  -LFTs are normal; urine culture negative. -urinary symptoms tolerable with current medication. -refer to local orthopedist for ongoing injections and management of chronic lower back pain. -now sleeping well with Gabapentin and not needing Ambien.   Orders Placed This Encounter  Procedures  . AMB referral to orthopedics    Referral Priority:  Routine    Referral Type:  Consultation    Number of Visits Requested:  1   Meds ordered this encounter  Medications  . gabapentin (NEURONTIN) 300 MG capsule    Sig: Take 300 mg by mouth once.  . gabapentin (NEURONTIN) 100 MG capsule    Sig: Take 1 capsule (100 mg total) by mouth 3 (three) times daily.    Dispense:  90 capsule    Refill:  3  . Eflornithine HCl 13.9 % cream    Sig: Apply 1 application topically 2 (two) times daily with a meal.    Dispense:  45 g    Refill:  3    No Follow-up on file.    Sasan Wilkie Elayne Guerin, M.D. [facility] [address] [place], [place]  [id number] [phone] phone [phone] fax

## 2015-07-15 NOTE — Patient Instructions (Signed)
     IF you received an x-ray today, you will receive an invoice from [facility]. Please contact [facility] at [phone] with questions or concerns regarding your invoice.   IF you received labwork today, you will receive an invoice from [facility]. Please contact [facility] at [phone] with questions or concerns regarding your invoice.   Our billing staff will not be able to assist you with questions regarding bills from these companies.  You will be contacted with the lab results as soon as they are available. The fastest way to get your results is to activate your My Chart account. Instructions are located on the last page of this paperwork. If you have not heard from us regarding the results in 2 weeks, please contact this office.      

## 2015-07-21 ENCOUNTER — Other Ambulatory Visit: Payer: Self-pay | Admitting: Family Medicine

## 2015-07-29 DIAGNOSIS — M4806 Spinal stenosis, lumbar region: Secondary | ICD-10-CM | POA: Diagnosis not present

## 2015-07-29 DIAGNOSIS — M7062 Trochanteric bursitis, left hip: Secondary | ICD-10-CM | POA: Diagnosis not present

## 2015-08-08 ENCOUNTER — Other Ambulatory Visit: Payer: Self-pay | Admitting: Family Medicine

## 2015-08-10 ENCOUNTER — Other Ambulatory Visit: Payer: Self-pay | Admitting: Family Medicine

## 2015-08-10 ENCOUNTER — Ambulatory Visit (INDEPENDENT_AMBULATORY_CARE_PROVIDER_SITE_OTHER): Payer: Medicare Other

## 2015-08-10 ENCOUNTER — Ambulatory Visit (INDEPENDENT_AMBULATORY_CARE_PROVIDER_SITE_OTHER): Payer: Medicare Other | Admitting: Family Medicine

## 2015-08-10 VITALS — BP 120/72 | HR 60 | Temp 97.8°F | Resp 18 | Ht 65.0 in

## 2015-08-10 DIAGNOSIS — M76892 Other specified enthesopathies of left lower limb, excluding foot: Secondary | ICD-10-CM | POA: Diagnosis not present

## 2015-08-10 DIAGNOSIS — M25552 Pain in left hip: Secondary | ICD-10-CM | POA: Diagnosis not present

## 2015-08-10 DIAGNOSIS — G834 Cauda equina syndrome: Secondary | ICD-10-CM

## 2015-08-10 MED ORDER — PREDNISONE 20 MG PO TABS: ORAL_TABLET | ORAL | Status: DC

## 2015-08-10 MED ORDER — LIDOCAINE 5 % EX PTCH: 1.0000 | MEDICATED_PATCH | CUTANEOUS | Status: DC

## 2015-08-10 NOTE — Patient Instructions (Addendum)
IF you received an x-ray today, you will receive an invoice from [facility]. Please contact [facility] at [phone] with questions or concerns regarding your invoice.   IF you received labwork today, you will receive an invoice from [employer]. Please contact [facility] at [phone] with questions or concerns regarding your invoice.   Our billing staff will not be able to assist you with questions regarding bills from these companies.  You will be contacted with the lab results as soon as they are available. The fastest way to get your results is to activate your My Chart account. Instructions are located on the last page of this paperwork. If you have not heard from [place] regarding the results in 2 weeks, please contact this office.        IF you received an x-ray today, you will receive an invoice from [facility]. Please contact [facility] at [phone] with questions or concerns regarding your invoice.   IF you received labwork today, you will receive an invoice from [employer]. Please contact [facility] at [phone] with questions or concerns regarding your invoice.   Our billing staff will not be able to assist you with questions regarding bills from these companies.  You will be contacted with the lab results as soon as they are available. The fastest way to get your results is to activate your My Chart account. Instructions are located on the last page of this paperwork. If you have not heard from [place] regarding the results in 2 weeks, please contact this office.      Trochanteric Bursitis You have hip pain due to trochanteric bursitis. Bursitis means that the sack near the outside of the hip is filled with fluid and inflamed. This sack is made up of protective soft tissue. The pain from trochanteric bursitis can be severe and keep you from sleep. It can radiate to the buttocks or  down the outside of the thigh to the knee. The pain is almost always worse when rising from the seated or lying position and with walking. Pain can improve after you take a few steps. It happens more often in people with hip joint and lumbar spine problems, such as arthritis or previous surgery. Very rarely the trochanteric bursa can become infected, and antibiotics and/or surgery may be needed. Treatment often includes an injection of local anesthetic mixed with cortisone medicine. This medicine is injected into the area where it is most tender over the hip. Repeat injections may be necessary if the response to treatment is slow. You can apply ice packs over the tender area for 30 minutes every 2 hours for the next few days. Anti-inflammatory and/or narcotic pain medicine may also be helpful. Limit your activity for the next few days if the pain continues. See your caregiver in 5-10 days if you are not greatly improved.  SEEK IMMEDIATE MEDICAL CARE IF:  You develop severe pain, fever, or increased redness.  You have pain that radiates below the knee. EXERCISES STRETCHING EXERCISES - Trochanteric Bursitis  These exercises may help you when beginning to rehabilitate your injury. Your symptoms may resolve with or without further involvement from your physician, physical therapist, or athletic trainer. While completing these exercises, remember:   Restoring tissue flexibility helps normal motion to return to the joints. This allows healthier, less painful movement and activity.  An effective stretch should be held for at least 30 seconds.  A stretch should never be painful. You  should only feel a gentle lengthening or release in the stretched tissue. STRETCH - Iliotibial Band  On the floor or bed, lie on your side so your injured leg is on top. Bend your knee and grab your ankle.  Slowly bring your knee back so that your thigh is in line with your trunk. Keep your heel at your buttocks and gently  arch your back so your head, shoulders and hips line up.  Slowly lower your leg so that your knee approaches the floor/bed until you feel a gentle stretch on the outside of your thigh. If you do not feel a stretch and your knee will not fall farther, place the heel of your opposite foot on top of your knee and pull your thigh down farther.  Hold this stretch for __________ seconds.  Repeat __________ times. Complete this exercise __________ times per day. STRETCH - Hamstrings, Supine   Lie on your back. Loop a belt or towel over the ball of your foot as shown.  Straighten your knee and slowly pull on the belt to raise your injured leg. Do not allow the knee to bend. Keep your opposite leg flat on the floor.  Raise the leg until you feel a gentle stretch behind your knee or thigh. Hold this position for __________ seconds.  Repeat __________ times. Complete this stretch __________ times per day. STRETCH - Quadriceps, Prone   Lie on your stomach on a firm surface, such as a bed or padded floor.  Bend your knee and grasp your ankle. If you are unable to reach your ankle or pant leg, use a belt around your foot to lengthen your reach.  Gently pull your heel toward your buttocks. Your knee should not slide out to the side. You should feel a stretch in the front of your thigh and/or knee.  Hold this position for __________ seconds.  Repeat __________ times. Complete this stretch __________ times per day. STRETCHING - Hip Flexors, Lunge Half kneel with your knee on the floor and your opposite knee bent and directly over your ankle.  Keep good posture with your head over your shoulders. Tighten your buttocks to point your tailbone downward; this will prevent your back from arching too much.  You should feel a gentle stretch in the front of your thigh and/or hip. If you do not feel any resistance, slightly slide your opposite foot forward and then slowly lunge forward so your knee once again  lines up over your ankle. Be sure your tailbone remains pointed downward.  Hold this stretch for __________ seconds.  Repeat __________ times. Complete this stretch __________ times per day. STRETCH - Adductors, Lunge  While standing, spread your legs.  Lean away from your injured leg by bending your opposite knee. You may rest your hands on your thigh for balance.  You should feel a stretch in your inner thigh. Hold for __________ seconds.  Repeat __________ times. Complete this exercise __________ times per day.   This information is not intended to replace advice given to you by your health care provider. Make sure you discuss any questions you have with your health care provider.   Document Released: 03/31/2004 Document Revised: 07/08/2014 Document Reviewed: 06/05/2008 Elsevier Interactive Patient Education [employer].

## 2015-08-10 NOTE — Progress Notes (Signed)
Subjective:    Patient ID: Kaylee Weiss, female    DOB: 20-Sep-1945, 70 y.o.   MRN: [id number]  08/10/2015  Follow-up   HPI This 70 y.o. female presents for evaluation of persistent radicular pain.  S/p repeat injection by ortho.  Has experimented with a variety of doses of Gabapentin.  Sleeping well but not functional in morning.  Has a burning spot in lateral aspect of L distal calf.  Gabapentin 100mg  causes nausea, sedation.  Injected hip; he injected again.  L buttocks hurts.  Having severe pain.  Hurts so much.  Has been months of pain.  New ortho did not say much of anything; s/p injection last week; discussed neurostimulator to keep mobility.  [place] did not discuss neurostimulator at all.  Rotation internal and external causes pain.  Assumed that bursitis.  Having trouble walking with friends.  Getting around is becoming a struggle.  Posterior hip on L.  Buttocks pain.  Does not feel like burning with sciatica.  L calf burning is separate from hip.  Wang.  Taking Celebrex 200mg  bid.  S/p L hip xray negative in 04/2015.  Cannot tolerate narcotics due to nausea.     Review of Systems  Constitutional: Negative for fever, chills, diaphoresis and fatigue.  Eyes: Negative for visual disturbance.  Respiratory: Negative for cough and shortness of breath.   Cardiovascular: Negative for chest pain, palpitations and leg swelling.  Gastrointestinal: Negative for nausea, vomiting, abdominal pain, diarrhea and constipation.  Endocrine: Negative for cold intolerance, heat intolerance, polydipsia, polyphagia and polyuria.  Musculoskeletal: Positive for myalgias, back pain and arthralgias.  Neurological: Positive for numbness. Negative for dizziness, tremors, seizures, syncope, facial asymmetry, speech difficulty, weakness, light-headedness and headaches.    Past Medical History  Diagnosis Date  . Hypertension   . Fibromyalgia   . Arthritis   . Macular degeneration, right eye   . Pulmonary embolism  ([place])     age 25; not on OCP.  Coumadin x 2 years.  . Retinal vein occlusion     age 28.  Marland Kitchen Hyperlipidemia   . Anxiety   . Herpes labialis    Past Surgical History  Procedure Laterality Date  . Hand surgery Left   . Abdominal surgery    . Rotator cuff repair Right    Allergies  Allergen Reactions  . Demerol [Meperidine] Anaphylaxis  . Other     Alcohol (drinking kind); takes liver enzymes and elevates them  . Percocet [Oxycodone-Acetaminophen] Nausea And Vomiting    Social History   Social History  . Marital Status: Married    Spouse Name: N/A  . Number of Children: 2  . Years of Education: N/A   Occupational History  . 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 Smoking status: Never Smoker   . Smokeless tobacco: Not on file  . Alcohol Use: 0.0 oz/week    0 Standard drinks or equivalent per week  . Drug Use: No  . Sexual Activity: No   Other Topics Concern  . Not on file   Social History Narrative   Marital status:  Married x 45 years      Children: 2 children (48, 93); no granchildren      Lives: with husband, german shepard      Employed: works once per week; has a Patent attorney one day per week; previous therapy practice.        Tobacco:  never      Alcohol:  Socially; one glass of wine per week      Exercise:  Daily exercise; 3.0 miles per day      Advanced Directives:  FULL CODE; no prolonged measures      ADLs: independent with ADLs.           Family History  Problem Relation Age of Onset  . Heart attack Mother   . Stroke Mother     CVA age 53; repeat at age 48 as cause of death.  . Diabetes Mother   . Heart disease Mother 69    recurrent AMIs  . Hyperlipidemia Mother   . Hypertension Mother   . Diabetes Father   . Heart attack Father   . Cancer Father 75    lung cancer  . Hyperlipidemia Father   . Hypertension Father   . Asthma Son        Objective:    BP 120/72 mmHg  Pulse 60   Temp(Src) 97.8 F (36.6 C) (Oral)  Resp 18  Ht 5\' 5"  (1.651 m)  SpO2 96% Physical Exam  Constitutional: She is oriented to person, place, and time. She appears well-developed and well-nourished. No distress.  HENT:  Head: Normocephalic and atraumatic.  Right Ear: External ear normal.  Left Ear: External ear normal.  Nose: Nose normal.  Mouth/Throat: Oropharynx is clear and moist.  Eyes: Conjunctivae and EOM are normal. Pupils are equal, round, and reactive to light.  Neck: Normal range of motion. Neck supple. Carotid bruit is not present. No thyromegaly present.  Cardiovascular: Normal rate, regular rhythm, normal heart sounds and intact distal pulses.  Exam reveals no gallop and no friction rub.   No murmur heard. Pulmonary/Chest: Effort normal and breath sounds normal. She has no wheezes. She has no rales.  Abdominal: Soft. Bowel sounds are normal. She exhibits no distension and no mass. There is no tenderness. There is no rebound and no guarding.  Musculoskeletal:       Left hip: She exhibits tenderness and bony tenderness. She exhibits normal range of motion and normal strength.       Lumbar back: She exhibits decreased range of motion and pain. She exhibits no tenderness, no bony tenderness, no spasm and normal pulse.  Lymphadenopathy:    She has no cervical adenopathy.  Neurological: She is alert and oriented to person, place, and time. No cranial nerve deficit.  Skin: Skin is warm and dry. No rash noted. She is not diaphoretic. No erythema. No pallor.  Psychiatric: She has a normal mood and affect. Her behavior is normal.   Results for orders placed or performed in visit on 06/28/15  Urine culture  Result Value Ref Range   Colony Count NO GROWTH    Organism ID, Bacteria NO GROWTH   Comprehensive metabolic panel  Result Value Ref Range   Sodium 142 135 - 146 mmol/L   Potassium 4.5 3.5 - 5.3 mmol/L   Chloride 108 98 - 110 mmol/L   CO2 28 20 - 31 mmol/L   Glucose, Bld 87 65  - 99 mg/dL   BUN 21 7 - 25 mg/dL   Creat 0.91 0.60 - 0.93 mg/dL   Total Bilirubin 0.6 0.2 - 1.2 mg/dL   Alkaline Phosphatase 81 33 - 130 U/L   AST 25 10 - 35 U/L   ALT 26 6 - 29 U/L   Total Protein 6.4 6.1 - 8.1 g/dL   Albumin 3.8 3.6 - 5.1  g/dL   Calcium 8.9 8.6 - 10.4 mg/dL  POCT urinalysis dipstick  Result Value Ref Range   Color, UA yellow yellow   Clarity, UA cloudy (A) clear   Glucose, UA negative negative   Bilirubin, UA negative negative   Ketones, POC UA negative negative   Spec Grav, UA 1.015    Blood, UA negative negative   pH, UA 6.5    Protein Ur, POC negative negative   Urobilinogen, UA 1.0    Nitrite, UA Negative Negative   Leukocytes, UA Negative Negative  POCT Microscopic Urinalysis ([facility])  Result Value Ref Range   WBC,UR,HPF,POC None None WBC/hpf   RBC,UR,HPF,POC None None RBC/hpf   Bacteria None None, Too numerous to count   Mucus Absent Absent   Epithelial Cells, UR Per Microscopy Few (A) None, Too numerous to count cells/hpf       Assessment & Plan:   1. Left hip pain   2. Cauda equina syndrome ([place])   3. Enthesopathy of hip, left     Orders Placed This Encounter  Procedures  . DG HIP UNILAT W OR W/O PELVIS 2-3 VIEWS LEFT    Standing Status: Future     Number of Occurrences: 1     Standing Expiration Date: 08/09/2016    Order Specific Question:  Reason for Exam (SYMPTOM  OR DIAGNOSIS REQUIRED)    Answer:  L hip pain with external rotation    Order Specific Question:  Preferred imaging location?    Answer:  External   Meds ordered this encounter  Medications  . lidocaine (LIDODERM) 5 %    Sig: Place 1-3 patches onto the skin daily. Remove & Discard patch within 12 hours or as directed by MD    Dispense:  30 patch    Refill:  5  . predniSONE (DELTASONE) 20 MG tablet    Sig: Two tablets daily x 5 days then one tablet daily x 5 days    Dispense:  15 tablet    Refill:  0    No Follow-up on file.    Ames Hoban Elayne Guerin, M.D. [facility] [address] [place], [place]  [address] [phone] phone [phone] fax

## 2015-08-11 ENCOUNTER — Telehealth: Payer: Self-pay

## 2015-08-11 NOTE — Telephone Encounter (Signed)
PA completed on covermymeds for lidocaine patches. Pending, but this will be denied for pt's Dx. Only covered for post hepatic neuralgia, and sometimes for diabetic neuropathy. I will advise pharm to have pt pay cash or try the OTC 4% lidocaine patches, but is there anything else you want to Rx?

## 2015-08-11 NOTE — Telephone Encounter (Signed)
No.  I did discuss with patient that insurance would likely NOT pay for Lidocaine patches. I also discussed with her the Lidocaine OTC patches.  Please advise patient that insurance will likely not pay for patches and remind her of the 4% patches lidocaine OTC.

## 2015-08-12 NOTE — Telephone Encounter (Signed)
Spoke to pt and advised PA was denied. Pt agreed she would go to get the OTC 4% patches.

## 2015-09-11 ENCOUNTER — Other Ambulatory Visit: Payer: Self-pay | Admitting: Family Medicine

## 2015-09-14 NOTE — Telephone Encounter (Signed)
Rxs approved for refill.

## 2015-09-14 NOTE — Telephone Encounter (Signed)
Dr Tamala Julian, pt has appt sch for 11/17/15. OK to give 90 day RFs to cover her until then?

## 2015-09-15 DIAGNOSIS — M76892 Other specified enthesopathies of left lower limb, excluding foot: Secondary | ICD-10-CM | POA: Diagnosis not present

## 2015-09-15 DIAGNOSIS — M797 Fibromyalgia: Secondary | ICD-10-CM | POA: Diagnosis not present

## 2015-09-15 DIAGNOSIS — M545 Low back pain, unspecified: Secondary | ICD-10-CM | POA: Insufficient documentation

## 2015-09-15 DIAGNOSIS — G8929 Other chronic pain: Secondary | ICD-10-CM | POA: Diagnosis not present

## 2015-09-15 DIAGNOSIS — M5416 Radiculopathy, lumbar region: Secondary | ICD-10-CM | POA: Diagnosis not present

## 2015-09-15 DIAGNOSIS — G834 Cauda equina syndrome: Secondary | ICD-10-CM | POA: Diagnosis not present

## 2015-09-15 DIAGNOSIS — M4806 Spinal stenosis, lumbar region: Secondary | ICD-10-CM | POA: Diagnosis not present

## 2015-09-18 ENCOUNTER — Other Ambulatory Visit: Payer: Self-pay | Admitting: Family Medicine

## 2015-09-22 NOTE — Telephone Encounter (Signed)
Please fax or call in refill of Ambien as approved.

## 2015-09-23 NOTE — Telephone Encounter (Signed)
Faxed

## 2015-09-29 ENCOUNTER — Encounter: Payer: Self-pay | Admitting: Family Medicine

## 2015-11-02 DIAGNOSIS — R600 Localized edema: Secondary | ICD-10-CM | POA: Diagnosis not present

## 2015-11-02 DIAGNOSIS — M5416 Radiculopathy, lumbar region: Secondary | ICD-10-CM | POA: Diagnosis not present

## 2015-11-02 DIAGNOSIS — M4806 Spinal stenosis, lumbar region: Secondary | ICD-10-CM | POA: Diagnosis not present

## 2015-11-11 ENCOUNTER — Other Ambulatory Visit: Payer: Self-pay | Admitting: Family Medicine

## 2015-11-17 ENCOUNTER — Ambulatory Visit (INDEPENDENT_AMBULATORY_CARE_PROVIDER_SITE_OTHER): Payer: Medicare Other | Admitting: Family Medicine

## 2015-11-17 ENCOUNTER — Encounter: Payer: Self-pay | Admitting: Family Medicine

## 2015-11-17 VITALS — BP 112/68 | HR 50 | Temp 98.7°F | Resp 18 | Ht 65.0 in

## 2015-11-17 DIAGNOSIS — E7800 Pure hypercholesterolemia, unspecified: Secondary | ICD-10-CM | POA: Diagnosis not present

## 2015-11-17 DIAGNOSIS — Z23 Encounter for immunization: Secondary | ICD-10-CM | POA: Diagnosis not present

## 2015-11-17 DIAGNOSIS — M797 Fibromyalgia: Secondary | ICD-10-CM | POA: Diagnosis not present

## 2015-11-17 DIAGNOSIS — F5101 Primary insomnia: Secondary | ICD-10-CM

## 2015-11-17 DIAGNOSIS — G834 Cauda equina syndrome: Secondary | ICD-10-CM

## 2015-11-17 DIAGNOSIS — M47812 Spondylosis without myelopathy or radiculopathy, cervical region: Secondary | ICD-10-CM | POA: Diagnosis not present

## 2015-11-17 DIAGNOSIS — M47817 Spondylosis without myelopathy or radiculopathy, lumbosacral region: Secondary | ICD-10-CM

## 2015-11-17 DIAGNOSIS — R4789 Other speech disturbances: Secondary | ICD-10-CM

## 2015-11-17 DIAGNOSIS — R2681 Unsteadiness on feet: Secondary | ICD-10-CM

## 2015-11-17 DIAGNOSIS — I10 Essential (primary) hypertension: Secondary | ICD-10-CM | POA: Diagnosis not present

## 2015-11-17 LAB — LIPID PANEL
CHOL/HDL RATIO: 2.8 ratio (ref <=5.0)
CHOLESTEROL: 132 mg/dL (ref 125–200)
HDL: 48 mg/dL (ref >=46)
LDL Cholesterol: 62 mg/dL (ref <130)
TRIGLYCERIDES: 110 mg/dL (ref <150)
VLDL: 22 mg/dL (ref <30)

## 2015-11-17 LAB — COMPREHENSIVE METABOLIC PANEL
ALBUMIN: 4 g/dL (ref 3.6–5.1)
ALK PHOS: 84 U/L (ref 33–130)
ALT: 37 U/L — AB (ref 6–29)
AST: 32 U/L (ref 10–35)
BILIRUBIN TOTAL: 0.9 mg/dL (ref 0.2–1.2)
BUN: 16 mg/dL (ref 7–25)
CALCIUM: 9.3 mg/dL (ref 8.6–10.4)
CO2: 26 mmol/L (ref 20–31)
Chloride: 107 mmol/L (ref 98–110)
Creat: 0.82 mg/dL (ref 0.60–0.93)
Glucose, Bld: 93 mg/dL (ref 65–99)
POTASSIUM: 4.6 mmol/L (ref 3.5–5.3)
Sodium: 141 mmol/L (ref 135–146)
Total Protein: 6.2 g/dL (ref 6.1–8.1)

## 2015-11-17 LAB — CBC WITH DIFFERENTIAL/PLATELET
BASOS PCT: 1 %
Basophils Absolute: 19 cells/uL (ref 0–200)
EOS ABS: 57 {cells}/uL (ref 15–500)
Eosinophils Relative: 3 %
HEMATOCRIT: 45 % (ref 35.0–45.0)
HEMOGLOBIN: 14.8 g/dL (ref 11.7–15.5)
LYMPHS ABS: 1197 {cells}/uL (ref 850–3900)
LYMPHS PCT: 63 %
MCH: 32.2 pg (ref 27.0–33.0)
MCHC: 32.9 g/dL (ref 32.0–36.0)
MCV: 97.8 fL (ref 80.0–100.0)
MONO ABS: 57 {cells}/uL — AB (ref 200–950)
MPV: 9.9 fL (ref 7.5–12.5)
Monocytes Relative: 3 %
NEUTROS PCT: 30 %
Neutro Abs: 570 cells/uL — ABNORMAL LOW (ref 1500–7800)
Platelets: 158 10*3/uL (ref 140–400)
RBC: 4.6 MIL/uL (ref 3.80–5.10)
RDW: 14 % (ref 11.0–15.0)
WBC: 1.9 10*3/uL — AB (ref 3.8–10.8)

## 2015-11-17 MED ORDER — ZOLPIDEM TARTRATE 5 MG PO TABS: ORAL_TABLET | ORAL | 5 refills | Status: DC

## 2015-11-17 MED ORDER — PREGABALIN 75 MG PO CAPS: 75.0000 mg | ORAL_CAPSULE | Freq: Every day | ORAL | 1 refills | Status: DC

## 2015-11-17 MED ORDER — FOLIC ACID 1 MG PO TABS: 1.0000 mg | ORAL_TABLET | Freq: Every day | ORAL | 3 refills | Status: DC

## 2015-11-17 NOTE — Progress Notes (Signed)
Patient ID: Kaylee Weiss, female   DOB: 15-Jun-1945, 70 y.o.   MRN: [id number]   By signing my name below I, Kaylee Weiss, attest that this documentation has been prepared under the direction and in the presence of Wardell Honour MD. Electonically Signed. Kaylee Weiss, Scribe 11/17/2015 at 10:47 AM  Subjective:    Patient ID: [name], female    DOB: August 19, 1945, 70 y.o.   MRN: [id number]  11/17/2015  Annual Exam   HPI Kaylee Weiss is a 69 y.o. female who presents to the [facility] for follow up reevaluation of her cholesterol and hypertension.   Pt also c/o pain in her posterior left knee. Pt had a venogram done at [facility] in order to rule out a blood clot. Venogram was normal. Pt states that she is having difficulty "getting up on the left knee". Pt walks daily and states that her knee starts hurting after a mile of walking. Pt reports left lower leg swelling and denies left knee swelling. Pt reports full ROM in her left knee.   Pt also reports that her chronic low back pain is worsening and she thinks that she is developing left sided sciatica.  Pt reports that she was started on lyrica this year by a rheumatologist. Pt states that the lyrica has been working well and has not been having any side effects. Pt takes 75mg  lyrica at night and 25 mg lyrica in the morning. Pt's rheumatologist believes her left lower leg swelling is secondary to her lyrica.  Pt reports that her entire life she has had word finding difficulties when she feels tired. Pt reports that her word finding difficulty has been worsening this year. Pt states that her neurologist has moved and that she needs a new neurologist referral.   Pt reports a painless, ulcerated, white spot that appeared on her rt thumb 2 weeks ago. No trauma; no redness, drainage, or pustule.  Pt denies any CP, palpitations, SOB, changes in her bladder or urinary symptoms.  Pt was put on folic acid as preventative treatment by  her cardiologist.  Pt has history of chronic low back pain, HLD, HTN, and anxiety. Pt denies checking her BP at home.  Pt had a physical exam on 03/13/15.  Pt states that her life has been great.   Pt states she wants to get her flu shot later in the season.   Wt Readings from Last 3 Encounters:  12/16/15 143 lb (64.9 kg)  04/17/15 147 lb (66.7 kg)  02/17/15 145 lb (65.8 kg)   BP Readings from Last 3 Encounters:  12/16/15 118/78  11/17/15 112/68  08/10/15 120/72     Review of Systems  Constitutional: Negative for chills, diaphoresis, fatigue, fever and unexpected weight change.  Eyes: Negative for visual disturbance.  Respiratory: Negative for apnea, cough, choking, chest tightness, shortness of breath, wheezing and stridor.   Cardiovascular: Positive for leg swelling (mild, left ankle). Negative for chest pain and palpitations.  Gastrointestinal: Negative for abdominal distention, abdominal pain, anal bleeding, blood in stool, constipation, diarrhea, nausea, rectal pain and vomiting.  Endocrine: Negative for cold intolerance, heat intolerance, polydipsia, polyphagia and polyuria.  Musculoskeletal: Positive for arthralgias (left knee pain) and back pain (chronic low back pain, worsening).  Skin: Positive for color change (small white spon on rt thumb).  Neurological: Positive for speech difficulty (chronic, worsening). Negative for dizziness, tremors, seizures, syncope, facial asymmetry, weakness, light-headedness, numbness and headaches.  Psychiatric/Behavioral: Negative for agitation.  Past Medical History:  Diagnosis Date  . Anxiety   . Arthritis   . Cauda equina syndrome ([place])   . Cervical spinal stenosis   . Fibromyalgia   . Herpes labialis   . Hyperlipidemia   . Hypertension   . Lumbar spinal stenosis   . Macular degeneration, right eye   . Migraine   . PE (pulmonary thromboembolism) ()   . Pulmonary embolism ([place])    age 20; not on OCP.  Coumadin x 2 years.   . Retinal vein occlusion    age 18.  Marland Kitchen Spinal stenosis of lumbar region 12/16/2015   L3-4 level   Past Surgical History:  Procedure Laterality Date  . ABDOMINAL SURGERY    . HAND SURGERY Left   . ROTATOR CUFF REPAIR Right    Allergies  Allergen Reactions  . Demerol [Meperidine] Anaphylaxis  . Gabapentin Other (See Comments)  . Codeine     Other reaction(s): Other (See Comments)  . Other Other (See Comments)    Suicidal ideation Alcohol (drinking kind); takes liver enzymes and elevates them  . Percocet [Oxycodone-Acetaminophen] Nausea And Vomiting   Current Outpatient Prescriptions  Medication Sig Dispense Refill  . aspirin 81 MG tablet Take 81 mg by mouth daily.    Marland Kitchen atorvastatin (LIPITOR) 10 MG tablet take 1 tablet by mouth once daily 90 tablet 3  . buPROPion (WELLBUTRIN XL) 150 MG 24 hr tablet take 1 tablet by mouth once daily 90 tablet 3  . celecoxib (CELEBREX) 200 MG capsule Take 1 capsule (200 mg total) by mouth 2 (two) times daily. 180 capsule 3  . Eflornithine HCl 13.9 % cream Apply 1 application topically 2 (two) times daily with a meal. 45 g 3  . folic acid (FOLVITE) 1 MG tablet Take 1 tablet (1 mg total) by mouth daily. 90 tablet 3  . lidocaine (LIDODERM) 5 % Place 1-3 patches onto the skin daily. Remove & Discard patch within 12 hours or as directed by MD 30 patch 5  . losartan (COZAAR) 50 MG tablet take 1 tablet by mouth once daily .NEED TO MAKE OFFICE VISIT FOR MORE REFILLS 90 tablet 0  . metoprolol (TOPROL-XL) 200 MG 24 hr tablet take 1 tablet by mouth once daily 90 tablet 3  . OVER THE COUNTER MEDICATION OTC B6 100 mgm taking daily    . OVER THE COUNTER MEDICATION OTC preservision areds taking 1 in the am/qhs    . OVER THE COUNTER MEDICATION OTC B12-SL taking everyday    . OVER THE COUNTER MEDICATION Reported on 04/17/2015    . tolterodine (DETROL LA) 2 MG 24 hr capsule Take 1 capsule (2 mg total) by mouth daily. 90 capsule 3  . valACYclovir (VALTREX) 500 MG  tablet take 1 tablet by mouth twice a day 180 tablet 0  . zolpidem (AMBIEN) 5 MG tablet take 1 tablet by mouth at bedtime if needed for sleep (Patient taking differently: 2.5 mg. take 1 tablet by mouth at bedtime if needed for sleep) 30 tablet 5  . acyclovir ointment (ZOVIRAX) 5 % Apply topically.    . pregabalin (LYRICA) 25 MG capsule Take by mouth.    . pregabalin (LYRICA) 75 MG capsule Take 1 capsule (75 mg total) by mouth at bedtime. 90 capsule 1  . UNABLE TO FIND Uses Lidocaine topical cream @@ night     No current facility-administered medications for this visit.    Social History   Social History  . Marital status: Married  Spouse name: Herbie Baltimore  . Number of children: 2  . Years of education: RN   Occupational History  . 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 Smoking status: Never Smoker  . Smokeless tobacco: Never Used  . Alcohol use 0.0 oz/week     Comment: 15 cc/wk  . Drug use: No  . Sexual activity: No     Comment: Married   Other Topics Concern  . Not on file   Social History Narrative   Marital status:  Married x 45 years      Children: 2 children (48, 84); no granchildren      Lives: with husband, german shepard      Employed: works once per week; has a Patent attorney one day per week; previous therapy practice.        Tobacco: never      Alcohol:  Socially; one glass of wine per week      Exercise:  Daily exercise; 3.0 miles per day      Advanced Directives:  FULL CODE; no prolonged measures      ADLs: independent with ADLs.        Right-handed   Caffeine: 2 cups of coffee per day         Family History  Problem Relation Age of Onset  . Heart attack Mother   . Stroke Mother     CVA age 19; repeat at age 38 as cause of death.  . Diabetes Mother   . Heart disease Mother 64    recurrent AMIs  . Hyperlipidemia Mother   . Hypertension Mother   . Diabetes Father   . Heart attack Father   . Cancer  Father 49    lung cancer  . Hyperlipidemia Father   . Hypertension Father   . Asthma Son        Objective:    BP 112/68 (BP Location: Right Arm, Patient Position: Sitting, Cuff Size: Small)   Pulse (!) 50   Temp 98.7 F (37.1 C) (Oral)   Resp 18   Ht 5\' 5"  (1.651 m)   SpO2 98%  Physical Exam  Constitutional: She is oriented to person, place, and time. She appears well-developed and well-nourished. No distress.  HENT:  Head: Normocephalic and atraumatic.  Right Ear: External ear normal.  Left Ear: External ear normal.  Nose: Nose normal.  Mouth/Throat: Oropharynx is clear and moist.  Eyes: Conjunctivae and EOM are normal. Pupils are equal, round, and reactive to light.  Neck: Normal range of motion. Neck supple. Carotid bruit is not present. No thyromegaly present.  Cardiovascular: Normal rate, regular rhythm, normal heart sounds and intact distal pulses.  Exam reveals no gallop and no friction rub.   No murmur heard. Pulmonary/Chest: Effort normal and breath sounds normal. She has no wheezes. She has no rales.  Abdominal: Soft. Bowel sounds are normal. She exhibits no distension and no mass. There is no tenderness. There is no rebound and no guarding.  Musculoskeletal:       Left knee: She exhibits normal range of motion, no swelling, no effusion and no deformity. No tenderness found.       Left ankle: She exhibits swelling (mild, secondary to lyrica).       Lumbar back: She exhibits normal range of motion, no tenderness, no bony tenderness, no swelling and no deformity.  Lymphadenopathy:    She has no cervical adenopathy.  Neurological: She is alert and oriented to person, place, and time. No cranial nerve deficit.  Skin: Skin is warm and dry. No rash noted. She is not diaphoretic. No erythema. No pallor.  Psychiatric: She has a normal mood and affect. Her behavior is normal.        Assessment & Plan:   1. Essential hypertension   2. Pure hypercholesterolemia   3.  Cauda equina syndrome ([facility])   4. Cervical spondylosis without myelopathy   5. Fibromyalgia   6. Lumbar and sacral osteoarthritis   7. Unstable gait   8. Word finding difficulty    -controlled hypertension and hypercholesterolemia; obtain labs; continue current medications. -tolerating Lyrica better than Neurontin; swelling in leg may be due to Lyrica yet is mild at this time; continue to monitor. -s/p Pneumovax -refer to neurology due to word finding difficulties. -followed closely by [facility] for cauda equina syndrome.   -refill of Ambien provided; pt aware of high risk nature of medication. -hamstring exercises provided for posterior knee pain; recommend follow-up with Dr. Carlota Raspberry of Sports Medicine if posterior knee pain or hip pain persists; pt agreeable.  Orders Placed This Encounter  Procedures  . Pneumococcal polysaccharide vaccine 23-valent greater than or equal to 2yo subcutaneous/IM  . CBC with Differential/Platelet  . Comprehensive metabolic panel    Order Specific Question:   Has the patient fasted?    Answer:   Yes  . Lipid panel    Order Specific Question:   Has the patient fasted?    Answer:   Yes  . Ambulatory referral to Neurology    Referral Priority:   Routine    Referral Type:   Consultation    Referral Reason:   Specialty Services Required    Requested Specialty:   Neurology    Number of Visits Requested:   1   Meds ordered this encounter  Medications  . DISCONTD: pregabalin (LYRICA) 75 MG capsule    Sig: Take 75 mg by mouth 2 (two) times daily.  . pregabalin (LYRICA) 75 MG capsule    Sig: Take 1 capsule (75 mg total) by mouth at bedtime.    Dispense:  90 capsule    Refill:  1  . folic acid (FOLVITE) 1 MG tablet    Sig: Take 1 tablet (1 mg total) by mouth daily.    Dispense:  90 tablet    Refill:  3  . zolpidem (AMBIEN) 5 MG tablet    Sig: take 1 tablet by mouth at bedtime if needed for sleep    Dispense:  30 tablet    Refill:  5    Return in about 6  months (around 05/16/2016) for complete physical examiniation.   I personally performed the services described in this documentation, which was scribed in my presence. The recorded information has been reviewed and considered.  Nhi Butrum Elayne Guerin, M.D. [facility] [address] [place], [place]  [address] [phone] phone [phone] fax

## 2015-11-17 NOTE — Patient Instructions (Addendum)
IF you received an x-ray today, you will receive an invoice from [facility]. Please contact [facility] at [phone] with questions or concerns regarding your invoice.   IF you received labwork today, you will receive an invoice from [employer]. Please contact [facility] at [phone] with questions or concerns regarding your invoice.   Our billing staff will not be able to assist you with questions regarding bills from these companies.  You will be contacted with the lab results as soon as they are available. The fastest way to get your results is to activate your My Chart account. Instructions are located on the last page of this paperwork. If you have not heard from [place] regarding the results in 2 weeks, please contact this office.     Hamstring Strain With Rehab The hamstring muscle and tendons are vulnerable to muscle or tendon tear (strain). Hamstring tears cause pain and inflammation in the backside of the thigh, where the hamstring muscles are located. The hamstring is comprised of three muscles that are responsible for straightening the hip, bending the knee, and stabilizing the knee. These muscles are important for walking, running, and jumping. Hamstring strain is the most common injury of the thigh. Hamstring strains are classified as grade 1 or 2 strains. Grade 1 strains cause pain, but the tendon is not lengthened. Grade 2 strains include a lengthened ligament due to the ligament being stretched or partially ruptured. With grade 2 strains there is still function, although the function may be decreased.  SYMPTOMS   Pain, tenderness, swelling, warmth, or redness over the hamstring muscles, at the back of the thigh.  Pain that gets worse during and after intense activity.  A "pop" heard in the area, at the time of injury.  Muscle spasm in the hamstring muscles.  Pain or weakness with running, jumping, or bending the knee against  resistance.  Crackling sound (crepitation) when the tendon is moved or touched.  Bruising (contusion) in the thigh within 48 hours of injury.  Loss of fullness of the muscle, or area of muscle bulging in the case of a complete rupture. CAUSES  A muscle strain occurs when a force is placed on the muscle or tendon that is greater than it can withstand. Common causes of injury include:  Strain from overuse or sudden increase in the frequency, duration, or intensity of activity.  Single violent blow or force to the back of the knee or the hamstring area of the thigh. RISK INCREASES WITH:  Sports that require quick starts (sprinting, racquetball, tennis).  Sports that require jumping (basketball, volleyball).  Kicking sports and water skiing.  Contact sports (soccer, football).  Poor strength and flexibility.  Failure to warm up properly before activity.  Previous thigh, knee, or pelvis injury.  Poor exercise technique.  Poor posture. PREVENTION  Maintain physical fitness:  Strength, flexibility, and endurance.  Cardiovascular fitness.  Learn and use proper exercise technique and posture.  Wear proper fitted and padded protective equipment. PROGNOSIS  If treated properly, hamstring strains are usually curable in 2 to 6 weeks. RELATED COMPLICATIONS   Longer healing time, if not properly treated or if not given adequate time to heal.  Chronically inflamed tendon, causing persistent pain with activity that may progress to constant pain.  Recurring symptoms, if activity is resumed too soon.  Vulnerable to repeated injury (in up to 25% of cases). TREATMENT  Treatment first involves the use of ice and medication to help reduce  pain and inflammation. It is also important to complete strengthening and stretching exercises, as well as modifying any activities that aggravate the symptoms. These exercises may be completed at home or with a therapist. Your caregiver may  recommend the use of crutches to help reduce pain and discomfort, especially is the strain is severe enough to cause limping. If the tendon has pulled away from the bone, then surgery is necessary to reattach it. MEDICATION   If pain medicine is needed, nonsteroidal anti-inflammatory medicines (aspirin and ibuprofen), or other minor pain relievers (acetaminophen), are often advised.  Do not take pain medicine for 7 days before surgery.  Prescription pain relievers may be given if your caregiver thinks they are needed. Use only as directed and only as much as you need.  Corticosteroid injections may be recommended. However, these injections should only be used for serious cases, as they can only be given a certain number of times.  Ointments applied to the skin may be beneficial. HEAT AND COLD  Cold treatment (icing) relieves pain and reduces inflammation. Cold treatment should be applied for 10 to 15 minutes every 2 to 3 hours, and immediately after activity that aggravates your symptoms. Use ice packs or an ice massage.  Heat treatment may be used before performing the stretching and strengthening activities prescribed by your caregiver, physical therapist, or athletic trainer. Use a heat pack or a warm water soak. SEEK MEDICAL CARE IF:   Symptoms get worse or do not improve in 2 weeks, despite treatment.  New, unexplained symptoms develop. (Drugs used in treatment may produce side effects.) EXERCISES RANGE OF MOTION (ROM) AND STRETCHING EXERCISES - Hamstring Strain These exercises may help you when beginning to rehabilitate your injury. Your symptoms may go away with or without further involvement from your physician, physical therapist or athletic trainer. While completing these exercises, remember:   Restoring tissue flexibility helps normal motion to return to the joints. This allows healthier, less painful movement and activity.  An effective stretch should be held for at least 30  seconds.  A stretch should never be painful. You should only feel a gentle lengthening or release in the stretched tissue. STRETCH - Hamstrings, Standing  Stand or sit, and extend your right / left leg, placing your foot on a chair or foot stool.  Keep a slight arch in your low back and your hips straight forward.  Lead with your chest, and lean forward at the waist until you feel a gentle stretch in the back of your right / left knee or thigh. (When done correctly, this exercise requires leaning only a small distance.)  Hold this position for __________ seconds. Repeat __________ times. Complete this stretch __________ times per day. STRETCH - Hamstrings, Supine   Lie on your back. Loop a belt or towel over the ball of your right / left foot.  Straighten your right / left knee and slowly pull on the belt to raise your leg. Do not allow the right / left knee to bend. Keep your opposite leg flat on the floor.  Raise the leg until you feel a gentle stretch behind your right / left knee or thigh. Hold this position for __________ seconds. Repeat __________ times. Complete this stretch __________ times per day.  STRETCH - Hamstrings, Doorway  Lie on your back with your right / left leg extended and resting on the wall, and the opposite leg flat on the ground through the door. Initially, position your bottom farther away from  the wall.  Keep your right / left knee straight. If you feel a stretch behind your knee or thigh, hold this position for __________ seconds.  If you do not feel a stretch, scoot your bottom closer to the door and hold __________ seconds. Repeat __________ times. Complete this stretch __________ times per day.  STRETCH - Hamstrings/Adductors, V-Sit   Sit on the floor with your legs extended in a large "V," keeping your knees straight.  With your head and chest upright, bend at your waist reaching for your left foot to stretch your right thigh muscles.  You should  feel a stretch in your right inner thigh. Hold for __________ seconds.  Return to the upright position to relax your leg muscles.  Continuing to keep your chest upright, bend straight forward at your waist to stretch your hamstrings.  You should feel a stretch behind both of your thighs and knees. Hold for __________ seconds.  Return to the upright position to relax your leg muscles.  With your head and chest upright, bend at your waist reaching for your right foot to stretch your left thigh muscles.  You should feel a stretch in your left inner thigh. Hold for __________ seconds.  Return to the upright position to relax your leg muscles. Repeat __________ times. Complete this exercise __________ times per day.  STRENGTHENING EXERCISES - Hamstring Strain These exercises may help you when beginning to rehabilitate your injury. They may resolve your symptoms with or without further involvement from your physician, physical therapist or athletic trainer. While completing these exercises, remember:   Muscles can gain both the endurance and the strength needed for everyday activities through controlled exercises.  Complete these exercises as instructed by your physician, physical therapist or athletic trainer. Increase the resistance and repetitions only as guided.  You may experience muscle soreness or fatigue, but the pain or discomfort you are trying to eliminate should never get worse during these exercises. If this pain does get worse, stop and make certain you are following the directions exactly. If the pain is still present after adjustments, discontinue the exercise until you can discuss the trouble with your clinician. STRENGTH - Hip Extensors, Straight Leg Raises   Lie on your stomach on a firm surface.  Tense the muscles in your buttocks to lift your right / left leg about 4 inches. If you cannot lift your leg this high without arching your back, place a pillow under your  hips.  Keep your knee straight. Hold for __________ seconds.  Slowly lower your leg to the starting position and allow it to relax completely before starting the next repetition. Repeat __________ times. Complete this exercise __________ times per day.  STRENGTH - Hamstring, Isometrics   Lie on your back on a firm surface.  Bend your right / left knee approximately __________ degrees.  Dig your heel into the surface, as if you are trying to pull it toward your buttocks. Tighten the muscles in the back of your thighs to "dig" as hard as you can, without increasing any pain.  Hold this position for __________ seconds.  Release the tension gradually and allow your muscles to completely relax for __________ seconds between each exercise. Repeat __________ times. Complete this exercise __________ times per day.  STRENGTH - Hamstring, Curls   Lay on your stomach with your legs extended. (If you lay on a bed, your feet may hang over the edge.)  Tighten the muscles in the back of your thigh to  bend your right / left knee up to 90 degrees. Keep your hips flat on the bed or floor.  Hold this position for __________ seconds.  Slowly lower your leg back to the starting position. Repeat __________ times. Complete this exercise __________ times per day.  OPTIONAL ANKLE WEIGHTS: Begin with ____________________, but DO NOT exceed ____________________. Increase in 1 pound/0.5 kilogram increments.   This information is not intended to replace advice given to you by your health care provider. Make sure you discuss any questions you have with your health care provider.   Document Released: 02/21/2005 Document Revised: 03/14/2014 Document Reviewed: 06/05/2008 Elsevier Interactive Patient Education [employer].

## 2015-11-23 ENCOUNTER — Encounter: Payer: Self-pay | Admitting: Family Medicine

## 2015-11-23 DIAGNOSIS — D72819 Decreased white blood cell count, unspecified: Secondary | ICD-10-CM

## 2015-11-29 ENCOUNTER — Encounter: Payer: Self-pay | Admitting: Family Medicine

## 2015-11-30 NOTE — Telephone Encounter (Signed)
Duplicate

## 2015-12-07 ENCOUNTER — Ambulatory Visit (INDEPENDENT_AMBULATORY_CARE_PROVIDER_SITE_OTHER): Payer: Medicare Other

## 2015-12-07 DIAGNOSIS — E538 Deficiency of other specified B group vitamins: Secondary | ICD-10-CM

## 2015-12-07 DIAGNOSIS — Z23 Encounter for immunization: Secondary | ICD-10-CM

## 2015-12-07 DIAGNOSIS — D72819 Decreased white blood cell count, unspecified: Secondary | ICD-10-CM

## 2015-12-07 LAB — CBC WITH DIFFERENTIAL/PLATELET
BASOS ABS: 0 {cells}/uL (ref 0–200)
BASOS PCT: 0 %
EOS ABS: 234 {cells}/uL (ref 15–500)
EOS PCT: 3 %
HCT: 41.7 % (ref 35.0–45.0)
HEMOGLOBIN: 13.8 g/dL (ref 11.7–15.5)
LYMPHS ABS: 3120 {cells}/uL (ref 850–3900)
Lymphocytes Relative: 40 %
MCH: 31.7 pg (ref 27.0–33.0)
MCHC: 33.1 g/dL (ref 32.0–36.0)
MCV: 95.6 fL (ref 80.0–100.0)
MONOS PCT: 8 %
MPV: 10.4 fL (ref 7.5–12.5)
Monocytes Absolute: 624 cells/uL (ref 200–950)
NEUTROS ABS: 3822 {cells}/uL (ref 1500–7800)
Neutrophils Relative %: 49 %
PLATELETS: 195 10*3/uL (ref 140–400)
RBC: 4.36 MIL/uL (ref 3.80–5.10)
RDW: 13.5 % (ref 11.0–15.0)
WBC: 7.8 10*3/uL (ref 3.8–10.8)

## 2015-12-11 ENCOUNTER — Ambulatory Visit
Admission: RE | Admit: 2015-12-11 | Discharge: 2015-12-11 | Disposition: A | Discharge status: Home / Self Care | Payer: Medicare Other | Source: Ambulatory Visit | Attending: Family Medicine | Admitting: Family Medicine

## 2015-12-11 DIAGNOSIS — Z1231 Encounter for screening mammogram for malignant neoplasm of breast: Secondary | ICD-10-CM

## 2015-12-16 ENCOUNTER — Encounter: Payer: Self-pay | Admitting: Neurology

## 2015-12-16 ENCOUNTER — Ambulatory Visit (INDEPENDENT_AMBULATORY_CARE_PROVIDER_SITE_OTHER): Payer: Medicare Other | Admitting: Neurology

## 2015-12-16 VITALS — BP 118/78 | HR 53 | Ht 65.0 in | Wt 143.0 lb

## 2015-12-16 DIAGNOSIS — M48061 Spinal stenosis, lumbar region without neurogenic claudication: Secondary | ICD-10-CM | POA: Insufficient documentation

## 2015-12-16 DIAGNOSIS — R413 Other amnesia: Secondary | ICD-10-CM | POA: Insufficient documentation

## 2015-12-16 DIAGNOSIS — E538 Deficiency of other specified B group vitamins: Secondary | ICD-10-CM | POA: Diagnosis not present

## 2015-12-16 HISTORY — DX: Spinal stenosis, lumbar region without neurogenic claudication: M48.061

## 2015-12-16 NOTE — Progress Notes (Signed)
Reason for visit: Memory disturbance  Referring physician: Dr. Willette [name] is a 70 y.o. female  History of present illness:  Kaylee Weiss is a 70 year old right-handed white female with a history of lumbosacral spinal stenosis associated with chronic back pain, and down bladder incontinence. The patient has been felt to have a cauda equina syndrome. The patient also indicates that she has fibromyalgia, she has discomfort from the waist level down. She denies any significant issues with gait instability, she has not had any falls. She does have cervical spondylosis as well but she denies any neck discomfort or pain down the legs. The patient denies neurogenic claudication, she is able to walk 3-5 miles a day. She does have chronic insomnia. She reports that she has a lifelong history of problems with word finding, she believes that her cognitive capacities have changed or worsened over the last 9 months. She does not correlate the memory issues with any new medications. She was started on Lyrica, but this was started after the cognitive changes had already begun. The patient reports some mild short-term memory issues, but she mainly has problems with cognitive slowing and word finding. The patient denies any new numbness or weakness of the face, arms, or legs. She takes Ambien at night for sleep. She is followed through orthopedic surgery at [facility] for her low back problems, she will get epidural steroid injections and injections into the hip for bursitis. She reports a lot of right knee discomfort. She is sent to this office for further evaluation.  Past Medical History:  Diagnosis Date  . Anxiety   . Arthritis   . Cauda equina syndrome ([place])   . Cervical spinal stenosis   . Fibromyalgia   . Herpes labialis   . Hyperlipidemia   . Hypertension   . Lumbar spinal stenosis   . Macular degeneration, right eye   . Migraine   . PE (pulmonary thromboembolism) ([place])   . Pulmonary  embolism ([place])    age 61; not on OCP.  Coumadin x 2 years.  . Retinal vein occlusion    age 25.  Marland Kitchen Spinal stenosis of lumbar region 12/16/2015   L3-4 level    Past Surgical History:  Procedure Laterality Date  . ABDOMINAL SURGERY    . HAND SURGERY Left   . ROTATOR CUFF REPAIR Right     Family History  Problem Relation Age of Onset  . Heart attack Mother   . Stroke Mother     CVA age 61; repeat at age 67 as cause of death.  . Diabetes Mother   . Heart disease Mother 41    recurrent AMIs  . Hyperlipidemia Mother   . Hypertension Mother   . Diabetes Father   . Heart attack Father   . Cancer Father 33    lung cancer  . Hyperlipidemia Father   . Hypertension Father   . Asthma Son     Social history:  reports that she has never smoked. She has never used smokeless tobacco. She reports that she drinks alcohol. She reports that she does not use drugs.  Medications:  Prior to Admission medications   Medication Sig Start Date End Date Taking? Authorizing Provider  acyclovir ointment (ZOVIRAX) 5 % Apply topically.    Historical Provider, MD  aspirin 81 MG tablet Take 81 mg by mouth daily.    Historical Provider, MD  atorvastatin (LIPITOR) 10 MG tablet take 1 tablet by mouth once daily 09/14/15  Wardell Honour, MD  buPROPion (WELLBUTRIN XL) 150 MG 24 hr tablet take 1 tablet by mouth once daily 09/14/15   Wardell Honour, MD  celecoxib (CELEBREX) 200 MG capsule Take 1 capsule (200 mg total) by mouth 2 (two) times daily. 07/31/14   Robyn Haber, MD  Eflornithine HCl 13.9 % cream Apply 1 application topically 2 (two) times daily with a meal. 07/15/15   Wardell Honour, MD  folic acid (FOLVITE) 1 MG tablet Take 1 tablet (1 mg total) by mouth daily. 11/17/15   Wardell Honour, MD  lidocaine (LIDODERM) 5 % Place 1-3 patches onto the skin daily. Remove & Discard patch within 12 hours or as directed by MD 08/10/15   Wardell Honour, MD  losartan (COZAAR) 50 MG tablet take 1 tablet by mouth once  daily .NEED TO MAKE OFFICE VISIT FOR MORE REFILLS 11/16/15   Wardell Honour, MD  metoprolol (TOPROL-XL) 200 MG 24 hr tablet take 1 tablet by mouth once daily 09/14/15   Wardell Honour, MD  OVER THE COUNTER MEDICATION OTC B6 100 mgm taking daily    Historical Provider, MD  OVER THE COUNTER MEDICATION OTC preservision areds taking 1 in the am/qhs    Historical Provider, MD  OVER THE COUNTER MEDICATION OTC B12-SL taking everyday    Historical Provider, MD  OVER THE COUNTER MEDICATION Reported on 04/17/2015    Historical Provider, MD  pregabalin (LYRICA) 25 MG capsule Take by mouth.    Historical Provider, MD  pregabalin (LYRICA) 75 MG capsule Take 1 capsule (75 mg total) by mouth at bedtime. 11/17/15   Wardell Honour, MD  tolterodine (DETROL LA) 2 MG 24 hr capsule Take 1 capsule (2 mg total) by mouth daily. 02/17/15   Wardell Honour, MD  UNABLE TO FIND Uses Lidocaine topical cream @@ night    Historical Provider, MD  valACYclovir (VALTREX) 500 MG tablet take 1 tablet by mouth twice a day 08/08/15   Wardell Honour, MD  zolpidem (AMBIEN) 5 MG tablet take 1 tablet by mouth at bedtime if needed for sleep Patient taking differently: 2.5 mg. take 1 tablet by mouth at bedtime if needed for sleep 11/17/15   Wardell Honour, MD      Allergies  Allergen Reactions  . Demerol [Meperidine] Anaphylaxis  . Gabapentin Other (See Comments)  . Codeine     Other reaction(s): Other (See Comments)  . Other Other (See Comments)    Suicidal ideation Alcohol (drinking kind); takes liver enzymes and elevates them  . Percocet [Oxycodone-Acetaminophen] Nausea And Vomiting    ROS:  Out of a complete 14 system review of symptoms, the patient complains only of the following symptoms, and all other reviewed systems are negative.  Incontinence of bowel and bladder Insomnia  Blood pressure 118/78, pulse (!) 53, height 5\' 5"  (1.651 m), weight 143 lb (64.9 kg).  Physical Exam  General: The patient is alert and cooperative  at the time of the examination.  Eyes: Pupils are equal, round, and reactive to light. Discs are flat bilaterally.  Neck: The neck is supple, no carotid bruits are noted.  Respiratory: The respiratory examination is clear.  Cardiovascular: The cardiovascular examination reveals a regular rate and rhythm, no obvious murmurs or rubs are noted.   Neuromuscular: Range of movement of the low back is full.  Skin: Extremities are without significant edema.  Neurologic Exam  Mental status: The patient is alert and oriented x 3 at the  time of the examination. The patient has apparent normal recent and remote memory, with an apparently normal attention span and concentration ability. Mini-Mental Status Examination done today shows a total score 25/30.  Cranial nerves: Facial symmetry is present. There is good sensation of the face to pinprick and soft touch bilaterally. The strength of the facial muscles and the muscles to head turning and shoulder shrug are normal bilaterally. Speech is well enunciated, no aphasia or dysarthria is noted. Extraocular movements are full. Visual fields are full. The tongue is midline, and the patient has symmetric elevation of the soft palate. No obvious hearing deficits are noted.  Motor: The motor testing reveals 5 over 5 strength of all 4 extremities. Good symmetric motor tone is noted throughout.  Sensory: Sensory testing is intact to pinprick, soft touch, vibration sensation, and position sense on all 4 extremities, with exception of some decrease in pinprick sensation on the right thigh and lateral aspect of the lower leg. No evidence of extinction is noted.  Coordination: Cerebellar testing reveals good finger-nose-finger and heel-to-shin bilaterally.  Gait and station: Gait is normal. Tandem gait is normal. Romberg is negative. No drift is seen. The patient is able to walk on heels and the toes.  Reflexes: Deep tendon reflexes are symmetric and normal  bilaterally, with exception of some depression of ankle jerk reflexes bilaterally. Toes are downgoing bilaterally.   Assessment/Plan:  1. Lumbosacral spinal stenosis, L3-4 level  2. Bowel and bladder incontinence  3. Reported history of fibromyalgia  4. Reported history of memory disorder, word finding problems    The patient has a relatively unremarkable clinical examination today. She reports a change in memory over time, I have recommended a neuropsychological evaluation, she does not wish to pursue this. We will check blood work today, MRI evaluation of the brain. She will follow-up in 6 months, we will follow the memory issues over time. The low back pain is being followed through [facility]. The patient claims that her low back problems are too complex and that surgery would be so extensive and complicated that she may not recover from it.   Jill Alexanders MD 12/16/2015 9:43 AM  [facility] [address] [place], [place] [address]  Phone [phone] Fax [phone]

## 2015-12-17 LAB — VITAMIN B12: Vitamin B-12: 730 pg/mL (ref 211–946)

## 2015-12-17 LAB — SEDIMENTATION RATE: Sed Rate: 2 mm/hr (ref 0–40)

## 2015-12-17 LAB — RPR: RPR Ser Ql: NONREACTIVE

## 2015-12-17 LAB — HIV ANTIBODY (ROUTINE TESTING W REFLEX): HIV Screen 4th Generation wRfx: NONREACTIVE

## 2015-12-25 MED ORDER — LOSARTAN POTASSIUM 50 MG PO TABS: ORAL_TABLET | ORAL | 3 refills | Status: DC

## 2015-12-29 ENCOUNTER — Ambulatory Visit
Admission: RE | Admit: 2015-12-29 | Discharge: 2015-12-29 | Disposition: A | Discharge status: Home / Self Care | Payer: Medicare Other | Source: Ambulatory Visit | Attending: Neurology | Admitting: Neurology

## 2015-12-29 DIAGNOSIS — R413 Other amnesia: Secondary | ICD-10-CM | POA: Diagnosis not present

## 2015-12-30 ENCOUNTER — Telehealth: Payer: Self-pay | Admitting: Neurology

## 2015-12-30 NOTE — Telephone Encounter (Signed)
I called patient. MRI study of the brain was normal.   MRI brain 12/30/15:  IMPRESSION: Unremarkable MRI scan of the brain without contrast.

## 2016-02-08 ENCOUNTER — Other Ambulatory Visit: Payer: Self-pay | Admitting: Family Medicine

## 2016-02-08 NOTE — Telephone Encounter (Signed)
Last ov and labs 11/2015

## 2016-02-09 DIAGNOSIS — M5416 Radiculopathy, lumbar region: Secondary | ICD-10-CM | POA: Diagnosis not present

## 2016-02-09 DIAGNOSIS — M48061 Spinal stenosis, lumbar region without neurogenic claudication: Secondary | ICD-10-CM | POA: Diagnosis not present

## 2016-02-10 ENCOUNTER — Telehealth: Payer: Self-pay | Admitting: Emergency Medicine

## 2016-02-10 ENCOUNTER — Telehealth: Payer: Self-pay

## 2016-02-10 NOTE — Telephone Encounter (Signed)
Patient called asking if she can drop urine off for possible UTI. Pt was last seen by Dr. Tamala Julian 11/2015. Advised to schedule office visit. Patient asked that I send message to Dr. Tamala Julian to make that decision due to their previous conversation of incontinence issues.  Please advise

## 2016-02-10 NOTE — Telephone Encounter (Signed)
Patient is calling because [facility] wants patient to have a urine specimen for a possible UTI. Patient wants to know if Dr. Tamala Julian can out in a lab order. Please advise!  [phone]

## 2016-02-12 NOTE — Telephone Encounter (Signed)
Please schedule pt for office visit for possible UTI.

## 2016-02-13 ENCOUNTER — Telehealth: Payer: Self-pay | Admitting: Emergency Medicine

## 2016-02-13 NOTE — Telephone Encounter (Signed)
Left message advising patient to schedule office visit per Dr. Tamala Julian

## 2016-02-17 ENCOUNTER — Ambulatory Visit (INDEPENDENT_AMBULATORY_CARE_PROVIDER_SITE_OTHER): Payer: Medicare Other | Admitting: Family Medicine

## 2016-02-17 ENCOUNTER — Encounter: Payer: Self-pay | Admitting: Family Medicine

## 2016-02-17 VITALS — BP 104/82 | HR 57 | Temp 97.3°F | Resp 16 | Ht 65.0 in

## 2016-02-17 DIAGNOSIS — M48061 Spinal stenosis, lumbar region without neurogenic claudication: Secondary | ICD-10-CM | POA: Diagnosis not present

## 2016-02-17 DIAGNOSIS — R350 Frequency of micturition: Secondary | ICD-10-CM | POA: Diagnosis not present

## 2016-02-17 LAB — POCT URINALYSIS DIP (MANUAL ENTRY)
Bilirubin, UA: NEGATIVE
Blood, UA: NEGATIVE
Glucose, UA: NEGATIVE
Ketones, POC UA: NEGATIVE
Nitrite, UA: NEGATIVE
Protein Ur, POC: NEGATIVE
Spec Grav, UA: 1.02
Urobilinogen, UA: 0.2
pH, UA: 5

## 2016-02-17 MED ORDER — CEPHALEXIN 500 MG PO CAPS: 500.0000 mg | ORAL_CAPSULE | Freq: Three times a day (TID) | ORAL | 0 refills | Status: DC

## 2016-02-17 NOTE — Patient Instructions (Addendum)
1.  Check blood pressure regularly.   Urinary Tract Infection, Adult Introduction A urinary tract infection (UTI) is an infection of any part of the urinary tract. The urinary tract includes the:  Kidneys.  Ureters.  Bladder.  Urethra. These organs make, store, and get rid of pee (urine) in the body. Follow these instructions at home:  Take over-the-counter and prescription medicines only as told by your doctor.  If you were prescribed an antibiotic medicine, take it as told by your doctor. Do not stop taking the antibiotic even if you start to feel better.  Avoid the following drinks:  Alcohol.  Caffeine.  Tea.  Carbonated drinks.  Drink enough fluid to keep your pee clear or pale yellow.  Keep all follow-up visits as told by your doctor. This is important.  Make sure to:  Empty your bladder often and completely. Do not to hold pee for long periods of time.  Empty your bladder before and after sex.  Wipe from front to back after a bowel movement if you are female. Use each tissue one time when you wipe. Contact a doctor if:  You have back pain.  You have a fever.  You feel sick to your stomach (nauseous).  You throw up (vomit).  Your symptoms do not get better after 3 days.  Your symptoms go away and then come back. Get help right away if:  You have very bad back pain.  You have very bad lower belly (abdominal) pain.  You are throwing up and cannot keep down any medicines or water. This information is not intended to replace advice given to you by your health care provider. Make sure you discuss any questions you have with your health care provider. Document Released: 08/10/2007 Document Revised: 07/30/2015 Document Reviewed: 01/12/2015  2017 Elsevier     IF you received an x-ray today, you will receive an invoice from [facility]. Please contact [facility] at [phone] with questions or concerns regarding your invoice.    IF you received labwork today, you will receive an invoice from [employer]. Please contact [facility] at [phone] with questions or concerns regarding your invoice.   Our billing staff will not be able to assist you with questions regarding bills from these companies.  You will be contacted with the lab results as soon as they are available. The fastest way to get your results is to activate your My Chart account. Instructions are located on the last page of this paperwork. If you have not heard from [place] regarding the results in 2 weeks, please contact this office.

## 2016-02-17 NOTE — Progress Notes (Signed)
Subjective:    Patient ID: Kaylee Weiss, female    DOB: April 27, 1945, 70 y.o.   MRN: [id number]  02/17/2016  Urinary Tract Infection (back pain, urine odor, hard to go sometimes, for 3 weeks )   HPI This 70 y.o. female presents for evaluation of urinary symptoms.   Foul odor to urine.  Usually occurs with diarrhea;  Recent fecal incontinence which is chronic for patient. Denies fever/chills/sweats.  Denies n/v; denies flank pain.  +urgency.  No worsening nocturia; no hematuria.  Started on Lyrica 100mg  qhs; pain is so much better; so happy.  25mg  every morning Lyrica.  So much better; not afraid of going to bed.  Laying down is horrible.    Has lost five pounds; started nutrisystem; happy with weight loss.    Immunization History  Administered Date(s) Administered  . Influenza,inj,Quad PF,36+ Mos 11/07/2014, 12/07/2015  . Influenza-Unspecified 12/05/2013  . Pneumococcal Conjugate-13 03/07/2014  . Pneumococcal Polysaccharide-23 11/17/2015  . Pneumococcal-Unspecified 03/07/2010  . Td 02/12/2014  . Zoster 03/07/2005   BP Readings from Last 3 Encounters:  02/17/16 104/82  12/16/15 118/78  11/17/15 112/68   Wt Readings from Last 3 Encounters:  12/16/15 143 lb (64.9 kg)  04/17/15 147 lb (66.7 kg)  02/17/15 145 lb (65.8 kg)    Review of Systems  Constitutional: Negative for chills, diaphoresis, fatigue and fever.  Eyes: Negative for visual disturbance.  Respiratory: Negative for cough and shortness of breath.   Cardiovascular: Negative for chest pain, palpitations and leg swelling.  Gastrointestinal: Positive for diarrhea. Negative for abdominal pain, constipation, nausea and vomiting.  Endocrine: Negative for cold intolerance, heat intolerance, polydipsia, polyphagia and polyuria.  Genitourinary: Positive for decreased urine volume, difficulty urinating, frequency and urgency. Negative for dysuria, flank pain, genital sores, hematuria, pelvic pain, vaginal discharge and vaginal  pain.  Musculoskeletal: Positive for back pain.  Neurological: Negative for dizziness, tremors, seizures, syncope, facial asymmetry, speech difficulty, weakness, light-headedness, numbness and headaches.    Past Medical History:  Diagnosis Date  . Anxiety   . Arthritis   . Cauda equina syndrome ([place])   . Cervical spinal stenosis   . Fibromyalgia   . Herpes labialis   . Hyperlipidemia   . Hypertension   . Lumbar spinal stenosis   . Macular degeneration, right eye   . Migraine   . PE (pulmonary thromboembolism) ([place])   . Pulmonary embolism ([place])    age 89; not on OCP.  Coumadin x 2 years.  . Retinal vein occlusion    age 43.  Marland Kitchen Spinal stenosis of lumbar region 12/16/2015   L3-4 level   Past Surgical History:  Procedure Laterality Date  . ABDOMINAL SURGERY    . HAND SURGERY Left   . ROTATOR CUFF REPAIR Right    Allergies  Allergen Reactions  . Demerol [Meperidine] Anaphylaxis  . Gabapentin Other (See Comments)  . Codeine     Other reaction(s): Other (See Comments)  . Other Other (See Comments)    Suicidal ideation Alcohol (drinking kind); takes liver enzymes and elevates them  . Percocet [Oxycodone-Acetaminophen] Nausea And Vomiting    Social History   Social History  . Marital status: Married    Spouse name: Herbie Baltimore  . Number of children: 2  . Years of education: RN   Occupational History  . 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 Smoking status: Never Smoker  . Smokeless tobacco: Never Used  .  Alcohol use 0.0 oz/week     Comment: 15 cc/wk  . Drug use: No  . Sexual activity: No     Comment: Married   Other Topics Concern  . Not on file   Social History Narrative   Marital status:  Married x 45 years      Children: 2 children (48, 106); no granchildren      Lives: with husband, german shepard      Employed: works once per week; has a Patent attorney one day per week; previous therapy practice.         Tobacco: never      Alcohol:  Socially; one glass of wine per week      Exercise:  Daily exercise; 3.0 miles per day      Advanced Directives:  FULL CODE; no prolonged measures      ADLs: independent with ADLs.        Right-handed   Caffeine: 2 cups of coffee per day         Family History  Problem Relation Age of Onset  . Heart attack Mother   . Stroke Mother     CVA age 58; repeat at age 3 as cause of death.  . Diabetes Mother   . Heart disease Mother 37    recurrent AMIs  . Hyperlipidemia Mother   . Hypertension Mother   . Diabetes Father   . Heart attack Father   . Cancer Father 22    lung cancer  . Hyperlipidemia Father   . Hypertension Father   . Asthma Son        Objective:    BP 104/82 (BP Location: Left Arm, Patient Position: Sitting, Cuff Size: Small)   Pulse (!) 57   Temp 97.3 F (36.3 C) (Oral)   Resp 16   Ht 5\' 5"  (1.651 m)   SpO2 98%  Physical Exam  Constitutional: She is oriented to person, place, and time. She appears well-developed and well-nourished. No distress.  HENT:  Head: Normocephalic and atraumatic.  Right Ear: External ear normal.  Left Ear: External ear normal.  Nose: Nose normal.  Mouth/Throat: Oropharynx is clear and moist.  Eyes: Conjunctivae and EOM are normal. Pupils are equal, round, and reactive to light.  Neck: Normal range of motion. Neck supple. Carotid bruit is not present. No thyromegaly present.  Cardiovascular: Normal rate, regular rhythm, normal heart sounds and intact distal pulses.  Exam reveals no gallop and no friction rub.   No murmur heard. Pulmonary/Chest: Effort normal and breath sounds normal. She has no wheezes. She has no rales.  Abdominal: Soft. Bowel sounds are normal. She exhibits no distension and no mass. There is no tenderness. There is no rebound, no guarding and no CVA tenderness.  Lymphadenopathy:    She has no cervical adenopathy.  Neurological: She is alert and oriented to person, place, and time.  No cranial nerve deficit.  Skin: Skin is warm and dry. No rash noted. She is not diaphoretic. No erythema. No pallor.  Psychiatric: She has a normal mood and affect. Her behavior is normal.        Assessment & Plan:   1. Urinary frequency   2. Spinal stenosis of lumbar region without neurogenic claudication     Orders Placed This Encounter  Procedures  . Urine culture  . Urine Microscopic  . POCT urinalysis dipstick   Meds ordered this encounter  Medications  . cephALEXin (KEFLEX) 500 MG capsule    Sig:  Take 1 capsule (500 mg total) by mouth 3 (three) times daily.    Dispense:  21 capsule    Refill:  0    No Follow-up on file.   Lousie Calico Elayne Guerin, M.D. [facility] [address] [place], [place]  [address] [phone] phone [phone] fax

## 2016-02-19 LAB — URINE CULTURE

## 2016-02-19 LAB — URINALYSIS, MICROSCOPIC ONLY: Casts: NONE SEEN /lpf

## 2016-02-19 NOTE — Telephone Encounter (Signed)
Appointment set for 03-22-16 with Dr. Tamala Julian.

## 2016-02-23 DIAGNOSIS — L821 Other seborrheic keratosis: Secondary | ICD-10-CM | POA: Diagnosis not present

## 2016-02-23 DIAGNOSIS — L570 Actinic keratosis: Secondary | ICD-10-CM | POA: Diagnosis not present

## 2016-02-23 DIAGNOSIS — D485 Neoplasm of uncertain behavior of skin: Secondary | ICD-10-CM | POA: Diagnosis not present

## 2016-02-23 DIAGNOSIS — L578 Other skin changes due to chronic exposure to nonionizing radiation: Secondary | ICD-10-CM | POA: Diagnosis not present

## 2016-02-23 DIAGNOSIS — B009 Herpesviral infection, unspecified: Secondary | ICD-10-CM | POA: Diagnosis not present

## 2016-02-23 DIAGNOSIS — L814 Other melanin hyperpigmentation: Secondary | ICD-10-CM | POA: Diagnosis not present

## 2016-02-23 DIAGNOSIS — L720 Epidermal cyst: Secondary | ICD-10-CM | POA: Diagnosis not present

## 2016-03-07 ENCOUNTER — Other Ambulatory Visit: Payer: Self-pay | Admitting: Family Medicine

## 2016-03-07 DIAGNOSIS — N390 Urinary tract infection, site not specified: Secondary | ICD-10-CM

## 2016-03-18 DIAGNOSIS — M48061 Spinal stenosis, lumbar region without neurogenic claudication: Secondary | ICD-10-CM | POA: Diagnosis not present

## 2016-03-18 DIAGNOSIS — M5416 Radiculopathy, lumbar region: Secondary | ICD-10-CM | POA: Diagnosis not present

## 2016-03-22 ENCOUNTER — Ambulatory Visit (INDEPENDENT_AMBULATORY_CARE_PROVIDER_SITE_OTHER): Payer: Medicare Other | Admitting: Family Medicine

## 2016-03-22 ENCOUNTER — Encounter: Payer: Self-pay | Admitting: Family Medicine

## 2016-03-22 ENCOUNTER — Telehealth: Payer: Self-pay

## 2016-03-22 VITALS — BP 125/73 | HR 48 | Temp 98.6°F | Resp 16 | Ht 65.0 in

## 2016-03-22 DIAGNOSIS — E7800 Pure hypercholesterolemia, unspecified: Secondary | ICD-10-CM

## 2016-03-22 DIAGNOSIS — G834 Cauda equina syndrome: Secondary | ICD-10-CM | POA: Diagnosis not present

## 2016-03-22 DIAGNOSIS — R21 Rash and other nonspecific skin eruption: Secondary | ICD-10-CM

## 2016-03-22 DIAGNOSIS — L280 Lichen simplex chronicus: Secondary | ICD-10-CM

## 2016-03-22 DIAGNOSIS — Z0000 Encounter for general adult medical examination without abnormal findings: Secondary | ICD-10-CM

## 2016-03-22 DIAGNOSIS — N3941 Urge incontinence: Secondary | ICD-10-CM | POA: Diagnosis not present

## 2016-03-22 DIAGNOSIS — I2510 Atherosclerotic heart disease of native coronary artery without angina pectoris: Secondary | ICD-10-CM

## 2016-03-22 DIAGNOSIS — M797 Fibromyalgia: Secondary | ICD-10-CM

## 2016-03-22 DIAGNOSIS — L821 Other seborrheic keratosis: Secondary | ICD-10-CM

## 2016-03-22 DIAGNOSIS — N3289 Other specified disorders of bladder: Secondary | ICD-10-CM | POA: Diagnosis not present

## 2016-03-22 DIAGNOSIS — R2681 Unsteadiness on feet: Secondary | ICD-10-CM

## 2016-03-22 DIAGNOSIS — M76892 Other specified enthesopathies of left lower limb, excluding foot: Secondary | ICD-10-CM

## 2016-03-22 DIAGNOSIS — M48061 Spinal stenosis, lumbar region without neurogenic claudication: Secondary | ICD-10-CM | POA: Diagnosis not present

## 2016-03-22 DIAGNOSIS — I10 Essential (primary) hypertension: Secondary | ICD-10-CM | POA: Diagnosis not present

## 2016-03-22 DIAGNOSIS — F5101 Primary insomnia: Secondary | ICD-10-CM

## 2016-03-22 DIAGNOSIS — M47812 Spondylosis without myelopathy or radiculopathy, cervical region: Secondary | ICD-10-CM

## 2016-03-22 DIAGNOSIS — L570 Actinic keratosis: Secondary | ICD-10-CM | POA: Diagnosis not present

## 2016-03-22 LAB — POCT URINALYSIS DIP (MANUAL ENTRY)
Bilirubin, UA: NEGATIVE
Blood, UA: NEGATIVE
GLUCOSE UA: NEGATIVE
Ketones, POC UA: NEGATIVE
NITRITE UA: NEGATIVE
PROTEIN UA: NEGATIVE
Spec Grav, UA: 1.025
UROBILINOGEN UA: 0.2
pH, UA: 5.5

## 2016-03-22 MED ORDER — VALACYCLOVIR HCL 500 MG PO TABS: 500.0000 mg | ORAL_TABLET | Freq: Two times a day (BID) | ORAL | 3 refills | Status: DC

## 2016-03-22 MED ORDER — BUPROPION HCL ER (XL) 150 MG PO TB24: 150.0000 mg | ORAL_TABLET | Freq: Every day | ORAL | 3 refills | Status: DC

## 2016-03-22 MED ORDER — PREGABALIN 100 MG PO CAPS: 100.0000 mg | ORAL_CAPSULE | Freq: Every day | ORAL | 1 refills | Status: DC

## 2016-03-22 MED ORDER — PREGABALIN 50 MG PO CAPS: 50.0000 mg | ORAL_CAPSULE | Freq: Every day | ORAL | 1 refills | Status: DC

## 2016-03-22 MED ORDER — FOLIC ACID 1 MG PO TABS: 1.0000 mg | ORAL_TABLET | Freq: Every day | ORAL | 3 refills | Status: DC

## 2016-03-22 MED ORDER — TOLTERODINE TARTRATE ER 2 MG PO CP24: 2.0000 mg | ORAL_CAPSULE | Freq: Every day | ORAL | 3 refills | Status: DC

## 2016-03-22 MED ORDER — ZOLPIDEM TARTRATE 5 MG PO TABS: ORAL_TABLET | ORAL | 5 refills | Status: DC

## 2016-03-22 MED ORDER — KETOCONAZOLE 2 % EX CREA: 1.0000 "application " | TOPICAL_CREAM | Freq: Two times a day (BID) | CUTANEOUS | 0 refills | Status: DC

## 2016-03-22 MED ORDER — METOPROLOL SUCCINATE ER 200 MG PO TB24: 200.0000 mg | ORAL_TABLET | Freq: Every day | ORAL | 3 refills | Status: DC

## 2016-03-22 MED ORDER — ATORVASTATIN CALCIUM 10 MG PO TABS: 10.0000 mg | ORAL_TABLET | Freq: Every day | ORAL | 3 refills | Status: DC

## 2016-03-22 MED ORDER — EFLORNITHINE HCL 13.9 % EX CREA: 1.0000 "application " | TOPICAL_CREAM | Freq: Two times a day (BID) | CUTANEOUS | 3 refills | Status: DC

## 2016-03-22 MED ORDER — CELECOXIB 200 MG PO CAPS: 200.0000 mg | ORAL_CAPSULE | Freq: Two times a day (BID) | ORAL | 3 refills | Status: DC

## 2016-03-22 MED ORDER — LOSARTAN POTASSIUM 25 MG PO TABS: 25.0000 mg | ORAL_TABLET | Freq: Every day | ORAL | 3 refills | Status: DC

## 2016-03-22 NOTE — Telephone Encounter (Signed)
vaniqa not covered, pharm requests alternative but could not name one for [place].  Also mentions provide clear instructions (last rx said with meal)

## 2016-03-22 NOTE — Patient Instructions (Addendum)
   IF you received an x-ray today, you will receive an invoice from [facility]. Please contact [facility] at [phone] with questions or concerns regarding your invoice.   IF you received labwork today, you will receive an invoice from [facility]. Please contact [facility] at [phone] with questions or concerns regarding your invoice.   Our billing staff will not be able to assist you with questions regarding bills from these companies.  You will be contacted with the lab results as soon as they are available. The fastest way to get your results is to activate your My Chart account. Instructions are located on the last page of this paperwork. If you have not heard from us regarding the results in 2 weeks, please contact this office.     Keeping You Healthy  Get These Tests  Blood Pressure- Have your blood pressure checked by your healthcare provider at least once a year.  Normal blood pressure is 120/80.  Weight- Have your body mass index (BMI) calculated to screen for obesity.  BMI is a measure of body fat based on height and weight.  You can calculate your own BMI at www.nhlbisupport.com/bmi/  Cholesterol- Have your cholesterol checked every year.  Diabetes- Have your blood sugar checked every year if you have high blood pressure, high cholesterol, a family history of diabetes or if you are overweight.  Pap Test - Have a pap test every 1 to 5 years if you have been sexually active.  If you are older than 65 and recent pap tests have been normal you may not need additional pap tests.  In addition, if you have had a hysterectomy  for benign disease additional pap tests are not necessary.  Mammogram-Yearly mammograms are essential for early detection of breast cancer  Screening for Colon Cancer- Colonoscopy starting at age 50. Screening may begin sooner depending on your family history and other health conditions.  Follow up colonoscopy as directed by your  Gastroenterologist.  Screening for Osteoporosis- Screening begins at age 65 with bone density scanning, sooner if you are at higher risk for developing Osteoporosis.  Get these medicines  Calcium with Vitamin D- Your body requires 1200-1500 mg of Calcium a day and 800-1000 IU of Vitamin D a day.  You can only absorb 500 mg of Calcium at a time therefore Calcium must be taken in 2 or 3 separate doses throughout the day.  Hormones- Hormone therapy has been associated with increased risk for certain cancers and heart disease.  Talk to your healthcare provider about if you need relief from menopausal symptoms.  Aspirin- Ask your healthcare provider about taking Aspirin to prevent Heart Disease and Stroke.  Get these Immuniztions  Flu shot- Every fall  Pneumonia shot- Once after the age of 65; if you are younger ask your healthcare provider if you need a pneumonia shot.  Tetanus- Every ten years.  Zostavax- Once after the age of 60 to prevent shingles.  Take these steps  Don't smoke- Your healthcare provider can help you quit. For tips on how to quit, ask your healthcare provider or go to www.smokefree.gov or call 1-800 QUIT-NOW.  Be physically active- Exercise 5 days a week for a minimum of 30 minutes.  If you are not already physically active, start slow and gradually work up to 30 minutes of moderate physical activity.  Try walking, dancing, bike riding, swimming, etc.  Eat a healthy diet- Eat a variety of healthy foods such as fruits, vegetables, whole grains, low   fat milk, low fat cheeses, yogurt, lean meats, chicken, fish, eggs, dried beans, tofu, etc.  For more information go to www.thenutritionsource.org  Dental visit- Brush and floss teeth twice daily; visit your dentist twice a year.  Eye exam- Visit your Optometrist or Ophthalmologist yearly.  Drink alcohol in moderation- Limit alcohol intake to one drink or less a day.  Never drink and drive.  Depression- Your emotional  health is as important as your physical health.  If you're feeling down or losing interest in things you normally enjoy, please talk to your healthcare provider.  Seat Belts- can save your life; always wear one  Smoke/Carbon Monoxide detectors- These detectors need to be installed on the appropriate level of your home.  Replace batteries at least once a year.  Violence- If anyone is threatening or hurting you, please tell your healthcare provider.  Living Will/ Health care power of attorney- Discuss with your healthcare provider and family.  

## 2016-03-22 NOTE — Progress Notes (Signed)
Subjective:    Patient ID: Kaylee Weiss, female    DOB: 25-Jan-1946, 71 y.o.   MRN: [id number]  03/22/2016  Annual Exam   HPI This 71 y.o. female presents for Annual Wellness Examination and chronic medical follow-up.   Last physical:  03-13-2015 Pap smear: n/a Mammogram: 12-11-2015 Colonoscopy:  2006; refuses repeat; agreeable to stool cards. Bone density:  04-10-2015; refuses again. Eye exam:  Glasses; scheduled every 6 months. Dental exam: every six months.    Immunization History  Administered Date(s) Administered  . Influenza,inj,Quad PF,36+ Mos 11/07/2014, 12/07/2015  . Influenza-Unspecified 12/05/2013  . Pneumococcal Conjugate-13 03/07/2014  . Pneumococcal Polysaccharide-23 11/17/2015  . Pneumococcal-Unspecified 03/07/2010  . Td 02/12/2014  . Zoster 03/07/2005   BP Readings from Last 3 Encounters:  03/22/16 125/73  02/17/16 104/82  12/16/15 118/78   Wt Readings from Last 3 Encounters:  12/16/15 143 lb (64.9 kg)  04/17/15 147 lb (66.7 kg)  02/17/15 145 lb (65.8 kg)   HTN: Patient reports good compliance with medication, good tolerance to medication, and good symptom control.  Home Bp runs 104-125/68-75.  Hypercholesterolemia: Patient reports good compliance with medication, good tolerance to medication, and good symptom control.    Anxiety and depression: Patient reports good compliance with medication, good tolerance to medication, and good symptom control.    Chronic lower back pain: s/p injection last week; participating in study for chronic back pain.  +non-healing wound R anterior neck; history of basal cell carcinoma   Review of Systems  Constitutional: Negative for activity change, appetite change, chills, diaphoresis, fatigue, fever and unexpected weight change.  HENT: Negative for congestion, dental problem, drooling, ear discharge, ear pain, facial swelling, hearing loss, mouth sores, nosebleeds, postnasal drip, rhinorrhea, sinus pressure, sneezing, sore  throat, tinnitus, trouble swallowing and voice change.   Eyes: Negative for photophobia, pain, discharge, redness, itching and visual disturbance.  Respiratory: Negative for apnea, cough, choking, chest tightness, shortness of breath, wheezing and stridor.   Cardiovascular: Negative for chest pain, palpitations and leg swelling.  Gastrointestinal: Negative for abdominal distention, abdominal pain, anal bleeding, blood in stool, constipation, diarrhea, nausea, rectal pain and vomiting.  Endocrine: Negative for cold intolerance, heat intolerance, polydipsia, polyphagia and polyuria.  Genitourinary: Positive for difficulty urinating. Negative for decreased urine volume, dyspareunia, dysuria, enuresis, flank pain, frequency, genital sores, hematuria, menstrual problem, pelvic pain, urgency, vaginal bleeding, vaginal discharge and vaginal pain.  Musculoskeletal: Positive for back pain and myalgias. Negative for arthralgias, gait problem, joint swelling, neck pain and neck stiffness.  Skin: Negative for color change, pallor, rash and wound.  Allergic/Immunologic: Negative for environmental allergies, food allergies and immunocompromised state.  Neurological: Negative for dizziness, tremors, seizures, syncope, facial asymmetry, speech difficulty, weakness, light-headedness, numbness and headaches.  Hematological: Negative for adenopathy. Does not bruise/bleed easily.  Psychiatric/Behavioral: Negative for agitation, behavioral problems, confusion, decreased concentration, dysphoric mood, hallucinations, self-injury, sleep disturbance and suicidal ideas. The patient is not nervous/anxious and is not hyperactive.     Past Medical History:  Diagnosis Date  . Anxiety   . Arthritis   . Cauda equina syndrome ([place])   . Cervical spinal stenosis   . Fibromyalgia   . Herpes labialis   . Hyperlipidemia   . Hypertension   . Lumbar spinal stenosis   . Macular degeneration, right eye   . Migraine   . PE  (pulmonary thromboembolism) ([place])   . Pulmonary embolism ([place])    age 59; not on OCP.  Coumadin x 2 years.  . Retinal  vein occlusion    age 67.  Marland Kitchen Spinal stenosis of lumbar region 12/16/2015   L3-4 level   Past Surgical History:  Procedure Laterality Date  . ABDOMINAL SURGERY    . HAND SURGERY Left   . ROTATOR CUFF REPAIR Right    Allergies  Allergen Reactions  . Demerol [Meperidine] Anaphylaxis  . Gabapentin Other (See Comments)  . Codeine     Other reaction(s): Other (See Comments)  . Other Other (See Comments)    Suicidal ideation Alcohol (drinking kind); takes liver enzymes and elevates them  . Percocet [Oxycodone-Acetaminophen] Nausea And Vomiting    Social History   Social History  . Marital status: Married    Spouse name: Herbie Baltimore  . Number of children: 2  . Years of education: RN   Occupational History  . 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 Smoking status: Never Smoker  . Smokeless tobacco: Never Used  . Alcohol use 0.0 oz/week     Comment: 15 cc/wk  . Drug use: No  . Sexual activity: No     Comment: Married   Other Topics Concern  . Not on file   Social History Narrative   Marital status:  Married x 47 years      Children: 2 children (49, 71); no granchildren      Lives: with husband, german shepard, kitkat/kitten      Employed: works once per week; has a Patent attorney one day per week; previous therapy practice in 2018.        Tobacco: never      Alcohol:  Socially; one glass of wine per week      Exercise:  Daily exercise; 3.0 miles per day      Advanced Directives:  FULL CODE; no prolonged measures      ADLs: independent with ADLs.        Right-handed   Caffeine: 2 cups of coffee per day         Family History  Problem Relation Age of Onset  . Heart attack Mother   . Stroke Mother     CVA age 81; repeat at age 35 as cause of death.  . Diabetes Mother   . Heart disease Mother 104     recurrent AMIs  . Hyperlipidemia Mother   . Hypertension Mother   . Diabetes Father   . Heart attack Father   . Cancer Father 83    lung cancer  . Hyperlipidemia Father   . Hypertension Father   . Asthma Son        Objective:    BP 125/73 (BP Location: Right Arm, Patient Position: Sitting, Cuff Size: Small)   Pulse (!) 48   Temp 98.6 F (37 C) (Oral)   Resp 16   Ht 5\' 5"  (1.651 m)   SpO2 99%  Physical Exam  Constitutional: She is oriented to person, place, and time. She appears well-developed and well-nourished. No distress.  HENT:  Head: Normocephalic and atraumatic.  Right Ear: External ear normal.  Left Ear: External ear normal.  Nose: Nose normal.  Mouth/Throat: Oropharynx is clear and moist.  Eyes: Conjunctivae and EOM are normal. Pupils are equal, round, and reactive to light.  Neck: Normal range of motion and full passive range of motion without pain. Neck supple. No JVD present. Carotid bruit is not present. No thyromegaly present.  Cardiovascular: Normal rate, regular rhythm and normal  heart sounds.  Exam reveals no gallop and no friction rub.   No murmur heard. Pulmonary/Chest: Effort normal and breath sounds normal. She has no wheezes. She has no rales. Right breast exhibits no inverted nipple, no mass, no nipple discharge, no skin change and no tenderness. Left breast exhibits no inverted nipple, no mass, no nipple discharge, no skin change and no tenderness. Breasts are symmetrical.  Abdominal: Soft. Bowel sounds are normal. She exhibits no distension and no mass. There is no tenderness. There is no rebound and no guarding.  Musculoskeletal:       Right shoulder: Normal.       Left shoulder: Normal.       Cervical back: Normal.  Lymphadenopathy:    She has no cervical adenopathy.  Neurological: She is alert and oriented to person, place, and time. She has normal reflexes. No cranial nerve deficit. She exhibits normal muscle tone. Coordination normal.  Skin:  Skin is warm and dry. No rash noted. She is not diaphoretic. No erythema. No pallor.     5 mm healing skin lesion with mild erythema and scaling.  No pustule or vesicle associated with area.  Psychiatric: She has a normal mood and affect. Her behavior is normal. Judgment and thought content normal.  Nursing note and vitals reviewed.  Depression screen [facility] 2/9 03/22/2016 02/17/2016 11/17/2015 08/10/2015 07/15/2015  Decreased Interest 0 0 0 0 0  Down, Depressed, Hopeless 0 0 0 0 0  PHQ - 2 Score 0 0 0 0 0   Fall Risk  03/22/2016 02/17/2016 11/17/2015 08/10/2015 07/15/2015  Falls in the past year? No No No No No  Number falls in past yr: - - - - -  Injury with Fall? - - - - -  Risk Factor Category  - - - - -  Risk for fall due to : - - - - -  Risk for fall due to (comments): - - - - -   Functional Status Survey: Is the patient deaf or have difficulty hearing?: No Does the patient have difficulty seeing, even when wearing glasses/contacts?: No Does the patient have difficulty concentrating, remembering, or making decisions?: No Does the patient have difficulty walking or climbing stairs?: No Does the patient have difficulty dressing or bathing?: No Does the patient have difficulty doing errands alone such as visiting a doctor's office or shopping?: No      Assessment & Plan:   1. Encounter for Medicare annual wellness exam   2. Essential hypertension   3. Arteriosclerosis of coronary artery   4. Cauda equina syndrome ([place])   5. Actinic keratoses   6. Cervical spondylosis without myelopathy   7. Enthesopathy of left hip region   8. Lichen simplex   9. Basal cell papilloma   10. Urge incontinence of urine   11. Fibromyalgia   12. Primary insomnia   13. Spinal stenosis of lumbar region without neurogenic claudication   14. Irritable bladder   15. Unstable gait   16. Hypercholesteremia   17. Rash and nonspecific skin eruption    -anticipatory guidance provided. -decrease Losartan to  25mg  daily due to borderline low readings. -rx for ketoconazole cream to skin lesion; if no improvement in two weeks, will warrant derm consultations. -obtain labs for chronic disease management; refills provided.   Orders Placed This Encounter  Procedures  . CBC with Differential/Platelet  . Comprehensive metabolic panel    Order Specific Question:   Has the patient fasted?    Answer:  Yes  . Lipid panel    Order Specific Question:   Has the patient fasted?    Answer:   Yes  . Ambulatory referral to Dermatology    Referral Priority:   Routine    Referral Type:   Consultation    Referral Reason:   Specialty Services Required    Requested Specialty:   Dermatology    Number of Visits Requested:   1  . POCT urinalysis dipstick   Meds ordered this encounter  Medications  . DISCONTD: LYRICA 100 MG capsule  . DISCONTD: LYRICA 25 MG capsule  . Eflornithine HCl 13.9 % cream    Sig: Apply topically.  Marland Kitchen losartan (COZAAR) 25 MG tablet    Sig: Take 1 tablet (25 mg total) by mouth daily.    Dispense:  90 tablet    Refill:  3  . pregabalin (LYRICA) 50 MG capsule    Sig: Take 1 capsule (50 mg total) by mouth daily.    Dispense:  90 capsule    Refill:  1  . pregabalin (LYRICA) 100 MG capsule    Sig: Take 1 capsule (100 mg total) by mouth at bedtime.    Dispense:  90 capsule    Refill:  1  . zolpidem (AMBIEN) 5 MG tablet    Sig: take 1 tablet by mouth at bedtime if needed for sleep    Dispense:  30 tablet    Refill:  5  . metoprolol (TOPROL-XL) 200 MG 24 hr tablet    Sig: Take 1 tablet (200 mg total) by mouth daily.    Dispense:  90 tablet    Refill:  3  . tolterodine (DETROL LA) 2 MG 24 hr capsule    Sig: Take 1 capsule (2 mg total) by mouth daily.    Dispense:  90 capsule    Refill:  3  . valACYclovir (VALTREX) 500 MG tablet    Sig: Take 1 tablet (500 mg total) by mouth 2 (two) times daily.    Dispense:  180 tablet    Refill:  3  . folic acid (FOLVITE) 1 MG tablet    Sig:  Take 1 tablet (1 mg total) by mouth daily.    Dispense:  90 tablet    Refill:  3  . Eflornithine HCl 13.9 % cream    Sig: Apply 1 application topically 2 (two) times daily with a meal.    Dispense:  45 g    Refill:  3  . celecoxib (CELEBREX) 200 MG capsule    Sig: Take 1 capsule (200 mg total) by mouth 2 (two) times daily.    Dispense:  180 capsule    Refill:  3  . buPROPion (WELLBUTRIN XL) 150 MG 24 hr tablet    Sig: Take 1 tablet (150 mg total) by mouth daily.    Dispense:  90 tablet    Refill:  3  . atorvastatin (LIPITOR) 10 MG tablet    Sig: Take 1 tablet (10 mg total) by mouth daily.    Dispense:  90 tablet    Refill:  3  . ketoconazole (NIZORAL) 2 % cream    Sig: Apply 1 application topically 2 (two) times daily.    Dispense:  30 g    Refill:  0    Return in about 6 months (around 09/19/2016) for recheck.   Mairi Stagliano Elayne Guerin, M.D. [facility] [address] [place], [place]  [address] [phone] phone [phone])  [phone] fax

## 2016-03-22 NOTE — Telephone Encounter (Signed)
[facility] has called to get better instructions about medications giving today   [facility] [phone]

## 2016-03-23 LAB — COMPREHENSIVE METABOLIC PANEL
A/G RATIO: 1.7 (ref 1.2–2.2)
ALK PHOS: 96 IU/L (ref 39–117)
ALT: 29 IU/L (ref 0–32)
AST: 26 IU/L (ref 0–40)
Albumin: 4 g/dL (ref 3.5–4.8)
BILIRUBIN TOTAL: 0.6 mg/dL (ref 0.0–1.2)
BUN/Creatinine Ratio: 26 (ref 12–28)
BUN: 22 mg/dL (ref 8–27)
CHLORIDE: 105 mmol/L (ref 96–106)
CO2: 24 mmol/L (ref 18–29)
Calcium: 9.5 mg/dL (ref 8.7–10.3)
Creatinine, Ser: 0.85 mg/dL (ref 0.57–1.00)
GFR calc Af Amer: 80 mL/min/{1.73_m2} (ref >59)
GFR calc non Af Amer: 69 mL/min/{1.73_m2} (ref >59)
Globulin, Total: 2.3 g/dL (ref 1.5–4.5)
Glucose: 81 mg/dL (ref 65–99)
POTASSIUM: 4.9 mmol/L (ref 3.5–5.2)
SODIUM: 144 mmol/L (ref 134–144)
Total Protein: 6.3 g/dL (ref 6.0–8.5)

## 2016-03-23 LAB — CBC WITH DIFFERENTIAL/PLATELET
BASOS: 0 %
Basophils Absolute: 0 10*3/uL (ref 0.0–0.2)
EOS (ABSOLUTE): 0.2 10*3/uL (ref 0.0–0.4)
Eos: 3 %
Hematocrit: 43.3 % (ref 34.0–46.6)
Hemoglobin: 14.4 g/dL (ref 11.1–15.9)
IMMATURE GRANULOCYTES: 0 %
Immature Grans (Abs): 0 10*3/uL (ref 0.0–0.1)
LYMPHS ABS: 2.4 10*3/uL (ref 0.7–3.1)
Lymphs: 28 %
MCH: 30.9 pg (ref 26.6–33.0)
MCHC: 33.3 g/dL (ref 31.5–35.7)
MCV: 93 fL (ref 79–97)
MONOS ABS: 0.6 10*3/uL (ref 0.1–0.9)
Monocytes: 7 %
NEUTROS ABS: 5.1 10*3/uL (ref 1.4–7.0)
NEUTROS PCT: 62 %
PLATELETS: 208 10*3/uL (ref 150–379)
RBC: 4.66 x10E6/uL (ref 3.77–5.28)
RDW: 13.8 % (ref 12.3–15.4)
WBC: 8.3 10*3/uL (ref 3.4–10.8)

## 2016-03-23 LAB — LIPID PANEL
Chol/HDL Ratio: 2.8 ratio units (ref 0.0–4.4)
Cholesterol, Total: 150 mg/dL (ref 100–199)
HDL: 53 mg/dL (ref >39)
LDL Calculated: 73 mg/dL (ref 0–99)
TRIGLYCERIDES: 121 mg/dL (ref 0–149)
VLDL Cholesterol Cal: 24 mg/dL (ref 5–40)

## 2016-03-24 MED ORDER — EFLORNITHINE HCL 13.9 % EX CREA: 1.0000 "application " | TOPICAL_CREAM | Freq: Two times a day (BID) | CUTANEOUS | 3 refills | Status: DC

## 2016-03-24 NOTE — Telephone Encounter (Signed)
1. Please call pt and advise that insurance will not cover Vaniqa.  2. Revised rx and sent in again.  I have no clue why rx had with food; must have clicked on incorrect bid option.

## 2016-03-29 ENCOUNTER — Encounter: Payer: Self-pay | Admitting: Family Medicine

## 2016-03-29 DIAGNOSIS — L989 Disorder of the skin and subcutaneous tissue, unspecified: Secondary | ICD-10-CM

## 2016-03-30 NOTE — Telephone Encounter (Signed)
Pt calling to check on dermatology referral. States that spot has not changed at all/is unresponsive to treatment. I am waiting on OV notes to be closed before I can send out the referral. Patient is aware that I will send as soon as complete.

## 2016-03-31 MED ORDER — PREGABALIN 25 MG PO CAPS
25.0000 mg | ORAL_CAPSULE | Freq: Every day | ORAL | 5 refills | Status: DC
Start: 2016-03-31 — End: 2016-11-10

## 2016-04-04 ENCOUNTER — Telehealth: Payer: Self-pay

## 2016-04-04 NOTE — Telephone Encounter (Signed)
Dr Tamala Julian recommended that pt see Dr Ubaldo Glassing at [facility]. I have schedule the next avail apt for her with Dr Ubaldo Glassing, March 2nd at 4:10pm. Left a VM with patient with these details. Also stated that other providers at [facility] are able to schedule an earlier apt if she would like to schedule with them. Their number is [phone].

## 2016-04-05 DIAGNOSIS — M48061 Spinal stenosis, lumbar region without neurogenic claudication: Secondary | ICD-10-CM | POA: Diagnosis not present

## 2016-04-05 DIAGNOSIS — M5416 Radiculopathy, lumbar region: Secondary | ICD-10-CM | POA: Diagnosis not present

## 2016-04-08 ENCOUNTER — Telehealth: Payer: Self-pay

## 2016-04-08 NOTE — Telephone Encounter (Signed)
Zolpidem PA  Key [id number] covermymeds

## 2016-04-21 DIAGNOSIS — D485 Neoplasm of uncertain behavior of skin: Secondary | ICD-10-CM | POA: Diagnosis not present

## 2016-04-21 DIAGNOSIS — L280 Lichen simplex chronicus: Secondary | ICD-10-CM | POA: Diagnosis not present

## 2016-04-21 DIAGNOSIS — L439 Lichen planus, unspecified: Secondary | ICD-10-CM | POA: Diagnosis not present

## 2016-04-22 ENCOUNTER — Encounter: Payer: Self-pay | Admitting: Family Medicine

## 2016-04-22 ENCOUNTER — Telehealth: Payer: Self-pay

## 2016-04-22 DIAGNOSIS — G834 Cauda equina syndrome: Secondary | ICD-10-CM

## 2016-04-22 DIAGNOSIS — N390 Urinary tract infection, site not specified: Secondary | ICD-10-CM

## 2016-04-22 NOTE — Telephone Encounter (Signed)
I dont see any mention of urine collection on last urine 03/2016 or letter or note?

## 2016-04-22 NOTE — Telephone Encounter (Signed)
Patient stopped in for a urine test - she thought Dr. Tamala Julian had placed an order.  One could not be located.   Please call  [phone] (M)

## 2016-04-24 DIAGNOSIS — R350 Frequency of micturition: Secondary | ICD-10-CM | POA: Diagnosis not present

## 2016-04-25 NOTE — Telephone Encounter (Signed)
I placed a standing order for a urine and urine culture for patient a couple of months ago.  Can you see the standing order?  I just entered another standing order and EPIC advised me that I have a current standing order for Urine culture for patient.   If you locate this standing order, please call pt back for collection.

## 2016-04-26 NOTE — Telephone Encounter (Signed)
Communicated with patient via [place]. No further action warranted.

## 2016-04-26 NOTE — Telephone Encounter (Signed)
Pt . States she used another urgent care and was seen and treated.  She walked in Saturday and we were full for appointments She would have [facility]  But was offered no info on this and felt like her case/urine was debated by front staff overtly

## 2016-05-02 DIAGNOSIS — H524 Presbyopia: Secondary | ICD-10-CM | POA: Diagnosis not present

## 2016-05-02 DIAGNOSIS — H40013 Open angle with borderline findings, low risk, bilateral: Secondary | ICD-10-CM | POA: Diagnosis not present

## 2016-05-02 DIAGNOSIS — H353131 Nonexudative age-related macular degeneration, bilateral, early dry stage: Secondary | ICD-10-CM | POA: Diagnosis not present

## 2016-05-02 DIAGNOSIS — H2513 Age-related nuclear cataract, bilateral: Secondary | ICD-10-CM | POA: Diagnosis not present

## 2016-05-03 ENCOUNTER — Encounter: Payer: Self-pay | Admitting: Family Medicine

## 2016-05-07 ENCOUNTER — Other Ambulatory Visit (INDEPENDENT_AMBULATORY_CARE_PROVIDER_SITE_OTHER): Payer: Medicare Other

## 2016-05-07 DIAGNOSIS — G834 Cauda equina syndrome: Secondary | ICD-10-CM

## 2016-05-07 DIAGNOSIS — N390 Urinary tract infection, site not specified: Secondary | ICD-10-CM

## 2016-05-07 LAB — POCT URINALYSIS DIP (MANUAL ENTRY)
Bilirubin, UA: NEGATIVE
GLUCOSE UA: NEGATIVE
Ketones, POC UA: NEGATIVE
NITRITE UA: POSITIVE — AB
PROTEIN UA: NEGATIVE
RBC UA: NEGATIVE
Spec Grav, UA: 1.015
Urobilinogen, UA: 0.2
pH, UA: 5.5

## 2016-05-10 LAB — URINE CULTURE

## 2016-05-11 ENCOUNTER — Telehealth: Payer: Self-pay

## 2016-05-11 DIAGNOSIS — H353132 Nonexudative age-related macular degeneration, bilateral, intermediate dry stage: Secondary | ICD-10-CM | POA: Diagnosis not present

## 2016-05-11 DIAGNOSIS — H31092 Other chorioretinal scars, left eye: Secondary | ICD-10-CM | POA: Diagnosis not present

## 2016-05-11 DIAGNOSIS — H35371 Puckering of macula, right eye: Secondary | ICD-10-CM | POA: Diagnosis not present

## 2016-05-11 DIAGNOSIS — H43813 Vitreous degeneration, bilateral: Secondary | ICD-10-CM | POA: Diagnosis not present

## 2016-05-11 MED ORDER — AMOXICILLIN-POT CLAVULANATE 875-125 MG PO TABS: 1.0000 | ORAL_TABLET | Freq: Two times a day (BID) | ORAL | 0 refills | Status: DC

## 2016-05-11 NOTE — Telephone Encounter (Signed)
Noted.  I sent patient a Mychart message with urine culture results.

## 2016-05-11 NOTE — Telephone Encounter (Signed)
Dr. Tamala Julian, Patient's Ambien is approved 04/11/16-05/11/17. Approval code is [id number],  Patient has been notified.  She would like the results of her urine culture.  I looked at them but they don't appeared to have been reviewed yet.  If you could do this, I would be happy to call her back. Thanks, [employer]

## 2016-05-13 NOTE — Telephone Encounter (Signed)
Completed by Sharol Harness.

## 2016-05-17 ENCOUNTER — Encounter: Payer: Self-pay | Admitting: Neurology

## 2016-05-23 ENCOUNTER — Encounter: Payer: Self-pay | Admitting: Family Medicine

## 2016-05-28 ENCOUNTER — Other Ambulatory Visit (INDEPENDENT_AMBULATORY_CARE_PROVIDER_SITE_OTHER): Payer: Medicare Other

## 2016-05-28 DIAGNOSIS — R300 Dysuria: Secondary | ICD-10-CM | POA: Diagnosis not present

## 2016-05-28 LAB — POCT URINALYSIS DIP (MANUAL ENTRY)
Bilirubin, UA: NEGATIVE
GLUCOSE UA: NEGATIVE
Ketones, POC UA: NEGATIVE
Nitrite, UA: POSITIVE — AB
Protein Ur, POC: NEGATIVE
SPEC GRAV UA: 1.02 (ref 1.030–1.035)
UROBILINOGEN UA: 0.2 (ref ?–2.0)
pH, UA: 5 (ref 5.0–8.0)

## 2016-05-28 LAB — POC MICROSCOPIC URINALYSIS (UMFC): Mucus: ABSENT

## 2016-06-01 ENCOUNTER — Encounter: Payer: Self-pay | Admitting: Family Medicine

## 2016-06-01 ENCOUNTER — Ambulatory Visit (INDEPENDENT_AMBULATORY_CARE_PROVIDER_SITE_OTHER): Payer: Medicare Other | Admitting: Family Medicine

## 2016-06-01 VITALS — BP 115/74 | HR 56 | Temp 98.1°F | Resp 16 | Ht 65.0 in | Wt 144.0 lb

## 2016-06-01 DIAGNOSIS — G834 Cauda equina syndrome: Secondary | ICD-10-CM | POA: Diagnosis not present

## 2016-06-01 DIAGNOSIS — N3945 Continuous leakage: Secondary | ICD-10-CM | POA: Diagnosis not present

## 2016-06-01 DIAGNOSIS — I2510 Atherosclerotic heart disease of native coronary artery without angina pectoris: Secondary | ICD-10-CM

## 2016-06-01 DIAGNOSIS — M47817 Spondylosis without myelopathy or radiculopathy, lumbosacral region: Secondary | ICD-10-CM

## 2016-06-01 DIAGNOSIS — N390 Urinary tract infection, site not specified: Secondary | ICD-10-CM | POA: Diagnosis not present

## 2016-06-01 LAB — URINE CULTURE

## 2016-06-01 MED ORDER — EFLORNITHINE HCL 13.9 % EX CREA: 1.0000 "application " | TOPICAL_CREAM | Freq: Two times a day (BID) | CUTANEOUS | 3 refills | Status: DC

## 2016-06-01 MED ORDER — AMOXICILLIN-POT CLAVULANATE 875-125 MG PO TABS: 1.0000 | ORAL_TABLET | Freq: Two times a day (BID) | ORAL | 3 refills | Status: DC

## 2016-06-01 MED ORDER — DULOXETINE HCL 30 MG PO CPEP: 30.0000 mg | ORAL_CAPSULE | Freq: Every day | ORAL | 2 refills | Status: DC

## 2016-06-01 NOTE — Progress Notes (Signed)
Subjective:    Patient ID: Kaylee Weiss, female    DOB: 02-02-1946, 71 y.o.   MRN: [id number]  06/01/2016  Follow-up (states she is here for recurrent UTI; patient states she brought an urine in on 24th.  states she doesn't need to give another urine); Urinary Tract Infection (states she is not sure of pain or sensation due to back issues); and Medication Refill (patient would like to have another brand of Eflornithine called in)   HPI This 71 y.o. female presents for evaluation of urinary symptoms.   Has never undergone urological evaluation.  Very resistant to undergo extensive urological evaluation.  Suffers with ongoing urinary incontinence.  Urinary incontinence is not worse; has been really bad.   Has adjusted behaviorly.  Wears pad large not supersize.  Fecal incontinence is definitely progressed and seems to lead to recurrent UTIs.  Oozes fecally a lot.  May not have b.m. For 48 hours and then will ooze for 24 hours. No sensation of fecal incontinence or urinary incontinence.  Might can tell when having dysuria; odor is overwhelming when has UTI.  Back pain might worsen.  Feels weaker than in past few years. Feels weaker going up the stairs.  Feels muscular.  Going from up from floor to standing.  Progressive decline over the past few years.  Loves Dr. Ubaldo Glassing.  Really enjoyed her.  Seeing rheumatologist in July at [facility].   No one can get it.  HAs tried [facility] and [facility].  Has not tried [employer].  Chronic lower back pain: can tolerate 125mg  Lyrica qhs but suffers with hypersomnolence the following day.  Cannot participate in reading group.Left with B severe shin pain B.  Injection not successful last time; going in  Again soon above and below to see if better results.  Pain 5/10 with episodes of 7-8/10.  Definitely, step up.  Across the board.  In the pain all the time.  Never out of pain.  Takes Lyrica only at bedtime; cannot tolerate Lyrica during the day.  At that time,  Celebrex much improved. Tramadol not effective; Tylenol not effective.  With acute esclation of pain, will take Percocet and Ambien.  Cannot tolerate two Percocet at a time. Vicodin not effective; codeine makes sick.  Easy to get vomit.  Usually needs Percocet sparingly; usually takes four per year.   Review of Systems  Constitutional: Negative for chills, diaphoresis, fatigue and fever.  HENT: Negative for ear pain, postnasal drip, rhinorrhea, sinus pressure, sore throat and trouble swallowing.   Respiratory: Negative for cough and shortness of breath.   Cardiovascular: Negative for chest pain, palpitations and leg swelling.  Gastrointestinal: Negative for abdominal pain, constipation, diarrhea, nausea and vomiting.  Genitourinary: Positive for difficulty urinating.  Musculoskeletal: Positive for back pain.  Neurological: Positive for weakness and numbness.    Past Medical History:  Diagnosis Date  . Anxiety   . Arthritis   . Cauda equina syndrome ([place])   . Cervical spinal stenosis   . Fibromyalgia   . Herpes labialis   . Hyperlipidemia   . Hypertension   . Lumbar spinal stenosis   . Macular degeneration, right eye   . Migraine   . PE (pulmonary thromboembolism) ([place])   . Pulmonary embolism ([place])    age 89; not on OCP.  Coumadin x 2 years.  . Retinal vein occlusion    age 26.  Marland Kitchen Spinal stenosis of lumbar region 12/16/2015   L3-4 level   Past Surgical History:  Procedure Laterality Date  . ABDOMINAL SURGERY    . HAND SURGERY Left   . ROTATOR CUFF REPAIR Right    Allergies  Allergen Reactions  . Demerol [Meperidine] Anaphylaxis  . Gabapentin Other (See Comments)  . Codeine     Other reaction(s): Other (See Comments)  . Other Other (See Comments)    Suicidal ideation Alcohol (drinking kind); takes liver enzymes and elevates them  . Percocet [Oxycodone-Acetaminophen] Nausea And Vomiting    Social History   Social History  . Marital status: Married    Spouse name:  Herbie Baltimore  . Number of children: 2  . Years of education: RN   Occupational History  . 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 Smoking status: Never Smoker  . Smokeless tobacco: Never Used  . Alcohol use 0.0 oz/week     Comment: 15 cc/wk  . Drug use: No  . Sexual activity: No     Comment: Married   Other Topics Concern  . Not on file   Social History Narrative   Marital status:  Married x 47 years      Children: 2 children (49, 75); no granchildren      Lives: with husband, german shepard, kitkat/kitten      Employed: works once per week; has a Patent attorney one day per week; previous therapy practice in 2018.        Tobacco: never      Alcohol:  Socially; one glass of wine per week      Exercise:  Daily exercise; 3.0 miles per day      Advanced Directives:  FULL CODE; no prolonged measures      ADLs: independent with ADLs.        Right-handed   Caffeine: 2 cups of coffee per day         Family History  Problem Relation Age of Onset  . Heart attack Mother   . Stroke Mother     CVA age 31; repeat at age 90 as cause of death.  . Diabetes Mother   . Heart disease Mother 31    recurrent AMIs  . Hyperlipidemia Mother   . Hypertension Mother   . Diabetes Father   . Heart attack Father   . Cancer Father 48    lung cancer  . Hyperlipidemia Father   . Hypertension Father   . Asthma Son        Objective:    BP 115/74   Pulse (!) 56   Temp 98.1 F (36.7 C) (Oral)   Resp 16   Ht 5\' 5"  (1.651 m)   Wt 144 lb (65.3 kg)   SpO2 96%   BMI 23.96 kg/m  Physical Exam  Constitutional: She is oriented to person, place, and time. She appears well-developed and well-nourished. No distress.  HENT:  Head: Normocephalic and atraumatic.  Eyes: Conjunctivae are normal. Pupils are equal, round, and reactive to light.  Neck: Normal range of motion. Neck supple.  Cardiovascular: Normal rate, regular rhythm and normal heart sounds.   Exam reveals no gallop and no friction rub.   No murmur heard. Pulmonary/Chest: Effort normal and breath sounds normal. She has no wheezes. She has no rales.  Abdominal: Soft. Bowel sounds are normal. She exhibits no distension and no mass. There is no tenderness. There is no rebound and no guarding.  Musculoskeletal:       Lumbar back:  She exhibits pain and spasm. She exhibits normal range of motion, no tenderness, no bony tenderness and normal pulse.  Neurological: She is alert and oriented to person, place, and time.  Skin: She is not diaphoretic.  Psychiatric: She has a normal mood and affect. Her behavior is normal.  Nursing note and vitals reviewed.  Results for orders placed or performed in visit on 05/28/16  Urine culture  Result Value Ref Range   Urine Culture, Routine Final report (A)    Urine Culture result 1 Escherichia coli (A)    ANTIMICROBIAL SUSCEPTIBILITY Comment   POCT urinalysis dipstick  Result Value Ref Range   Color, UA yellow yellow   Clarity, UA clear clear   Glucose, UA negative negative   Bilirubin, UA negative negative   Ketones, POC UA negative negative   Spec Grav, UA 1.020 1.030 - 1.035   Blood, UA trace-intact (A) negative   pH, UA 5.0 5.0 - 8.0   Protein Ur, POC negative negative   Urobilinogen, UA 0.2 Negative - 2.0   Nitrite, UA Positive (A) Negative   Leukocytes, UA small (1+) (A) Negative  POCT Microscopic Urinalysis ([facility])  Result Value Ref Range   WBC,UR,HPF,POC Few (A) None WBC/hpf   RBC,UR,HPF,POC Few (A) None RBC/hpf   Bacteria Many (A) None, Too numerous to count   Mucus Absent Absent   Epithelial Cells, UR Per Microscopy None None, Too numerous to count cells/hpf       Assessment & Plan:   1. Recurrent UTI   2. Cauda equina syndrome ([place])   3. Lumbar and sacral osteoarthritis   4. Continuous leakage of urine    -refill of Augmentin provided; send urine culture.  Refer to urology due to cauda equina syndrome; warrants  urodynamic studies and further guidance by urology. -continue with pain management for DDD lumbar spine; undergoing injections per [facility].  Pt agreeable to trial of Cymbalta for pain control;    Orders Placed This Encounter  Procedures  . Ambulatory referral to Urology    Referral Priority:   Routine    Referral Type:   Consultation    Referral Reason:   Specialty Services Required    Requested Specialty:   Urology    Number of Visits Requested:   1   Meds ordered this encounter  Medications  . amoxicillin-clavulanate (AUGMENTIN) 875-125 MG tablet    Sig: Take 1 tablet by mouth 2 (two) times daily.    Dispense:  14 tablet    Refill:  3  . Eflornithine HCl 13.9 % cream    Sig: Apply 1 application topically 2 (two) times daily.    Dispense:  45 g    Refill:  3  . DULoxetine (CYMBALTA) 30 MG capsule    Sig: Take 1 capsule (30 mg total) by mouth daily.    Dispense:  30 capsule    Refill:  2    Return in about 2 months (around 08/01/2016) for recheck.   Raha Tennison Elayne Guerin, M.D. [facility] [address] [place], [place]  [phone] phone [phone] fax

## 2016-06-01 NOTE — Patient Instructions (Addendum)
   IF you received an x-ray today, you will receive an invoice from [facility]. Please contact [facility] at [phone] with questions or concerns regarding your invoice.   IF you received labwork today, you will receive an invoice from [facility]. Please contact [facility] at [phone] with questions or concerns regarding your invoice.   Our billing staff will not be able to assist you with questions regarding bills from these companies.  You will be contacted with the lab results as soon as they are available. The fastest way to get your results is to activate your My Chart account. Instructions are located on the last page of this paperwork. If you have not heard from us regarding the results in 2 weeks, please contact this office.     Urinary Tract Infection, Adult A urinary tract infection (UTI) is an infection of any part of the urinary tract, which includes the kidneys, ureters, bladder, and urethra. These organs make, store, and get rid of urine in the body. UTI can be a bladder infection (cystitis) or kidney infection (pyelonephritis). What are the causes? This infection may be caused by fungi, viruses, or bacteria. Bacteria are the most common cause of UTIs. This condition can also be caused by repeated incomplete emptying of the bladder during urination. What increases the risk? This condition is more likely to develop if:  You ignore your need to urinate or hold urine for long periods of time.  You do not empty your bladder completely during urination.  You wipe back to front after urinating or having a bowel movement, if you are female.  You are uncircumcised, if you are female.  You are constipated.  You have a urinary catheter that stays in place (indwelling).  You have a weak defense (immune) system.  You have a medical condition that affects your bowels, kidneys, or bladder.  You have diabetes.  You take antibiotic medicines frequently or  for long periods of time, and the antibiotics no longer work well against certain types of infections (antibiotic resistance).  You take medicines that irritate your urinary tract.  You are exposed to chemicals that irritate your urinary tract.  You are female. What are the signs or symptoms? Symptoms of this condition include:  Fever.  Frequent urination or passing small amounts of urine frequently.  Needing to urinate urgently.  Pain or burning with urination.  Urine that smells bad or unusual.  Cloudy urine.  Pain in the lower abdomen or back.  Trouble urinating.  Blood in the urine.  Vomiting or being less hungry than normal.  Diarrhea or abdominal pain.  Vaginal discharge, if you are female. How is this diagnosed? This condition is diagnosed with a medical history and physical exam. You will also need to provide a urine sample to test your urine. Other tests may be done, including:  Blood tests.  Sexually transmitted disease (STD) testing. If you have had more than one UTI, a cystoscopy or imaging studies may be done to determine the cause of the infections. How is this treated? Treatment for this condition often includes a combination of two or more of the following:  Antibiotic medicine.  Other medicines to treat less common causes of UTI.  Over-the-counter medicines to treat pain.  Drinking enough water to stay hydrated. Follow these instructions at home:  Take over-the-counter and prescription medicines only as told by your health care provider.  If you were prescribed an antibiotic, take it as told by your health care   provider. Do not stop taking the antibiotic even if you start to feel better.  Avoid alcohol, caffeine, tea, and carbonated beverages. They can irritate your bladder.  Drink enough fluid to keep your urine clear or pale yellow.  Keep all follow-up visits as told by your health care provider. This is important.  Make sure  to:  Empty your bladder often and completely. Do not hold urine for long periods of time.  Empty your bladder before and after sex.  Wipe from front to back after a bowel movement if you are female. Use each tissue one time when you wipe. Contact a health care provider if:  You have back pain.  You have a fever.  You feel nauseous or vomit.  Your symptoms do not get better after 3 days.  Your symptoms go away and then return. Get help right away if:  You have severe back pain or lower abdominal pain.  You are vomiting and cannot keep down any medicines or water. This information is not intended to replace advice given to you by your health care provider. Make sure you discuss any questions you have with your health care provider. Document Released: 12/01/2004 Document Revised: 08/05/2015 Document Reviewed: 01/12/2015 Elsevier Interactive Patient Education  2017 Elsevier Inc.  

## 2016-06-10 DIAGNOSIS — M5416 Radiculopathy, lumbar region: Secondary | ICD-10-CM | POA: Diagnosis not present

## 2016-06-10 DIAGNOSIS — M48061 Spinal stenosis, lumbar region without neurogenic claudication: Secondary | ICD-10-CM | POA: Diagnosis not present

## 2016-06-14 ENCOUNTER — Ambulatory Visit: Payer: Self-pay | Admitting: Family Medicine

## 2016-06-16 ENCOUNTER — Ambulatory Visit: Payer: Medicare Other | Admitting: Neurology

## 2016-06-22 ENCOUNTER — Ambulatory Visit (INDEPENDENT_AMBULATORY_CARE_PROVIDER_SITE_OTHER): Payer: Medicare Other | Admitting: Neurology

## 2016-06-22 ENCOUNTER — Encounter: Payer: Self-pay | Admitting: Neurology

## 2016-06-22 VITALS — BP 129/73 | HR 54 | Ht 65.0 in

## 2016-06-22 DIAGNOSIS — H353131 Nonexudative age-related macular degeneration, bilateral, early dry stage: Secondary | ICD-10-CM | POA: Diagnosis not present

## 2016-06-22 DIAGNOSIS — R413 Other amnesia: Secondary | ICD-10-CM | POA: Diagnosis not present

## 2016-06-22 DIAGNOSIS — H2513 Age-related nuclear cataract, bilateral: Secondary | ICD-10-CM | POA: Diagnosis not present

## 2016-06-22 DIAGNOSIS — H524 Presbyopia: Secondary | ICD-10-CM | POA: Diagnosis not present

## 2016-06-22 DIAGNOSIS — M47817 Spondylosis without myelopathy or radiculopathy, lumbosacral region: Secondary | ICD-10-CM

## 2016-06-22 DIAGNOSIS — I2510 Atherosclerotic heart disease of native coronary artery without angina pectoris: Secondary | ICD-10-CM

## 2016-06-22 DIAGNOSIS — H40013 Open angle with borderline findings, low risk, bilateral: Secondary | ICD-10-CM | POA: Diagnosis not present

## 2016-06-22 NOTE — Progress Notes (Signed)
Reason for visit: Memory disorder  Kaylee Weiss is an 71 y.o. female  History of present illness:  [name] is a 71 year old right-handed white female with a history of chronic back pain, and lumbosacral spinal stenosis. The patient was last seen in October 2017 with a reported problem with memory and word finding problems. The patient claims that since that time, she has been able to get a better pattern of sleep, and her memory and cognitive processing has improved. The patient has undergone MRI of the brain that was unremarkable. The patient still has some chronic back issues, she is able to walk 3-5 miles a day. She does have some right knee discomfort in the evening hours at times. She has been told by several surgical consultations that she should not have back surgery at this time. The patient is followed through [facility] pain center for epidural steroid injections on occasion. She has been placed on Lyrica and Cymbalta by her primary care physician with good improvement in the back. She returns to this office for further evaluation.  Past Medical History:  Diagnosis Date  . Anxiety   . Arthritis   . Cauda equina syndrome ([place])   . Cervical spinal stenosis   . Fibromyalgia   . Herpes labialis   . Hyperlipidemia   . Hypertension   . Lumbar spinal stenosis   . Macular degeneration, right eye   . Migraine   . PE (pulmonary thromboembolism) ([place])   . Pulmonary embolism ([place])    age 10; not on OCP.  Coumadin x 2 years.  . Retinal vein occlusion    age 68.  Marland Kitchen Spinal stenosis of lumbar region 12/16/2015   L3-4 level    Past Surgical History:  Procedure Laterality Date  . ABDOMINAL SURGERY    . HAND SURGERY Left   . ROTATOR CUFF REPAIR Right     Family History  Problem Relation Age of Onset  . Heart attack Mother   . Stroke Mother     CVA age 32; repeat at age 86 as cause of death.  . Diabetes Mother   . Heart disease Mother 89    recurrent AMIs  .  Hyperlipidemia Mother   . Hypertension Mother   . Diabetes Father   . Heart attack Father   . Cancer Father 43    lung cancer  . Hyperlipidemia Father   . Hypertension Father   . Asthma Son     Social history:  reports that she has never smoked. She has never used smokeless tobacco. She reports that she drinks alcohol. She reports that she does not use drugs.    Allergies  Allergen Reactions  . Demerol [Meperidine] Anaphylaxis  . Gabapentin Other (See Comments)  . Codeine     Other reaction(s): Other (See Comments)  . Other Other (See Comments)    Suicidal ideation Alcohol (drinking kind); takes liver enzymes and elevates them  . Percocet [Oxycodone-Acetaminophen] Nausea And Vomiting    Medications:  Prior to Admission medications   Medication Sig Start Date End Date Taking? Authorizing Provider  acyclovir ointment (ZOVIRAX) 5 % Apply topically.   Yes Historical Provider, MD  aspirin 81 MG tablet Take 81 mg by mouth daily.   Yes Historical Provider, MD  atorvastatin (LIPITOR) 10 MG tablet Take 1 tablet (10 mg total) by mouth daily. 03/22/16  Yes Wardell Honour, MD  buPROPion (WELLBUTRIN XL) 150 MG 24 hr tablet Take 1 tablet (150 mg total) by  mouth daily. 03/22/16  Yes Wardell Honour, MD  celecoxib (CELEBREX) 200 MG capsule Take 1 capsule (200 mg total) by mouth 2 (two) times daily. 03/22/16  Yes Wardell Honour, MD  DULoxetine (CYMBALTA) 30 MG capsule Take 1 capsule (30 mg total) by mouth daily. 06/01/16  Yes Wardell Honour, MD  Eflornithine HCl 13.9 % cream Apply 1 application topically 2 (two) times daily. 06/01/16  Yes Wardell Honour, MD  folic acid (FOLVITE) 1 MG tablet Take 1 tablet (1 mg total) by mouth daily. 03/22/16  Yes Wardell Honour, MD  losartan (COZAAR) 25 MG tablet Take 1 tablet (25 mg total) by mouth daily. 03/22/16  Yes Wardell Honour, MD  metoprolol (TOPROL-XL) 200 MG 24 hr tablet Take 1 tablet (200 mg total) by mouth daily. 03/22/16  Yes Wardell Honour, MD  OVER THE  COUNTER MEDICATION OTC B6 100 mgm taking daily   Yes Historical Provider, MD  OVER THE COUNTER MEDICATION OTC preservision areds taking 1 in the am/qhs   Yes Historical Provider, MD  OVER THE COUNTER MEDICATION OTC B12-SL taking everyday   Yes Historical Provider, MD  OVER THE COUNTER MEDICATION Reported on 04/17/2015   Yes Historical Provider, MD  pregabalin (LYRICA) 100 MG capsule Take 1 capsule (100 mg total) by mouth at bedtime. 03/22/16  Yes Wardell Honour, MD  pregabalin (LYRICA) 25 MG capsule Take 1 capsule (25 mg total) by mouth daily. 03/31/16  Yes Wardell Honour, MD  tolterodine (DETROL LA) 2 MG 24 hr capsule Take 1 capsule (2 mg total) by mouth daily. 03/22/16  Yes Wardell Honour, MD  UNABLE TO FIND Uses Lidocaine topical cream @@ night   Yes Historical Provider, MD  valACYclovir (VALTREX) 500 MG tablet Take 1 tablet (500 mg total) by mouth 2 (two) times daily. 03/22/16  Yes Wardell Honour, MD  zolpidem Lorrin Mais) 5 MG tablet take 1 tablet by mouth at bedtime if needed for sleep 03/22/16  Yes Wardell Honour, MD    ROS:  Out of a complete 14 system review of symptoms, the patient complains only of the following symptoms, and all other reviewed systems are negative.  Incontinence of the bowels and bladder Low back pain  Blood pressure 129/73, pulse (!) 54, height 5\' 5"  (1.651 m), SpO2 98 %.  Physical Exam  General: The patient is alert and cooperative at the time of the examination.  Skin: No significant peripheral edema is noted.   Neurologic Exam  Mental status: The patient is alert and oriented x 3 at the time of the examination. The patient has apparent normal recent and remote memory, with an apparently normal attention span and concentration ability.   Cranial nerves: Facial symmetry is present. Speech is normal, no aphasia or dysarthria is noted. Extraocular movements are full. Visual fields are full.  Motor: The patient has good strength in all 4 extremities.  Sensory  examination: Soft touch sensation is symmetric on the face, arms, and legs.  Coordination: The patient has good finger-nose-finger and heel-to-shin bilaterally.  Gait and station: The patient has a normal gait. Tandem gait is normal. Romberg is negative. No drift is seen.  Reflexes: Deep tendon reflexes are symmetric.   MRI brain 12/30/15:  IMPRESSION: Unremarkable MRI scan of the brain without contrast.   MRI lumbar 12/19/14:  IMPRESSION:   1. Moderate to severe right neuroforaminal stenosis at L2-3, L3-4 and L4-5 secondary to degenerative disc disease and facet arthrosis.  2.  Multilevel moderate to severe canal stenosis secondary to degenerative disc disease and ligamentum flavum and facet hypertrophy.  3. Grade 2 degenerative anterolisthesis of L3 on L4 and lumbar levoscoliosis, unchanged from comparison radiograph of November 25, 2014.   Assessment/Plan:  1. Reported memory disturbance, resolved  2. Chronic low back pain  The patient overall is doing quite well at this time. The patient indicates that her memory disturbance has completely improved when she has gained better benefit with her chronic pain issues, and she subsequently has been able to sleep better. The patient will follow-up through this office on an as-needed basis.  Jill Alexanders MD 06/22/2016 10:35 AM  [facility] [address] [place], [place] [address]  Phone [phone] Fax [phone]

## 2016-06-29 ENCOUNTER — Encounter: Payer: Self-pay | Admitting: Family Medicine

## 2016-06-29 ENCOUNTER — Ambulatory Visit (INDEPENDENT_AMBULATORY_CARE_PROVIDER_SITE_OTHER): Payer: Medicare Other | Admitting: Family Medicine

## 2016-06-29 VITALS — BP 109/70 | HR 52 | Temp 97.4°F | Resp 16 | Ht 65.0 in | Wt 143.0 lb

## 2016-06-29 DIAGNOSIS — I2510 Atherosclerotic heart disease of native coronary artery without angina pectoris: Secondary | ICD-10-CM | POA: Diagnosis not present

## 2016-06-29 DIAGNOSIS — M48061 Spinal stenosis, lumbar region without neurogenic claudication: Secondary | ICD-10-CM

## 2016-06-29 DIAGNOSIS — N3941 Urge incontinence: Secondary | ICD-10-CM | POA: Diagnosis not present

## 2016-06-29 DIAGNOSIS — M47817 Spondylosis without myelopathy or radiculopathy, lumbosacral region: Secondary | ICD-10-CM | POA: Diagnosis not present

## 2016-06-29 DIAGNOSIS — M797 Fibromyalgia: Secondary | ICD-10-CM | POA: Diagnosis not present

## 2016-06-29 DIAGNOSIS — K5903 Drug induced constipation: Secondary | ICD-10-CM | POA: Diagnosis not present

## 2016-06-29 DIAGNOSIS — G834 Cauda equina syndrome: Secondary | ICD-10-CM

## 2016-06-29 MED ORDER — DULOXETINE HCL 30 MG PO CPEP: 30.0000 mg | ORAL_CAPSULE | Freq: Every day | ORAL | 1 refills | Status: DC

## 2016-06-29 NOTE — Patient Instructions (Signed)
     IF you received an x-ray today, you will receive an invoice from [facility]. Please contact [facility] at [phone] with questions or concerns regarding your invoice.   IF you received labwork today, you will receive an invoice from [facility]. Please contact [facility] at [phone] with questions or concerns regarding your invoice.   Our billing staff will not be able to assist you with questions regarding bills from these companies.  You will be contacted with the lab results as soon as they are available. The fastest way to get your results is to activate your My Chart account. Instructions are located on the last page of this paperwork. If you have not heard from us regarding the results in 2 weeks, please contact this office.     

## 2016-06-29 NOTE — Progress Notes (Signed)
Subjective:    Patient ID: Kaylee Weiss, female    DOB: 1945/09/03, 71 y.o.   MRN: [id number]  06/29/2016  Follow-up; medication (wants to discuss Cymbalta); and Medication Refill (Acyclovir ointment; needs for eye surgery; need to take 1 month in advance and 1 month after)   HPI This 71 y.o. female presents for evaluation of   Started Cymbalta; took one dose and felt better.  In a few days, felt tremendously better.  Had lumbar injection five days ago; now two weeks later.  Now having a little back pian.  Knee and leg pain are most bothersome pain; usually does not notice back pain becuae of severity of knee and leg pain.  Last visit 5-6/10; now 1-2/10.  Back pain is minimal.Just stand up and get out of bed.  Husband is amazed.  Cymbalta is striking.  Cannot believe it.  Has a great placebo affect from all medication.  Having pin and needles in anterior R leg.  Has already walked 3 miles this morning.  Especially at night, has pins and needles.  Side effects of Cymbalta:  No side effect profile will make patient want to stop medication; suffering with nausea.  Concern that hard to get off of it.  Taking Cymbalta every morning; taking after cream of wheat.   Macular degeneration in R eye: rapidly decline in vision since January 2018.  Wants to perform cataract surgery and correct astymatism.  Wants to improve vision.  Retina specialist does not feel will be helpful.  History of blood clot in RIGHT eye.   Constipation: blessing to be constipated with Cymbalta.   Hope that will prevent UTIs.  Urinary retention: begging to cancel appointment with urology. Taking Ambien and Melatonin every night.  Abdominal discomft: no nausea; no vomiting; generalized discomfort.  Yucky feeling.  Constipated; still having several bowel movements.  Not loose.  No leakage.  No heartburn.  No urinary symptoms.    Wt Readings from Last 3 Encounters:  06/29/16 143 lb (64.9 kg)  06/01/16 144 lb (65.3 kg)    12/16/15 143 lb (64.9 kg)     Review of Systems  Constitutional: Negative for chills, diaphoresis, fatigue and fever.  Eyes: Negative for visual disturbance.  Respiratory: Negative for cough and shortness of breath.   Cardiovascular: Negative for chest pain, palpitations and leg swelling.  Gastrointestinal: Positive for abdominal pain and constipation. Negative for abdominal distention, anal bleeding, blood in stool, diarrhea, nausea, rectal pain and vomiting.  Endocrine: Negative for cold intolerance, heat intolerance, polydipsia, polyphagia and polyuria.  Genitourinary: Positive for urgency. Negative for dysuria, enuresis, flank pain, frequency, hematuria and pelvic pain.  Musculoskeletal: Positive for back pain.  Neurological: Negative for dizziness, tremors, seizures, syncope, facial asymmetry, speech difficulty, weakness, light-headedness, numbness and headaches.    Past Medical History:  Diagnosis Date  . Anxiety   . Arthritis   . Cauda equina syndrome ([place])   . Cervical spinal stenosis   . Fibromyalgia   . Herpes labialis   . Hyperlipidemia   . Hypertension   . Lumbar spinal stenosis   . Macular degeneration, right eye   . Migraine   . PE (pulmonary thromboembolism) ([place])   . Pulmonary embolism ([place])    age 61; not on OCP.  Coumadin x 2 years.  . Retinal vein occlusion    age 64.  Marland Kitchen Spinal stenosis of lumbar region 12/16/2015   L3-4 level   Past Surgical History:  Procedure Laterality Date  . ABDOMINAL SURGERY    .  HAND SURGERY Left   . ROTATOR CUFF REPAIR Right    Allergies  Allergen Reactions  . Demerol [Meperidine] Anaphylaxis  . Gabapentin Other (See Comments)  . Codeine     Other reaction(s): Other (See Comments)  . Other Other (See Comments)    Suicidal ideation Alcohol (drinking kind); takes liver enzymes and elevates them  . Percocet [Oxycodone-Acetaminophen] Nausea And Vomiting    Social History   Social History  . Marital status: Married     Spouse name: Herbie Baltimore  . Number of children: 2  . Years of education: RN   Occupational History  . 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 Smoking status: Never Smoker  . Smokeless tobacco: Never Used  . Alcohol use 0.0 oz/week     Comment: 15 cc/wk  . Drug use: No  . Sexual activity: No     Comment: Married   Other Topics Concern  . Not on file   Social History Narrative   Marital status:  Married x 47 years      Children: 2 children (49, 93); no granchildren      Lives: with husband, german shepard, kitkat/kitten      Employed: works once per week; has a Patent attorney one day per week; previous therapy practice in 2018.        Tobacco: never      Alcohol:  Socially; one glass of wine per week      Exercise:  Daily exercise; 3.0 miles per day      Advanced Directives:  FULL CODE; no prolonged measures      ADLs: independent with ADLs.        Right-handed   Caffeine: 2 cups of coffee per day         Family History  Problem Relation Age of Onset  . Heart attack Mother   . Stroke Mother        CVA age 27; repeat at age 42 as cause of death.  . Diabetes Mother   . Heart disease Mother 60       recurrent AMIs  . Hyperlipidemia Mother   . Hypertension Mother   . Diabetes Father   . Heart attack Father   . Cancer Father 64       lung cancer  . Hyperlipidemia Father   . Hypertension Father   . Asthma Son        Objective:    BP 109/70   Pulse (!) 52   Temp 97.4 F (36.3 C) (Oral)   Resp 16   Ht 5\' 5"  (1.651 m)   Wt 143 lb (64.9 kg) Comment: Refused to weigh in office.  Verbally gave weight from home  SpO2 99%   BMI 23.80 kg/m  Physical Exam  Constitutional: She is oriented to person, place, and time. She appears well-developed and well-nourished. No distress.  HENT:  Head: Normocephalic and atraumatic.  Right Ear: External ear normal.  Left Ear: External ear normal.  Nose: Nose normal.  Mouth/Throat:  Oropharynx is clear and moist.  Eyes: Conjunctivae and EOM are normal. Pupils are equal, round, and reactive to light.  Neck: Normal range of motion. Neck supple. Carotid bruit is not present. No thyromegaly present.  Cardiovascular: Normal rate, regular rhythm, normal heart sounds and intact distal pulses.  Exam reveals no gallop and no friction rub.   No murmur heard. Pulmonary/Chest: Effort normal and breath  sounds normal. She has no wheezes. She has no rales.  Abdominal: Soft. Bowel sounds are normal. She exhibits no distension and no mass. There is no tenderness. There is no rebound and no guarding.  Musculoskeletal:       Lumbar back: She exhibits pain. She exhibits normal range of motion, no tenderness, no bony tenderness, no spasm and normal pulse.  Lymphadenopathy:    She has no cervical adenopathy.  Neurological: She is alert and oriented to person, place, and time. No cranial nerve deficit.  Skin: Skin is warm and dry. No rash noted. She is not diaphoretic. No erythema. No pallor.  Psychiatric: She has a normal mood and affect. Her behavior is normal.        Assessment & Plan:   1. Cauda equina syndrome ([place])   2. Drug-induced constipation   3. Lumbar and sacral osteoarthritis   4. Fibromyalgia   5. Spinal stenosis of lumbar region without neurogenic claudication   6. Urge incontinence of urine    -Cymbalta has greatly improved pain control; continue current dose of medication. -pt suffering with constipation from Cymbalta which is welcoming due to fecal incontinence due to cauda equina syndrome; having mild abdominal discomfort due to constipation yet is tolerable at this time. -pt refusing/declining urology consultation at this time as no recurrent UTI since fecal incontinence has improved. Will recommend urology consultation if recurrent UTIs continue as concern for urinary retention due to cauda equina syndrome.   No orders of the defined types were placed in this  encounter.  Meds ordered this encounter  Medications  . DULoxetine (CYMBALTA) 30 MG capsule    Sig: Take 1 capsule (30 mg total) by mouth daily.    Dispense:  90 capsule    Refill:  1    No Follow-up on file.   Kristi Elayne Guerin, M.D. Primary Care at [facility] previously Ur[facility] [place] [address] [place], [place]  [address] [phone] phone [phone] fax

## 2016-07-06 ENCOUNTER — Encounter: Payer: Self-pay | Admitting: Family Medicine

## 2016-07-18 DIAGNOSIS — H2511 Age-related nuclear cataract, right eye: Secondary | ICD-10-CM | POA: Diagnosis not present

## 2016-07-25 DIAGNOSIS — K5900 Constipation, unspecified: Secondary | ICD-10-CM | POA: Insufficient documentation

## 2016-07-28 DIAGNOSIS — H1131 Conjunctival hemorrhage, right eye: Secondary | ICD-10-CM | POA: Diagnosis not present

## 2016-08-04 DIAGNOSIS — H1131 Conjunctival hemorrhage, right eye: Secondary | ICD-10-CM | POA: Diagnosis not present

## 2016-08-08 DIAGNOSIS — H2512 Age-related nuclear cataract, left eye: Secondary | ICD-10-CM | POA: Diagnosis not present

## 2016-08-08 DIAGNOSIS — H2511 Age-related nuclear cataract, right eye: Secondary | ICD-10-CM | POA: Diagnosis not present

## 2016-08-08 DIAGNOSIS — H25811 Combined forms of age-related cataract, right eye: Secondary | ICD-10-CM | POA: Diagnosis not present

## 2016-08-16 DIAGNOSIS — H2512 Age-related nuclear cataract, left eye: Secondary | ICD-10-CM | POA: Diagnosis not present

## 2016-08-19 DIAGNOSIS — D220 Melanocytic nevi of lip: Secondary | ICD-10-CM | POA: Diagnosis not present

## 2016-08-19 DIAGNOSIS — D485 Neoplasm of uncertain behavior of skin: Secondary | ICD-10-CM | POA: Diagnosis not present

## 2016-08-19 DIAGNOSIS — D2239 Melanocytic nevi of other parts of face: Secondary | ICD-10-CM | POA: Diagnosis not present

## 2016-08-22 DIAGNOSIS — H25812 Combined forms of age-related cataract, left eye: Secondary | ICD-10-CM | POA: Diagnosis not present

## 2016-08-22 DIAGNOSIS — H2512 Age-related nuclear cataract, left eye: Secondary | ICD-10-CM | POA: Diagnosis not present

## 2016-09-06 ENCOUNTER — Ambulatory Visit: Payer: Medicare Other | Admitting: Family Medicine

## 2016-09-20 ENCOUNTER — Other Ambulatory Visit: Payer: Self-pay | Admitting: Family Medicine

## 2016-09-23 NOTE — Telephone Encounter (Signed)
Please call in or fax to pharmacy as approved Zolpidem.

## 2016-09-24 ENCOUNTER — Telehealth: Payer: Self-pay

## 2016-09-24 NOTE — Telephone Encounter (Signed)
Rx for [facility] faxed to pharmacy.

## 2016-09-27 ENCOUNTER — Ambulatory Visit (INDEPENDENT_AMBULATORY_CARE_PROVIDER_SITE_OTHER): Payer: Medicare Other | Admitting: Family Medicine

## 2016-09-27 ENCOUNTER — Encounter: Payer: Self-pay | Admitting: Family Medicine

## 2016-09-27 VITALS — BP 114/74 | Temp 97.6°F | Resp 18

## 2016-09-27 DIAGNOSIS — G834 Cauda equina syndrome: Secondary | ICD-10-CM | POA: Diagnosis not present

## 2016-09-27 DIAGNOSIS — E7800 Pure hypercholesterolemia, unspecified: Secondary | ICD-10-CM | POA: Diagnosis not present

## 2016-09-27 DIAGNOSIS — M797 Fibromyalgia: Secondary | ICD-10-CM | POA: Diagnosis not present

## 2016-09-27 DIAGNOSIS — I2510 Atherosclerotic heart disease of native coronary artery without angina pectoris: Secondary | ICD-10-CM | POA: Diagnosis not present

## 2016-09-27 DIAGNOSIS — F5101 Primary insomnia: Secondary | ICD-10-CM

## 2016-09-27 DIAGNOSIS — I10 Essential (primary) hypertension: Secondary | ICD-10-CM

## 2016-09-27 DIAGNOSIS — N3941 Urge incontinence: Secondary | ICD-10-CM | POA: Diagnosis not present

## 2016-09-27 DIAGNOSIS — K5903 Drug induced constipation: Secondary | ICD-10-CM

## 2016-09-27 DIAGNOSIS — M48061 Spinal stenosis, lumbar region without neurogenic claudication: Secondary | ICD-10-CM | POA: Diagnosis not present

## 2016-09-27 MED ORDER — DULOXETINE HCL 20 MG PO CPEP: 30.0000 mg | ORAL_CAPSULE | Freq: Every day | ORAL | 1 refills | Status: DC

## 2016-09-27 MED ORDER — ZOLPIDEM TARTRATE 5 MG PO TABS: 2.5000 mg | ORAL_TABLET | Freq: Every evening | ORAL | 5 refills | Status: DC | PRN

## 2016-09-27 NOTE — Patient Instructions (Signed)
     IF you received an x-ray today, you will receive an invoice from [facility]. Please contact [facility] at [phone] with questions or concerns regarding your invoice.   IF you received labwork today, you will receive an invoice from [facility]. Please contact [facility] at [phone] with questions or concerns regarding your invoice.   Our billing staff will not be able to assist you with questions regarding bills from these companies.  You will be contacted with the lab results as soon as they are available. The fastest way to get your results is to activate your My Chart account. Instructions are located on the last page of this paperwork. If you have not heard from us regarding the results in 2 weeks, please contact this office.     

## 2016-09-27 NOTE — Progress Notes (Signed)
Subjective:    Patient ID: Kaylee Weiss, female    DOB: November 18, 1945, 71 y.o.   MRN: [id number]  09/27/2016  Hypertension (6 month follow-up); Fibromyalgia; and Urinary Incontinence   HPI This 71 y.o. female presents for six month follow-up of hypertension, hypercholesterolemia, anxiety/depression, chronic lower back pain due to DDD lumbar spine.  Feels so well.  Cymbalta started and feels fantastic.  Six month duration. Tried cutting down Celebrex but a disaster; had a great return of pain.  Clearly needs all three medications.  Rheumatologist recommends playing around with Celebrex; did not work at all.  Tried to cut back on Ambien and come off of it; that did not work well at all.    Sleep attacks: occurs in the morning; seems separate from Ambien; seems related to Lyrica and Cymbalta.  Must immediately lay down to sleep.  Occurs frequently.  Mother had these sleep attacks before AMI. Really sure that started with Cymbalta.  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  Causes hypersomnolence.  Cymbalta is really constipating which is really helpful.  No UTI since Cymbalta.  Much less urinary incontinent with Cymbalta.   Taking Lyrica 125mg  qhs.   Cymbalta every morning.  Increase in pain in lower back pain and leg pain.  Laid down ten bags of mulch the other day.  Pain worsened after laying mulch.  Loves garden and refuses to not lay mulch.  So much fun working in the garden.  Doing a take care of self garden.    [place] is eating up rose bushes.  People can get rosetta from rose bushes.  Pt sent husband to [facility].  Does have jasmine or fushscia.    Pain is 3/10 at baseline.  Mulching increased pain temporarily.  Cholesterol may be bad; in past week, had four hamburgers from [employer].   BP Readings from Last 3 Encounters:  09/27/16 114/74  06/29/16 109/70  06/22/16 129/73   Wt Readings from Last 3 Encounters:  06/29/16 143 lb (64.9 kg)  06/01/16 144 lb (65.3 kg)   12/16/15 143 lb (64.9 kg)   Immunization History  Administered Date(s) Administered  . Influenza,inj,Quad PF,36+ Mos 11/07/2014, 12/07/2015  . Influenza-Unspecified 12/05/2013  . Pneumococcal Conjugate-13 03/07/2014  . Pneumococcal Polysaccharide-23 11/17/2015  . Pneumococcal-Unspecified 03/07/2010  . Td 02/12/2014  . Zoster 03/07/2005  . Zoster Recombinat (Shingrix) 07/15/2016    Review of Systems  Constitutional: Negative for chills, diaphoresis, fatigue and fever.  Eyes: Negative for visual disturbance.  Respiratory: Negative for cough and shortness of breath.   Cardiovascular: Negative for chest pain, palpitations and leg swelling.  Gastrointestinal: Negative for abdominal pain, constipation, diarrhea, nausea and vomiting.  Endocrine: Negative for cold intolerance, heat intolerance, polydipsia, polyphagia and polyuria.  Genitourinary: Positive for difficulty urinating.  Musculoskeletal: Positive for back pain.  Neurological: Negative for dizziness, tremors, seizures, syncope, facial asymmetry, speech difficulty, weakness, light-headedness, numbness and headaches.  Psychiatric/Behavioral: Positive for sleep disturbance. Negative for dysphoric mood and self-injury. The patient is not nervous/anxious.     Past Medical History:  Diagnosis Date  . Anxiety   . Arthritis   . Cauda equina syndrome ([place])   . Cervical spinal stenosis   . Fibromyalgia   . Herpes labialis   . Hyperlipidemia   . Hypertension   . Lumbar spinal stenosis   . Macular degeneration, right eye   . Migraine   . PE (pulmonary thromboembolism) ([place])   . Pulmonary embolism [facility])    age 54;  not on OCP.  Coumadin x 2 years.  . Retinal vein occlusion    age 73.  Marland Kitchen Spinal stenosis of lumbar region 12/16/2015   L3-4 level   Past Surgical History:  Procedure Laterality Date  . ABDOMINAL SURGERY    . EYE SURGERY    . HAND SURGERY Left   . ROTATOR CUFF REPAIR Right    Allergies  Allergen Reactions  .  Demerol [Meperidine] Anaphylaxis  . Gabapentin Other (See Comments)  . Codeine     Other reaction(s): Other (See Comments)  . Other Other (See Comments)    Suicidal ideation Alcohol (drinking kind); takes liver enzymes and elevates them  . Percocet [Oxycodone-Acetaminophen] Nausea And Vomiting    Social History   Social History  . Marital status: Married    Spouse name: Herbie Baltimore  . Number of children: 2  . Years of education: RN   Occupational History  . 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 Smoking status: Never Smoker  . Smokeless tobacco: Never Used  . Alcohol use 0.0 oz/week     Comment: 15 cc/wk  . Drug use: No  . Sexual activity: No     Comment: Married   Other Topics Concern  . Not on file   Social History Narrative   Marital status:  Married x 47 years      Children: 2 children (49, 4); no granchildren      Lives: with husband, german shepard, kitkat/kitten      Employed: works once per week; has a Patent attorney one day per week; previous therapy practice in 2018.        Tobacco: never      Alcohol:  Socially; one glass of wine per week      Exercise:  Daily exercise; 3.0 miles per day      Advanced Directives:  FULL CODE; no prolonged measures      ADLs: independent with ADLs.        Right-handed   Caffeine: 2 cups of coffee per day         Family History  Problem Relation Age of Onset  . Heart attack Mother   . Stroke Mother        CVA age 66; repeat at age 34 as cause of death.  . Diabetes Mother   . Heart disease Mother 54       recurrent AMIs  . Hyperlipidemia Mother   . Hypertension Mother   . Diabetes Father   . Heart attack Father   . Cancer Father 72       lung cancer  . Hyperlipidemia Father   . Hypertension Father   . Asthma Son        Objective:    BP 114/74   Temp 97.6 F (36.4 C) (Oral)   Resp 18   SpO2 96%  Physical Exam  Constitutional: She is oriented to person,  place, and time. She appears well-developed and well-nourished. No distress.  HENT:  Head: Normocephalic and atraumatic.  Right Ear: External ear normal.  Left Ear: External ear normal.  Nose: Nose normal.  Mouth/Throat: Oropharynx is clear and moist.  Eyes: Pupils are equal, round, and reactive to light. Conjunctivae and EOM are normal.  Neck: Normal range of motion. Neck supple. Carotid bruit is not present. No thyromegaly present.  Cardiovascular: Normal rate, regular rhythm, normal heart sounds and intact distal pulses.  Exam reveals no gallop and no friction rub.   No murmur heard. Pulmonary/Chest: Effort normal and breath sounds normal. She has no wheezes. She has no rales.  Abdominal: Soft. Bowel sounds are normal. She exhibits no distension and no mass. There is no tenderness. There is no rebound and no guarding.  Lymphadenopathy:    She has no cervical adenopathy.  Neurological: She is alert and oriented to person, place, and time. No cranial nerve deficit.  Skin: Skin is warm and dry. No rash noted. She is not diaphoretic. No erythema. No pallor.  Psychiatric: She has a normal mood and affect. Her behavior is normal.    Depression screen [facility] 2/9 09/27/2016 06/29/2016 06/01/2016 03/22/2016 02/17/2016  Decreased Interest 0 0 0 0 0  Down, Depressed, Hopeless 0 0 0 0 0  PHQ - 2 Score 0 0 0 0 0   Fall Risk  09/27/2016 06/29/2016 06/01/2016 03/22/2016 02/17/2016  Falls in the past year? No No No No No  Comment - - - - -  Number falls in past yr: - - - - -  Injury with Fall? - - - - -  Comment - - - - -  Risk Factor Category  - - - - -  Risk for fall due to : - - - - -  Risk for fall due to: Comment - - - - -       Assessment & Plan:   1. Essential hypertension   2. Drug-induced constipation   3. Cauda equina syndrome ([place])   4. Urge incontinence of urine   5. Fibromyalgia   6. Primary insomnia   7. Spinal stenosis of lumbar region without neurogenic claudication   8. Pure  hypercholesterolemia    -controlled hypertension, hypercholesterolemia; obtain labs; continue current medications. -chronic lower back pain due to spinal stenosis and cauda equina syndrome much improved with Cymbalta.  Increase Cymbalta to 40mg  daily.  -urinary incontinence and fecal incontinence has also improved.   No orders of the defined types were placed in this encounter.  Meds ordered this encounter  Medications  . zolpidem (AMBIEN) 5 MG tablet    Sig: Take 0.5-1 tablets (2.5-5 mg total) by mouth at bedtime as needed for sleep.    Dispense:  30 tablet    Refill:  5  . DULoxetine (CYMBALTA) 20 MG capsule    Sig: Take 2 capsules (40 mg total) by mouth daily.    Dispense:  180 capsule    Refill:  1    Return in about 3 months (around 12/28/2016) for recheck chronic lower back pain.   Tristy Udovich Elayne Guerin, M.D. Primary Care at [facility] previously Ur[facility] [place] [address] [place], [place]  [address] [phone] phone [phone] fax

## 2016-10-11 ENCOUNTER — Other Ambulatory Visit: Payer: Self-pay | Admitting: Family Medicine

## 2016-10-11 DIAGNOSIS — E7800 Pure hypercholesterolemia, unspecified: Secondary | ICD-10-CM | POA: Insufficient documentation

## 2016-10-11 DIAGNOSIS — I10 Essential (primary) hypertension: Secondary | ICD-10-CM

## 2016-10-11 NOTE — Telephone Encounter (Signed)
error 

## 2016-10-12 DIAGNOSIS — M48062 Spinal stenosis, lumbar region with neurogenic claudication: Secondary | ICD-10-CM | POA: Diagnosis not present

## 2016-10-12 DIAGNOSIS — G8929 Other chronic pain: Secondary | ICD-10-CM | POA: Diagnosis not present

## 2016-10-12 DIAGNOSIS — M545 Low back pain: Secondary | ICD-10-CM | POA: Diagnosis not present

## 2016-10-12 DIAGNOSIS — M76899 Other specified enthesopathies of unspecified lower limb, excluding foot: Secondary | ICD-10-CM | POA: Diagnosis not present

## 2016-10-12 DIAGNOSIS — M797 Fibromyalgia: Secondary | ICD-10-CM | POA: Diagnosis not present

## 2016-10-12 DIAGNOSIS — M150 Primary generalized (osteo)arthritis: Secondary | ICD-10-CM | POA: Diagnosis not present

## 2016-10-12 DIAGNOSIS — G834 Cauda equina syndrome: Secondary | ICD-10-CM | POA: Diagnosis not present

## 2016-10-16 DIAGNOSIS — M8949 Other hypertrophic osteoarthropathy, multiple sites: Secondary | ICD-10-CM | POA: Insufficient documentation

## 2016-10-16 DIAGNOSIS — M159 Polyosteoarthritis, unspecified: Secondary | ICD-10-CM | POA: Insufficient documentation

## 2016-10-25 ENCOUNTER — Ambulatory Visit (INDEPENDENT_AMBULATORY_CARE_PROVIDER_SITE_OTHER): Payer: Medicare Other | Admitting: Family Medicine

## 2016-10-25 DIAGNOSIS — E7800 Pure hypercholesterolemia, unspecified: Secondary | ICD-10-CM

## 2016-10-25 DIAGNOSIS — I10 Essential (primary) hypertension: Secondary | ICD-10-CM

## 2016-10-25 NOTE — Progress Notes (Signed)
Here for labs

## 2016-10-26 LAB — CBC WITH DIFFERENTIAL/PLATELET
BASOS ABS: 0 10*3/uL (ref 0.0–0.2)
Basos: 0 %
EOS (ABSOLUTE): 0.2 10*3/uL (ref 0.0–0.4)
Eos: 3 %
Hematocrit: 42.5 % (ref 34.0–46.6)
Hemoglobin: 14.4 g/dL (ref 11.1–15.9)
Immature Grans (Abs): 0 10*3/uL (ref 0.0–0.1)
Immature Granulocytes: 0 %
LYMPHS ABS: 2.9 10*3/uL (ref 0.7–3.1)
Lymphs: 39 %
MCH: 32 pg (ref 26.6–33.0)
MCHC: 33.9 g/dL (ref 31.5–35.7)
MCV: 94 fL (ref 79–97)
MONOS ABS: 0.6 10*3/uL (ref 0.1–0.9)
Monocytes: 8 %
NEUTROS ABS: 3.7 10*3/uL (ref 1.4–7.0)
Neutrophils: 50 %
Platelets: 216 10*3/uL (ref 150–379)
RBC: 4.5 x10E6/uL (ref 3.77–5.28)
RDW: 15 % (ref 12.3–15.4)
WBC: 7.4 10*3/uL (ref 3.4–10.8)

## 2016-10-26 LAB — COMPREHENSIVE METABOLIC PANEL
A/G RATIO: 1.9 (ref 1.2–2.2)
ALT: 30 IU/L (ref 0–32)
AST: 26 IU/L (ref 0–40)
Albumin: 3.9 g/dL (ref 3.5–4.8)
Alkaline Phosphatase: 97 IU/L (ref 39–117)
BILIRUBIN TOTAL: 0.4 mg/dL (ref 0.0–1.2)
BUN/Creatinine Ratio: 20 (ref 12–28)
BUN: 17 mg/dL (ref 8–27)
CHLORIDE: 104 mmol/L (ref 96–106)
CO2: 24 mmol/L (ref 20–29)
Calcium: 9 mg/dL (ref 8.7–10.3)
Creatinine, Ser: 0.84 mg/dL (ref 0.57–1.00)
GFR calc non Af Amer: 70 mL/min/{1.73_m2} (ref >59)
GFR, EST AFRICAN AMERICAN: 80 mL/min/{1.73_m2} (ref >59)
Globulin, Total: 2.1 g/dL (ref 1.5–4.5)
Glucose: 87 mg/dL (ref 65–99)
Potassium: 4.5 mmol/L (ref 3.5–5.2)
Sodium: 142 mmol/L (ref 134–144)
TOTAL PROTEIN: 6 g/dL (ref 6.0–8.5)

## 2016-10-26 LAB — LIPID PANEL
CHOLESTEROL TOTAL: 141 mg/dL (ref 100–199)
Chol/HDL Ratio: 2.7 ratio (ref 0.0–4.4)
HDL: 52 mg/dL (ref >39)
LDL CALC: 68 mg/dL (ref 0–99)
Triglycerides: 106 mg/dL (ref 0–149)
VLDL Cholesterol Cal: 21 mg/dL (ref 5–40)

## 2016-11-04 DIAGNOSIS — D485 Neoplasm of uncertain behavior of skin: Secondary | ICD-10-CM | POA: Diagnosis not present

## 2016-11-04 DIAGNOSIS — L723 Sebaceous cyst: Secondary | ICD-10-CM | POA: Diagnosis not present

## 2016-11-04 DIAGNOSIS — D2272 Melanocytic nevi of left lower limb, including hip: Secondary | ICD-10-CM | POA: Diagnosis not present

## 2016-11-04 DIAGNOSIS — D1801 Hemangioma of skin and subcutaneous tissue: Secondary | ICD-10-CM | POA: Diagnosis not present

## 2016-11-04 DIAGNOSIS — L821 Other seborrheic keratosis: Secondary | ICD-10-CM | POA: Diagnosis not present

## 2016-11-04 DIAGNOSIS — D235 Other benign neoplasm of skin of trunk: Secondary | ICD-10-CM | POA: Diagnosis not present

## 2016-11-04 DIAGNOSIS — L237 Allergic contact dermatitis due to plants, except food: Secondary | ICD-10-CM | POA: Diagnosis not present

## 2016-11-04 DIAGNOSIS — L820 Inflamed seborrheic keratosis: Secondary | ICD-10-CM | POA: Diagnosis not present

## 2016-11-06 ENCOUNTER — Other Ambulatory Visit: Payer: Self-pay | Admitting: Family Medicine

## 2016-11-10 ENCOUNTER — Other Ambulatory Visit: Payer: Self-pay | Admitting: Family Medicine

## 2016-11-13 ENCOUNTER — Other Ambulatory Visit: Payer: Self-pay | Admitting: Family Medicine

## 2016-11-13 NOTE — Telephone Encounter (Signed)
Please call in or fax refill of Lyrica 25mg  as approved.

## 2016-11-14 NOTE — Telephone Encounter (Signed)
Prescription called in

## 2016-11-24 ENCOUNTER — Encounter: Payer: Self-pay | Admitting: Family Medicine

## 2016-11-25 NOTE — Telephone Encounter (Signed)
error 

## 2016-12-03 ENCOUNTER — Ambulatory Visit (INDEPENDENT_AMBULATORY_CARE_PROVIDER_SITE_OTHER): Payer: Medicare Other | Admitting: Family Medicine

## 2016-12-03 ENCOUNTER — Ambulatory Visit (HOSPITAL_COMMUNITY): Payer: Self-pay

## 2016-12-03 ENCOUNTER — Encounter: Payer: Self-pay | Admitting: Family Medicine

## 2016-12-03 ENCOUNTER — Ambulatory Visit (INDEPENDENT_AMBULATORY_CARE_PROVIDER_SITE_OTHER): Payer: Medicare Other

## 2016-12-03 VITALS — BP 102/62 | HR 61 | Temp 97.7°F | Resp 16 | Ht 65.0 in | Wt 145.0 lb

## 2016-12-03 DIAGNOSIS — G834 Cauda equina syndrome: Secondary | ICD-10-CM

## 2016-12-03 DIAGNOSIS — F5104 Psychophysiologic insomnia: Secondary | ICD-10-CM | POA: Diagnosis not present

## 2016-12-03 DIAGNOSIS — R05 Cough: Secondary | ICD-10-CM | POA: Diagnosis not present

## 2016-12-03 DIAGNOSIS — R059 Cough, unspecified: Secondary | ICD-10-CM

## 2016-12-03 DIAGNOSIS — R410 Disorientation, unspecified: Secondary | ICD-10-CM

## 2016-12-03 DIAGNOSIS — Z23 Encounter for immunization: Secondary | ICD-10-CM | POA: Diagnosis not present

## 2016-12-03 DIAGNOSIS — W57XXXA Bitten or stung by nonvenomous insect and other nonvenomous arthropods, initial encounter: Secondary | ICD-10-CM

## 2016-12-03 DIAGNOSIS — Z1231 Encounter for screening mammogram for malignant neoplasm of breast: Secondary | ICD-10-CM | POA: Diagnosis not present

## 2016-12-03 DIAGNOSIS — I2510 Atherosclerotic heart disease of native coronary artery without angina pectoris: Secondary | ICD-10-CM

## 2016-12-03 DIAGNOSIS — R5383 Other fatigue: Secondary | ICD-10-CM

## 2016-12-03 NOTE — Progress Notes (Signed)
Subjective:    Patient ID: Kaylee Weiss, female    DOB: [birth year], 71 y.o.   MRN: [id number]  12/03/2016  Memory Loss (pt states she had an eposide last week were she did not remember who her husband was or the people in her house.) and Medication Reaction (pt states she is having a reaction to the cymbalta because every since she started the medication she has been having vived dreams )   HPI This 71 y.o. female presents for evaluation of altered mental status.    MyChart message sent to provider one week ago.   Having episodes where two hours after taking Lyrica, must take a nap. Will eat breakfast with husband and then moves to living room and will not remember moving from chair to sofa.  So hypersomnolent, sleeps through vacuuming or the phone ringing.  Started switching medications around.  Was taking Cymbalta 20mg  every morning and every night.  Then tried taking Cymbalta 40mg  every morning, suffers with nausea.  Provider stopped Lyrica 25mg  every morning with improvement in hypersomnolence.  Now really wants to sleep but unable to sleep. At night, dreams are so vivid.   Hypersomnolence started with Cymbalta addition; occurs every day at the same time.  Initially worried about a CVA when did not recall house or husband one morning when awakened suddenly.  Short duration of time when did not recognize anything.  Three weeks ago, stopped Ambien.  Now, taking Cymbalta every morning and Lyrica every night.  May wake up at 3:00am; will go asleep or will need to read for a bit. Also taking Melatonin at night.  One half hour after taking Lyrica, gets really nauseated.    Currently am pain free from two types of pain that pt has been suffering with for years.  This is really wonderful.    Expected to have nightmares from recent hearing but stopped watching the hearing.    Feels like has been infested with ticks.  Deer ticks and big ticks.  Two weeks ago, got a deer tick in place for two days along  lower back. Not sure if has an effect.  Certainly lacking energy. Making self walk one mile daily.  Dishes are waiting.  Not depressed; son and partner are moving to [facility].  Both children will be in [place].    Dry cough: worsening; onset 3-4 months ago.  Did not start with cold.  Thought was psychological cough.  Father died of lung cancer at age 65.  No allergy symptoms; mild heartburn.  Might be worse at night.    HA: since stopping Lyrica 25mg ; feels migrainish.  Tylenol does not help; Excedrin Migraine works well.    Bubbles: instead of pins and needles have turned into bubbles along RLE; assuming that back is a little worse.  When touches leg, feels RLE.  Sensation is worse.    BP Readings from Last 3 Encounters:  12/03/16 102/62  09/27/16 114/74  06/29/16 109/70   Wt Readings from Last 3 Encounters:  12/03/16 145 lb (65.8 kg)  06/29/16 143 lb (64.9 kg)  06/01/16 144 lb (65.3 kg)   Immunization History  Administered Date(s) Administered  . Influenza,inj,Quad PF,6+ Mos 11/07/2014, 12/07/2015, 12/03/2016  . Influenza-Unspecified 12/05/2013  . Pneumococcal Conjugate-13 03/07/2014  . Pneumococcal Polysaccharide-23 11/17/2015  . Pneumococcal-Unspecified 03/07/2010  . Td 02/12/2014  . Zoster 03/07/2005  . Zoster Recombinat (Shingrix) 07/15/2016    Review of Systems  Constitutional: Negative for chills, diaphoresis, fatigue and fever.  Eyes:  Negative for visual disturbance.  Respiratory: Negative for cough and shortness of breath.   Cardiovascular: Negative for chest pain, palpitations and leg swelling.  Gastrointestinal: Positive for constipation. Negative for abdominal distention, abdominal pain, anal bleeding, blood in stool, diarrhea, nausea, rectal pain and vomiting.  Endocrine: Negative for cold intolerance, heat intolerance, polydipsia, polyphagia and polyuria.  Genitourinary: Positive for difficulty urinating and frequency. Negative for decreased urine volume, dysuria, flank  pain, genital sores, hematuria, pelvic pain and urgency.  Musculoskeletal: Positive for back pain and myalgias.  Neurological: Positive for weakness. Negative for dizziness, tremors, seizures, syncope, facial asymmetry, speech difficulty, light-headedness, numbness and headaches.  Psychiatric/Behavioral: Positive for confusion and decreased concentration. Negative for dysphoric mood, self-injury and sleep disturbance. The patient is not nervous/anxious.     Past Medical History:  Diagnosis Date  . Anxiety   . Arthritis   . Cauda equina syndrome ([place])   . Cervical spinal stenosis   . Fibromyalgia   . Herpes labialis   . Hyperlipidemia   . Hypertension   . Lumbar spinal stenosis   . Macular degeneration, right eye   . Migraine   . PE (pulmonary thromboembolism) ([place])   . Pulmonary embolism ([place])    age 12; not on OCP.  Coumadin x 2 years.  . Retinal vein occlusion    age 43.  Marland Kitchen Spinal stenosis of lumbar region 12/16/2015   L3-4 level   Past Surgical History:  Procedure Laterality Date  . ABDOMINAL SURGERY    . EYE SURGERY    . HAND SURGERY Left   . ROTATOR CUFF REPAIR Right    Allergies  Allergen Reactions  . Demerol [Meperidine] Anaphylaxis  . Gabapentin Other (See Comments)  . Codeine     Other reaction(s): Other (See Comments)  . Other Other (See Comments)    Suicidal ideation Alcohol (drinking kind); takes liver enzymes and elevates them  . Percocet [Oxycodone-Acetaminophen] Nausea And Vomiting    Social History   Social History  . Marital status: Married    Spouse name: Herbie Baltimore  . Number of children: 2  . Years of education: RN   Occupational History  . 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 Smoking status: Never Smoker  . Smokeless tobacco: Never Used  . Alcohol use 0.0 oz/week     Comment: 15 cc/wk  . Drug use: No  . Sexual activity: No     Comment: Married   Other Topics Concern  . Not on file    Social History Narrative   Marital status:  Married x 47 years      Children: 2 children (49, 76); no granchildren      Lives: with husband, german shepard, kitkat/kitten      Employed: works once per week; has a Patent attorney one day per week; previous therapy practice in 2018.        Tobacco: never      Alcohol:  Socially; one glass of wine per week      Exercise:  Daily exercise; 3.0 miles per day      Advanced Directives:  FULL CODE; no prolonged measures      ADLs: independent with ADLs.        Right-handed   Caffeine: 2 cups of coffee per day         Family History  Problem Relation Age of Onset  . Heart attack Mother   .  Stroke Mother        CVA age 9; repeat at age 39 as cause of death.  . Diabetes Mother   . Heart disease Mother 37       recurrent AMIs  . Hyperlipidemia Mother   . Hypertension Mother   . Diabetes Father   . Heart attack Father   . Cancer Father 10       lung cancer  . Hyperlipidemia Father   . Hypertension Father   . Asthma Son        Objective:    BP 102/62   Pulse 61   Temp 97.7 F (36.5 C) (Oral)   Resp 16   Ht 5\' 5"  (1.651 m)   Wt 145 lb (65.8 kg)   SpO2 94%   BMI 24.13 kg/m  Physical Exam  Constitutional: She is oriented to person, place, and time. She appears well-developed and well-nourished. No distress.  HENT:  Head: Normocephalic and atraumatic.  Right Ear: External ear normal.  Left Ear: External ear normal.  Nose: Nose normal.  Mouth/Throat: Oropharynx is clear and moist.  Eyes: Pupils are equal, round, and reactive to light. Conjunctivae and EOM are normal.  Neck: Normal range of motion. Neck supple. Carotid bruit is not present. No thyromegaly present.  Cardiovascular: Normal rate, regular rhythm, normal heart sounds and intact distal pulses.  Exam reveals no gallop and no friction rub.   No murmur heard. Pulmonary/Chest: Effort normal and breath sounds normal. She has no wheezes. She has no rales.   Abdominal: Soft. Bowel sounds are normal. She exhibits no distension and no mass. There is no tenderness. There is no rebound and no guarding.  Lymphadenopathy:    She has no cervical adenopathy.  Neurological: She is alert and oriented to person, place, and time. No cranial nerve deficit. She exhibits normal muscle tone. Coordination normal.  Skin: Skin is warm and dry. No rash noted. She is not diaphoretic. No erythema. No pallor.  Well healing insect bite LEFT lower back.  2 mm surrounding erythema; no fluctuance or drainage.  Psychiatric: She has a normal mood and affect. Her behavior is normal. Judgment and thought content normal.    No results found. Depression screen [facility] 2/9 12/03/2016 09/27/2016 06/29/2016 06/01/2016 03/22/2016  Decreased Interest 0 0 0 0 0  Down, Depressed, Hopeless 0 0 0 0 0  PHQ - 2 Score 0 0 0 0 0   Fall Risk  12/03/2016 09/27/2016 06/29/2016 06/01/2016 03/22/2016  Falls in the past year? Yes No No No No  Comment - - - - -  Number falls in past yr: 1 - - - -  Injury with Fall? No - - - -  Comment - - - - -  Risk Factor Category  - - - - -  Risk for fall due to : - - - - -  Risk for fall due to: Comment - - - - -        Assessment & Plan:   1. Disorientation   2. Need for prophylactic vaccination and inoculation against influenza   3. Cauda equina syndrome ([place])   4. Cough   5. Other fatigue   6. Tick bite, initial encounter   7. Psychophysiological insomnia   8. Encounter for screening mammogram for breast cancer    -new onset disorientation last week of short duration less than five minutes; occurred during time when patient usually hypersomnolent from Lyrica dose.  Normal neurological exam in office today;  no focal neurological symptoms associated with acute confusion.  Obtain labs.  No recurrence with discontinuation of Lyrica 25mg  every morning.  Pt also discontinued Ambien use three weeks ago per my recommendation.  If recurs, will warrant MRI brain to  rule out CVA or mass.   -also recommend pt decrease Lyrica to 75mg  qhs (from 100mg ). -continue Cymbalta 40mg  every morning. -new onset cough; CXR obtained and normal; onset three months ago; pt feels psychological because father died at age 8 of lung cancer.  Cough started the month of father's death.  If persists, will need to treat allergic rhinitis and/or GERD; pt denies any symptoms at this time. -new onset fatigue; obtain labs; most consistent with medication side effect; decrease Lyrica dose further; recent discontinuation of Ambien as well. -recent tick bites; doubt RMSF or Lymes disease contributing to current symptoms.  Obtain labs.   -word finding difficulties have worsened lately; s/p neurology consultation in the past year; likely also worsening due to medications.  Normal interactions and word finding during visit; pt reports occurs when fatigued. -prolonged face-to-face for 40 minutes with greater than 50% of time dedicated to counseling and coordination of care.   Orders Placed This Encounter  Procedures  . Urine Culture  . MM DIGITAL SCREENING BILATERAL    Standing Status:   Future    Standing Expiration Date:   02/02/2018    Order Specific Question:   Reason for Exam (SYMPTOM  OR DIAGNOSIS REQUIRED)    Answer:   screening for breast cancer    Order Specific Question:   Preferred imaging location?    Answer:   [facility]  . DG Chest 2 View    Standing Status:   Future    Number of Occurrences:   1    Standing Expiration Date:   12/03/2017    Order Specific Question:   Reason for Exam (SYMPTOM  OR DIAGNOSIS REQUIRED)    Answer:   cough for four months    Order Specific Question:   Preferred imaging location?    Answer:   External  . Flu Vaccine QUAD 36+ mos IM  . CBC with Differential/Platelet  . Comprehensive metabolic panel  . RPR  . TSH  . Rickettsial Fever Group IgG/M  . POCT urinalysis dipstick  . POCT Microscopic Urinalysis ([facility])   No orders of the  defined types were placed in this encounter.   No Follow-up on file.   Chinonso Linker Elayne Guerin, M.D. Primary Care at [facility] previously Ur[facility] [place] [address] [place], [place]  [address] [phone] phone [phone] fax

## 2016-12-03 NOTE — Patient Instructions (Addendum)
   DECREASE LYRICA TO 75MG  AT BEDTIME.  IF you received an x-ray today, you will receive an invoice from [facility]. Please contact [facility] at [phone] with questions or concerns regarding your invoice.   IF you received labwork today, you will receive an invoice from [facility]. Please contact [facility] at [phone] with questions or concerns regarding your invoice.   Our billing staff will not be able to assist you with questions regarding bills from these companies.  You will be contacted with the lab results as soon as they are available. The fastest way to get your results is to activate your My Chart account. Instructions are located on the last page of this paperwork. If you have not heard from [place] regarding the results in 2 weeks, please contact this office.

## 2016-12-06 LAB — CBC WITH DIFFERENTIAL/PLATELET
BASOS: 0 %
Basophils Absolute: 0 10*3/uL (ref 0.0–0.2)
EOS (ABSOLUTE): 0.2 10*3/uL (ref 0.0–0.4)
Eos: 3 %
HEMATOCRIT: 44.8 % (ref 34.0–46.6)
Hemoglobin: 14.7 g/dL (ref 11.1–15.9)
IMMATURE GRANULOCYTES: 0 %
Immature Grans (Abs): 0 10*3/uL (ref 0.0–0.1)
LYMPHS ABS: 2.1 10*3/uL (ref 0.7–3.1)
Lymphs: 29 %
MCH: 31 pg (ref 26.6–33.0)
MCHC: 32.8 g/dL (ref 31.5–35.7)
MCV: 95 fL (ref 79–97)
MONOS ABS: 0.8 10*3/uL (ref 0.1–0.9)
Monocytes: 11 %
Neutrophils Absolute: 4.1 10*3/uL (ref 1.4–7.0)
Neutrophils: 57 %
PLATELETS: 225 10*3/uL (ref 150–379)
RBC: 4.74 x10E6/uL (ref 3.77–5.28)
RDW: 13.3 % (ref 12.3–15.4)
WBC: 7.2 10*3/uL (ref 3.4–10.8)

## 2016-12-06 LAB — RICKETTSIAL FEVER GROUP IGG/M
Spotted Fever Group IgM: <1:64 {titer}
Typhus Fever Group IgG: <1:64 {titer}

## 2016-12-06 LAB — COMPREHENSIVE METABOLIC PANEL
A/G RATIO: 1.7 (ref 1.2–2.2)
ALK PHOS: 101 IU/L (ref 39–117)
ALT: 36 IU/L — AB (ref 0–32)
AST: 36 IU/L (ref 0–40)
Albumin: 3.9 g/dL (ref 3.5–4.8)
BILIRUBIN TOTAL: 0.7 mg/dL (ref 0.0–1.2)
BUN/Creatinine Ratio: 22 (ref 12–28)
BUN: 20 mg/dL (ref 8–27)
CALCIUM: 9.4 mg/dL (ref 8.7–10.3)
CHLORIDE: 105 mmol/L (ref 96–106)
CO2: 24 mmol/L (ref 20–29)
Creatinine, Ser: 0.9 mg/dL (ref 0.57–1.00)
GFR calc Af Amer: 74 mL/min/{1.73_m2} (ref >59)
GFR, EST NON AFRICAN AMERICAN: 64 mL/min/{1.73_m2} (ref >59)
GLOBULIN, TOTAL: 2.3 g/dL (ref 1.5–4.5)
Glucose: 95 mg/dL (ref 65–99)
POTASSIUM: 4.8 mmol/L (ref 3.5–5.2)
SODIUM: 140 mmol/L (ref 134–144)
Total Protein: 6.2 g/dL (ref 6.0–8.5)

## 2016-12-06 LAB — RPR: RPR: NONREACTIVE

## 2016-12-06 LAB — TSH: TSH: 1.81 u[IU]/mL (ref 0.450–4.500)

## 2016-12-08 ENCOUNTER — Encounter: Payer: Self-pay | Admitting: Family Medicine

## 2016-12-17 ENCOUNTER — Other Ambulatory Visit: Payer: Self-pay | Admitting: *Deleted

## 2016-12-17 ENCOUNTER — Encounter: Payer: Self-pay | Admitting: Family Medicine

## 2016-12-17 ENCOUNTER — Ambulatory Visit (INDEPENDENT_AMBULATORY_CARE_PROVIDER_SITE_OTHER): Payer: Medicare Other | Admitting: Family Medicine

## 2016-12-17 VITALS — BP 116/78 | HR 54 | Resp 16 | Ht 65.0 in

## 2016-12-17 DIAGNOSIS — I2510 Atherosclerotic heart disease of native coronary artery without angina pectoris: Secondary | ICD-10-CM | POA: Diagnosis not present

## 2016-12-17 DIAGNOSIS — M5441 Lumbago with sciatica, right side: Secondary | ICD-10-CM | POA: Diagnosis not present

## 2016-12-17 DIAGNOSIS — M48061 Spinal stenosis, lumbar region without neurogenic claudication: Secondary | ICD-10-CM | POA: Diagnosis not present

## 2016-12-17 DIAGNOSIS — R413 Other amnesia: Secondary | ICD-10-CM

## 2016-12-17 DIAGNOSIS — G834 Cauda equina syndrome: Secondary | ICD-10-CM | POA: Diagnosis not present

## 2016-12-17 DIAGNOSIS — F5104 Psychophysiologic insomnia: Secondary | ICD-10-CM | POA: Diagnosis not present

## 2016-12-17 DIAGNOSIS — N390 Urinary tract infection, site not specified: Secondary | ICD-10-CM

## 2016-12-17 DIAGNOSIS — G8929 Other chronic pain: Secondary | ICD-10-CM | POA: Diagnosis not present

## 2016-12-17 LAB — POCT URINALYSIS DIP (MANUAL ENTRY)
BILIRUBIN UA: NEGATIVE
BILIRUBIN UA: NEGATIVE mg/dL
Blood, UA: NEGATIVE
GLUCOSE UA: NEGATIVE mg/dL
LEUKOCYTES UA: NEGATIVE
Nitrite, UA: NEGATIVE
PROTEIN UA: NEGATIVE mg/dL
Urobilinogen, UA: 0.2 E.U./dL
pH, UA: 6 (ref 5.0–8.0)

## 2016-12-17 MED ORDER — OXYCODONE-ACETAMINOPHEN 5-325 MG PO TABS: 1.0000 | ORAL_TABLET | Freq: Four times a day (QID) | ORAL | 0 refills | Status: DC | PRN

## 2016-12-17 NOTE — Patient Instructions (Signed)
     IF you received an x-ray today, you will receive an invoice from [facility]. Please contact [facility] at [phone] with questions or concerns regarding your invoice.   IF you received labwork today, you will receive an invoice from [facility]. Please contact [facility] at [phone] with questions or concerns regarding your invoice.   Our billing staff will not be able to assist you with questions regarding bills from these companies.  You will be contacted with the lab results as soon as they are available. The fastest way to get your results is to activate your My Chart account. Instructions are located on the last page of this paperwork. If you have not heard from us regarding the results in 2 weeks, please contact this office.     

## 2016-12-17 NOTE — Progress Notes (Signed)
Subjective:    Patient ID: Kaylee Weiss, female    DOB: 08-Apr-1945, 71 y.o.   MRN: [id number]  12/17/2016  Leg Pain (since medication was decreased pain level has increased to 5-6 and need new Rx for lyrica 75mg , also restarted Lorrin Mais )    HPI This 71 y.o. female presents for evaluation of worsening lower back pain.  Episodes of mental confusion are gone with decrease in Lyrica dose; appears secondary to Lyrica with Cymbalta.  Pain has worsened.   Decreased Lyrica to 75mg  daily at last visit due to amnesia every morning/mental confusion for two hours. Continues to walk with friends.  Now has a ten pound puppy.  Only eight weeks old.  [place]; [place].   Had been talking with husband about getting another dog; Rudi Rummage was a failed therapy dog.  No walking dog.  [employer] mut.  Comes up to his knees.    Last injections January 2018.  Considering injection.  Has tried Lidoderm patches. Knee pain RIGHT is worst pain.  Getting knee pain every night.   Pain is worsened when on feet prolonged; standing is the worst for back. Knows RIGHT knee pain is related to DDD lumbar spine.   Injection of lower back really helped knee pain.  Local orthopedist/[facility].     BP Readings from Last 3 Encounters:  12/17/16 116/78  12/03/16 102/62  09/27/16 114/74   Wt Readings from Last 3 Encounters:  12/03/16 145 lb (65.8 kg)  06/29/16 143 lb (64.9 kg)  06/01/16 144 lb (65.3 kg)   Immunization History  Administered Date(s) Administered  . Influenza,inj,Quad PF,6+ Mos 11/07/2014, 12/07/2015, 12/03/2016  . Influenza-Unspecified 12/05/2013  . Pneumococcal Conjugate-13 03/07/2014  . Pneumococcal Polysaccharide-23 11/17/2015  . Pneumococcal-Unspecified 03/07/2010  . Td 02/12/2014  . Zoster 03/07/2005  . Zoster Recombinat (Shingrix) 07/15/2016    Review of Systems  Constitutional: Negative for chills, diaphoresis, fatigue and fever.    Eyes: Negative for visual disturbance.  Respiratory: Negative for cough and shortness of breath.   Cardiovascular: Negative for chest pain, palpitations and leg swelling.  Gastrointestinal: Negative for abdominal pain, constipation, diarrhea, nausea and vomiting.  Endocrine: Negative for cold intolerance, heat intolerance, polydipsia, polyphagia and polyuria.  Genitourinary: Positive for difficulty urinating.  Musculoskeletal: Positive for back pain and myalgias.  Neurological: Positive for weakness. Negative for dizziness, tremors, seizures, syncope, facial asymmetry, speech difficulty, light-headedness, numbness and headaches.  Psychiatric/Behavioral: Positive for sleep disturbance. Negative for agitation, behavioral problems, confusion, decreased concentration, dysphoric mood, hallucinations and self-injury. The patient is not nervous/anxious and is not hyperactive.     Past Medical History:  Diagnosis Date  . Anxiety   . Arthritis   . Cauda equina syndrome ([place])   . Cervical spinal stenosis   . Fibromyalgia   . Herpes labialis   . Hyperlipidemia   . Hypertension   . Lumbar spinal stenosis   . Macular degeneration, right eye   . Migraine   . PE (pulmonary thromboembolism) ([place])   . Pulmonary embolism ([place])    age 23; not on OCP.  Coumadin x 2 years.  . Retinal vein occlusion    age 70.  Marland Kitchen Spinal stenosis of lumbar region 12/16/2015   L3-4 level   Past Surgical History:  Procedure Laterality Date  . ABDOMINAL SURGERY    . EYE SURGERY    . HAND SURGERY Left   . ROTATOR CUFF REPAIR Right  Allergies  Allergen Reactions  . Demerol [Meperidine] Anaphylaxis  . Gabapentin Other (See Comments)  . Codeine     Other reaction(s): Other (See Comments)  . Other Other (See Comments)    Suicidal ideation Alcohol (drinking kind); takes liver enzymes and elevates them  . Percocet [Oxycodone-Acetaminophen] Nausea And Vomiting   Current Outpatient Prescriptions on File Prior to  Visit  Medication Sig Dispense Refill  . acyclovir ointment (ZOVIRAX) 5 % Apply topically.    Marland Kitchen aspirin 81 MG tablet Take 81 mg by mouth daily.    Marland Kitchen atorvastatin (LIPITOR) 10 MG tablet Take 1 tablet (10 mg total) by mouth daily. 90 tablet 3  . buPROPion (WELLBUTRIN XL) 150 MG 24 hr tablet Take 1 tablet (150 mg total) by mouth daily. 90 tablet 3  . celecoxib (CELEBREX) 200 MG capsule Take 1 capsule (200 mg total) by mouth 2 (two) times daily. 180 capsule 3  . DULoxetine (CYMBALTA) 20 MG capsule Take 2 capsules (40 mg total) by mouth daily. 371 capsule 1  . folic acid (FOLVITE) 1 MG tablet Take 1 tablet (1 mg total) by mouth daily. 90 tablet 3  . losartan (COZAAR) 25 MG tablet Take 1 tablet (25 mg total) by mouth daily. 90 tablet 3  . LYRICA 100 MG capsule take 1 capsule by mouth at bedtime 90 capsule 1  . metoprolol (TOPROL-XL) 200 MG 24 hr tablet Take 1 tablet (200 mg total) by mouth daily. 90 tablet 3  . OVER THE COUNTER MEDICATION OTC B6 100 mgm taking daily    . OVER THE COUNTER MEDICATION OTC preservision areds taking 1 in the am/qhs    . OVER THE COUNTER MEDICATION OTC B12-SL taking everyday    . OVER THE COUNTER MEDICATION Reported on 04/17/2015    . tolterodine (DETROL LA) 2 MG 24 hr capsule Take 1 capsule (2 mg total) by mouth daily. 90 capsule 3  . UNABLE TO FIND Uses Lidocaine topical cream @@ night    . valACYclovir (VALTREX) 500 MG tablet Take 1 tablet (500 mg total) by mouth 2 (two) times daily. 180 tablet 3   No current facility-administered medications on file prior to visit.    Social History   Social History  . Marital status: Married    Spouse name: Herbie Baltimore  . Number of children: 2  . Years of education: RN   Occupational History  . 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 Smoking status: Never Smoker  . Smokeless tobacco: Never Used  . Alcohol use 0.0 oz/week     Comment: 15 cc/wk  . Drug use: No  . Sexual activity:  No     Comment: Married   Other Topics Concern  . Not on file   Social History Narrative   Marital status:  Married x 47 years      Children: 2 children (49, 29); no granchildren      Lives: with husband, german shepard, kitkat/kitten      Employed: works once per week; has a Patent attorney one day per week; previous therapy practice in 2018.        Tobacco: never      Alcohol:  Socially; one glass of wine per week      Exercise:  Daily exercise; 3.0 miles per day      Advanced Directives:  FULL CODE; no prolonged measures      ADLs: independent with  ADLs.        Right-handed   Caffeine: 2 cups of coffee per day         Family History  Problem Relation Age of Onset  . Heart attack Mother   . Stroke Mother        CVA age 74; repeat at age 2 as cause of death.  . Diabetes Mother   . Heart disease Mother 44       recurrent AMIs  . Hyperlipidemia Mother   . Hypertension Mother   . Diabetes Father   . Heart attack Father   . Cancer Father 9       lung cancer  . Hyperlipidemia Father   . Hypertension Father   . Asthma Son        Objective:    BP 116/78   Pulse (!) 54   Resp 16   Ht 5\' 5"  (1.651 m)   SpO2 96%  Physical Exam  Constitutional: She is oriented to person, place, and time. She appears well-developed and well-nourished. No distress.  HENT:  Head: Normocephalic and atraumatic.  Eyes: Pupils are equal, round, and reactive to light. Conjunctivae and EOM are normal.  Neck: Normal range of motion. Neck supple. Carotid bruit is not present. No thyromegaly present.  Cardiovascular: Normal rate, regular rhythm, normal heart sounds and intact distal pulses.  Exam reveals no gallop and no friction rub.   No murmur heard. Pulmonary/Chest: Effort normal and breath sounds normal. She has no wheezes. She has no rales.  Lymphadenopathy:    She has no cervical adenopathy.  Neurological: She is alert and oriented to person, place, and time. No cranial nerve deficit.  She exhibits normal muscle tone. Coordination normal.  Skin: Skin is warm and dry. No rash noted. She is not diaphoretic. No erythema. No pallor.  Psychiatric: She has a normal mood and affect. Her behavior is normal. Judgment and thought content normal.   No results found. Depression screen [facility] 2/9 12/17/2016 12/03/2016 09/27/2016 06/29/2016 06/01/2016  Decreased Interest 0 0 0 0 0  Down, Depressed, Hopeless 0 0 0 0 0  PHQ - 2 Score 0 0 0 0 0   Fall Risk  12/17/2016 12/03/2016 09/27/2016 06/29/2016 06/01/2016  Falls in the past year? No Yes No No No  Comment - - - - -  Number falls in past yr: - 1 - - -  Injury with Fall? - No - - -  Comment - - - - -  Risk Factor Category  - - - - -  Risk for fall due to : - - - - -  Risk for fall due to: Comment - - - - -        Assessment & Plan:   1. Cauda equina syndrome ([facility])   2. Spinal stenosis of lumbar region without neurogenic claudication   3. Recurrent UTI   4. Chronic bilateral low back pain with right-sided sciatica   5. Psychophysiological insomnia   6. Memory change     -worsening chronic pain due to decrease in Lyrica to 75mg  daily; increase Lyrica to 100mg  daily with acknowledgement and acceptance that will suffer with hypersomnia for two hours in the morning yet with much improved pain control.  -refill of oxycodone provided for severe pain. -refer to local orthopedic group for future injections for pain control. -prolonged face-to-face for 25 minutes with greater than 50% of time dedicated to counseling and coordination of care.    Orders Placed This  Encounter  Procedures  . AMB referral to orthopedics    Referral Priority:   Routine    Referral Type:   Consultation    Number of Visits Requested:   1   Meds ordered this encounter  Medications  . oxyCODONE-acetaminophen (PERCOCET/ROXICET) 5-325 MG tablet    Sig: Take 1 tablet by mouth every 6 (six) hours as needed for severe pain.    Dispense:  30 tablet    Refill:  0      No Follow-up on file.   Jaquise Faux Elayne Guerin, M.D. Primary Care at [facility] previously Ur[facility] [place] [address] [place],   [address] [phone] phone [phone] fax

## 2016-12-18 LAB — URINE CULTURE

## 2016-12-26 ENCOUNTER — Other Ambulatory Visit: Payer: Self-pay | Admitting: Family Medicine

## 2016-12-26 DIAGNOSIS — Z1231 Encounter for screening mammogram for malignant neoplasm of breast: Secondary | ICD-10-CM

## 2017-01-03 ENCOUNTER — Ambulatory Visit: Payer: Medicare Other | Admitting: Family Medicine

## 2017-01-09 ENCOUNTER — Ambulatory Visit (INDEPENDENT_AMBULATORY_CARE_PROVIDER_SITE_OTHER): Payer: Medicare Other | Admitting: Family Medicine

## 2017-01-09 ENCOUNTER — Encounter: Payer: Self-pay | Admitting: Family Medicine

## 2017-01-09 VITALS — BP 102/62 | HR 69 | Temp 97.9°F | Resp 16 | Ht 65.35 in

## 2017-01-09 DIAGNOSIS — M5441 Lumbago with sciatica, right side: Secondary | ICD-10-CM | POA: Diagnosis not present

## 2017-01-09 DIAGNOSIS — R2681 Unsteadiness on feet: Secondary | ICD-10-CM

## 2017-01-09 DIAGNOSIS — I2510 Atherosclerotic heart disease of native coronary artery without angina pectoris: Secondary | ICD-10-CM | POA: Diagnosis not present

## 2017-01-09 DIAGNOSIS — G834 Cauda equina syndrome: Secondary | ICD-10-CM

## 2017-01-09 DIAGNOSIS — G8929 Other chronic pain: Secondary | ICD-10-CM | POA: Diagnosis not present

## 2017-01-09 DIAGNOSIS — M48061 Spinal stenosis, lumbar region without neurogenic claudication: Secondary | ICD-10-CM

## 2017-01-09 DIAGNOSIS — M47817 Spondylosis without myelopathy or radiculopathy, lumbosacral region: Secondary | ICD-10-CM

## 2017-01-09 NOTE — Progress Notes (Signed)
Subjective:    Patient ID: Kaylee Weiss, female    DOB: 1945/10/06, 71 y.o.   MRN: [id number]  01/09/2017  Chronic Conditions (follow-up)    HPI This 71 y.o. female presents for evaluation of chronic lower back pain and recent medication adjustments.   Increased Lyrica to 100mg  qhs; taking Cymbalta in morning.  No longer having morning hypersomnolence.  No longer needing to take a 1.5 hour nap.   Having lots of bubbles along leg; not numb but it is bubbling.  Strange feeling.   Sees [facility] next week.  Took height to 5'5".  Was 5'7.5".   R knee pain less intense; having lower back pain; really dependent on activity through the day; has a puppy; ten weeks old right now; fights with german shephard.  Potty trained because shephard takes her out. Picks on the cat.  Pt would like better pain control.   Unstable and likely to fall backwards.  Has been on a ladder.  Daughter wants to put everything away so she does not need to deal with it.   Son is moving to [facility].  Good life.    Headache:onset a couple of weeks ago.  Then gets one in the morning.  Having one right now.  Maybe from giving blood yesterday.  Wakes up with headache nearly every morning; has coffee in morning; resolves on own. Sometimes has all day.  Sometimes gets in afternoon.  Severity 2/10.  Taking Ambien 2.5mg  qhs.   Going for mammogram this week.    BP Readings from Last 3 Encounters:  01/09/17 102/62  12/17/16 116/78  12/03/16 102/62   Wt Readings from Last 3 Encounters:  12/03/16 145 lb (65.8 kg)  06/29/16 143 lb (64.9 kg)  06/01/16 144 lb (65.3 kg)   Immunization History  Administered Date(s) Administered  . Influenza,inj,Quad PF,6+ Mos 11/07/2014, 12/07/2015, 12/03/2016  . Influenza-Unspecified 12/05/2013  . Pneumococcal Conjugate-13 03/07/2014  . Pneumococcal Polysaccharide-23 11/17/2015  . Pneumococcal-Unspecified 03/07/2010  . Td 02/12/2014  . Zoster 03/07/2005  . Zoster Recombinat (Shingrix) 07/15/2016      Review of Systems  Constitutional: Negative for chills, diaphoresis, fatigue and fever.  Eyes: Negative for visual disturbance.  Respiratory: Negative for cough and shortness of breath.   Cardiovascular: Negative for chest pain, palpitations and leg swelling.  Gastrointestinal: Negative for abdominal distention, abdominal pain, anal bleeding, blood in stool, constipation, diarrhea, nausea and vomiting.  Endocrine: Negative for cold intolerance, heat intolerance, polydipsia, polyphagia and polyuria.  Musculoskeletal: Positive for back pain and gait problem.  Neurological: Positive for numbness. Negative for dizziness, tremors, seizures, syncope, facial asymmetry, speech difficulty, weakness, light-headedness and headaches.    Past Medical History:  Diagnosis Date  . Anxiety   . Arthritis   . Cauda equina syndrome ([place])   . Cervical spinal stenosis   . Fibromyalgia   . Herpes labialis   . Hyperlipidemia   . Hypertension   . Lumbar spinal stenosis   . Macular degeneration, right eye   . Migraine   . PE (pulmonary thromboembolism) ([place])   . Pulmonary embolism ([place])    age 40; not on OCP.  Coumadin x 2 years.  . Retinal vein occlusion    age 52.  Marland Kitchen Spinal stenosis of lumbar region 12/16/2015   L3-4 level   Past Surgical History:  Procedure Laterality Date  . ABDOMINAL SURGERY    . EYE SURGERY    . HAND SURGERY Left   . ROTATOR CUFF REPAIR Right  Allergies  Allergen Reactions  . Demerol [Meperidine] Anaphylaxis  . Gabapentin Other (See Comments)  . Codeine     Other reaction(s): Other (See Comments)  . Other Other (See Comments)    Suicidal ideation Alcohol (drinking kind); takes liver enzymes and elevates them  . Percocet [Oxycodone-Acetaminophen] Nausea And Vomiting   Current Outpatient Medications on File Prior to Visit  Medication Sig Dispense Refill  . acyclovir ointment (ZOVIRAX) 5 % Apply topically.    Marland Kitchen aspirin 81 MG tablet Take 81 mg by mouth daily.     Marland Kitchen atorvastatin (LIPITOR) 10 MG tablet Take 1 tablet (10 mg total) by mouth daily. 90 tablet 3  . buPROPion (WELLBUTRIN XL) 150 MG 24 hr tablet Take 1 tablet (150 mg total) by mouth daily. 90 tablet 3  . celecoxib (CELEBREX) 200 MG capsule Take 1 capsule (200 mg total) by mouth 2 (two) times daily. 180 capsule 3  . DULoxetine (CYMBALTA) 20 MG capsule Take 2 capsules (40 mg total) by mouth daily. 765 capsule 1  . folic acid (FOLVITE) 1 MG tablet Take 1 tablet (1 mg total) by mouth daily. 90 tablet 3  . losartan (COZAAR) 25 MG tablet Take 1 tablet (25 mg total) by mouth daily. 90 tablet 3  . LYRICA 100 MG capsule take 1 capsule by mouth at bedtime 90 capsule 1  . metoprolol (TOPROL-XL) 200 MG 24 hr tablet Take 1 tablet (200 mg total) by mouth daily. 90 tablet 3  . OVER THE COUNTER MEDICATION OTC B6 100 mgm taking daily    . OVER THE COUNTER MEDICATION OTC preservision areds taking 1 in the am/qhs    . OVER THE COUNTER MEDICATION OTC B12-SL taking everyday    . OVER THE COUNTER MEDICATION Reported on 04/17/2015    . oxyCODONE-acetaminophen (PERCOCET/ROXICET) 5-325 MG tablet Take 1 tablet by mouth every 6 (six) hours as needed for severe pain. 30 tablet 0  . tolterodine (DETROL LA) 2 MG 24 hr capsule Take 1 capsule (2 mg total) by mouth daily. 90 capsule 3  . UNABLE TO FIND Uses Lidocaine topical cream @@ night    . valACYclovir (VALTREX) 500 MG tablet Take 1 tablet (500 mg total) by mouth 2 (two) times daily. 180 tablet 3   No current facility-administered medications on file prior to visit.    Social History   Socioeconomic History  . Marital status: Married    Spouse name: Herbie Baltimore  . Number of children: 2  . Years of education: Therapist, sports  . Highest education level: Not on file  Social Needs  . Financial resource strain: Not on file  . Food insecurity - worry: Not on file  . Food insecurity - inability: Not on file  . Transportation needs - medical: Not on file  . Transportation needs -  non-medical: Not on file  Occupational History  . 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 Smoking status: Never Smoker  . Smokeless tobacco: Never Used  Substance and Sexual Activity  . Alcohol use: Yes    Alcohol/week: 0.0 oz    Comment: 15 cc/wk  . Drug use: No  . Sexual activity: No    Partners: Male    Comment: Married  Other Topics Concern  . Not on file  Social History Narrative   Marital status:  Married x 47 years      Children: 2 children (49, 45); no granchildren  Lives: with husband, german shepard, kitkat/kitten      Employed: works once per week; has a Patent attorney one day per week; previous therapy practice in 2018.        Tobacco: never      Alcohol:  Socially; one glass of wine per week      Exercise:  Daily exercise; 3.0 miles per day      Advanced Directives:  FULL CODE; no prolonged measures      ADLs: independent with ADLs.        Right-handed   Caffeine: 2 cups of coffee per day         Family History  Problem Relation Age of Onset  . Heart attack Mother   . Stroke Mother        CVA age 55; repeat at age 43 as cause of death.  . Diabetes Mother   . Heart disease Mother 74       recurrent AMIs  . Hyperlipidemia Mother   . Hypertension Mother   . Diabetes Father   . Heart attack Father   . Cancer Father 59       lung cancer  . Hyperlipidemia Father   . Hypertension Father   . Asthma Son        Objective:    BP 102/62   Pulse 69   Temp 97.9 F (36.6 C) (Oral)   Resp 16   Ht 5' 5.35" (1.66 m)   SpO2 96%   BMI 23.87 kg/m  Physical Exam  Constitutional: She is oriented to person, place, and time. She appears well-developed and well-nourished. No distress.  HENT:  Head: Normocephalic and atraumatic.  Right Ear: External ear normal.  Left Ear: External ear normal.  Nose: Nose normal.  Mouth/Throat: Oropharynx is clear and moist.  Eyes: Conjunctivae and EOM are normal. Pupils  are equal, round, and reactive to light.  Neck: Normal range of motion. Neck supple. Carotid bruit is not present. No thyromegaly present.  Cardiovascular: Normal rate, regular rhythm, normal heart sounds and intact distal pulses. Exam reveals no gallop and no friction rub.  No murmur heard. Pulmonary/Chest: Effort normal and breath sounds normal. She has no wheezes. She has no rales.  Abdominal: Soft. Bowel sounds are normal. She exhibits no distension and no mass. There is no tenderness. There is no rebound and no guarding.  Lymphadenopathy:    She has no cervical adenopathy.  Neurological: She is alert and oriented to person, place, and time. No cranial nerve deficit.  Skin: Skin is warm and dry. No rash noted. She is not diaphoretic. No erythema. No pallor.  Psychiatric: She has a normal mood and affect. Her behavior is normal.   No results found. Depression screen [facility] 2/9 01/09/2017 12/17/2016 12/03/2016 09/27/2016 06/29/2016  Decreased Interest 0 0 0 0 0  Down, Depressed, Hopeless 0 0 0 0 0  PHQ - 2 Score 0 0 0 0 0   Fall Risk  01/09/2017 12/17/2016 12/03/2016 09/27/2016 06/29/2016  Falls in the past year? No No Yes No No  Comment - - - - -  Number falls in past yr: - - 1 - -  Injury with Fall? - - No - -  Comment - - - - -  Risk Factor Category  - - - - -  Risk for fall due to : - - - - -  Risk for fall due to: Comment - - - - -  Assessment & Plan:   1. Lumbar and sacral osteoarthritis   2. Cauda equina syndrome ([place])   3. Chronic bilateral low back pain with right-sided sciatica   4. Unstable gait   5. Spinal stenosis of lumbar region without neurogenic claudication     Chronic lower back pain with radicular symptoms and known cauda equina syndrome.  Moderate pain controlled with current regimen and patient desires improved pain management.  Current dose of Lyrica and Cymbalta tolerable with less neuropsych side effects.  Undergoing consultation by Weston Anna next  week.  Would like to receive injections in lumbar spine locally.  Continue with current medical regimen at this time. Remains physically active and doing well.  I would avoid climbing ladders and ask for more assistance from family members.   No orders of the defined types were placed in this encounter.  No orders of the defined types were placed in this encounter.   Return in about 4 weeks (around 02/06/2017) for follow-up chronic medical conditions.   Zayveon Raschke Elayne Guerin, M.D. Primary Care at [facility] previously [facility] [place] [address] [place], [place]  [address] [phone] phone [phone] fax

## 2017-01-09 NOTE — Patient Instructions (Signed)
     IF you received an x-ray today, you will receive an invoice from [facility]. Please contact [facility] at [phone] with questions or concerns regarding your invoice.   IF you received labwork today, you will receive an invoice from [facility]. Please contact [facility] at [phone] with questions or concerns regarding your invoice.   Our billing staff will not be able to assist you with questions regarding bills from these companies.  You will be contacted with the lab results as soon as they are available. The fastest way to get your results is to activate your My Chart account. Instructions are located on the last page of this paperwork. If you have not heard from us regarding the results in 2 weeks, please contact this office.     

## 2017-01-18 ENCOUNTER — Ambulatory Visit
Admission: RE | Admit: 2017-01-18 | Discharge: 2017-01-18 | Disposition: A | Discharge status: Home / Self Care | Payer: Medicare Other | Source: Ambulatory Visit | Attending: Family Medicine | Admitting: Family Medicine

## 2017-01-18 DIAGNOSIS — Z1231 Encounter for screening mammogram for malignant neoplasm of breast: Secondary | ICD-10-CM | POA: Diagnosis not present

## 2017-01-19 DIAGNOSIS — M545 Low back pain: Secondary | ICD-10-CM | POA: Diagnosis not present

## 2017-01-19 DIAGNOSIS — G834 Cauda equina syndrome: Secondary | ICD-10-CM | POA: Diagnosis not present

## 2017-01-19 DIAGNOSIS — M5416 Radiculopathy, lumbar region: Secondary | ICD-10-CM | POA: Diagnosis not present

## 2017-01-23 DIAGNOSIS — M545 Low back pain: Secondary | ICD-10-CM | POA: Diagnosis not present

## 2017-02-13 ENCOUNTER — Ambulatory Visit: Payer: Medicare Other | Admitting: Family Medicine

## 2017-02-13 IMAGING — CR DG ANKLE COMPLETE 3+V*L*
4 series · 4 of 4 positions shown · non-contrast
Comparison: None.

CLINICAL DATA: Left ankle pain status post tripping.

EXAM:
LEFT ANKLE COMPLETE - 3+ VIEW

[AP]
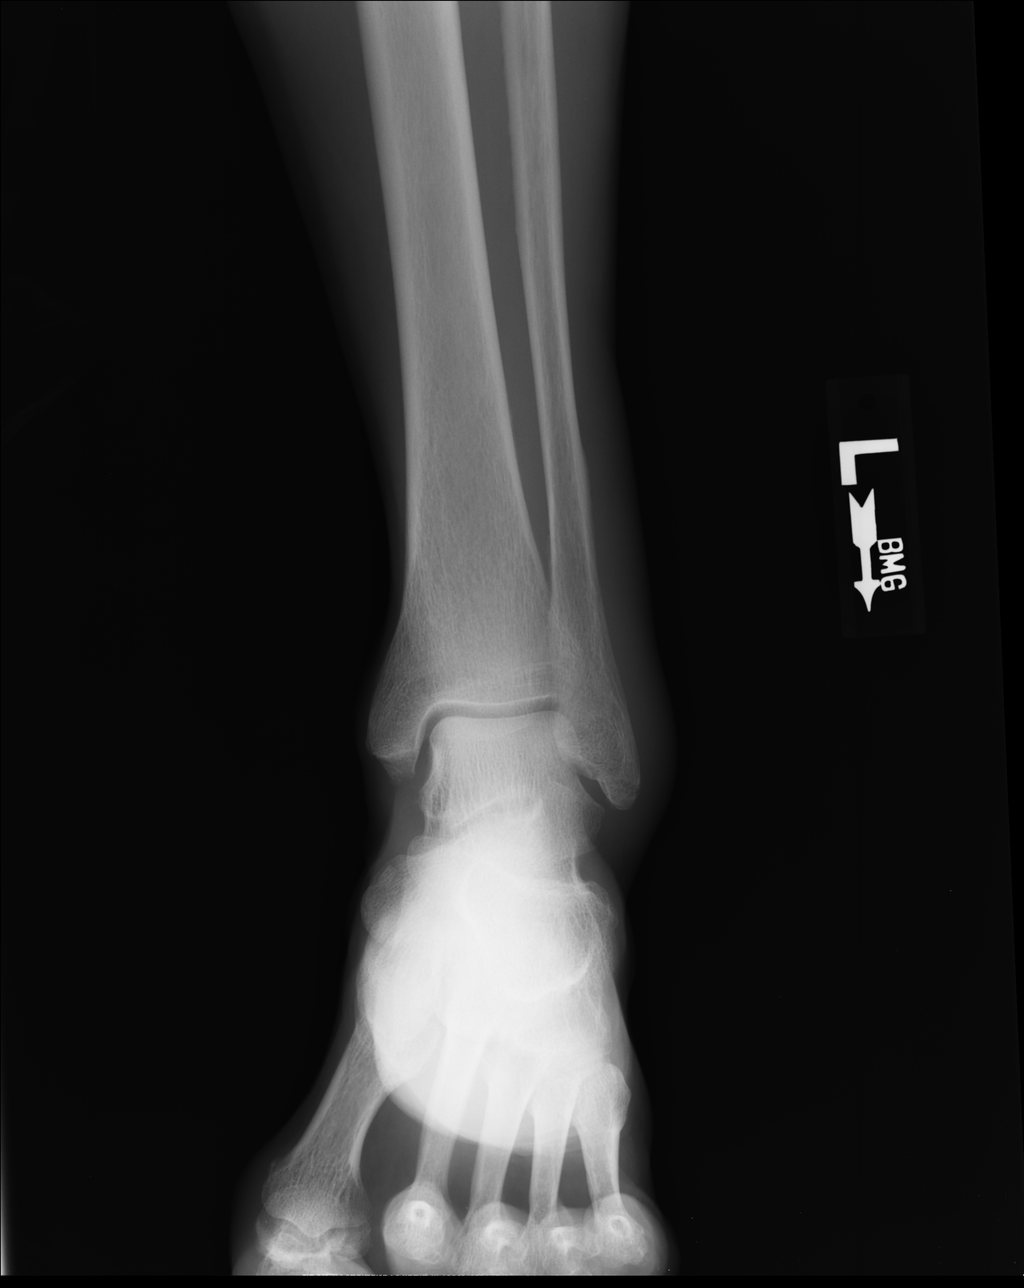

[ap obl int rot]
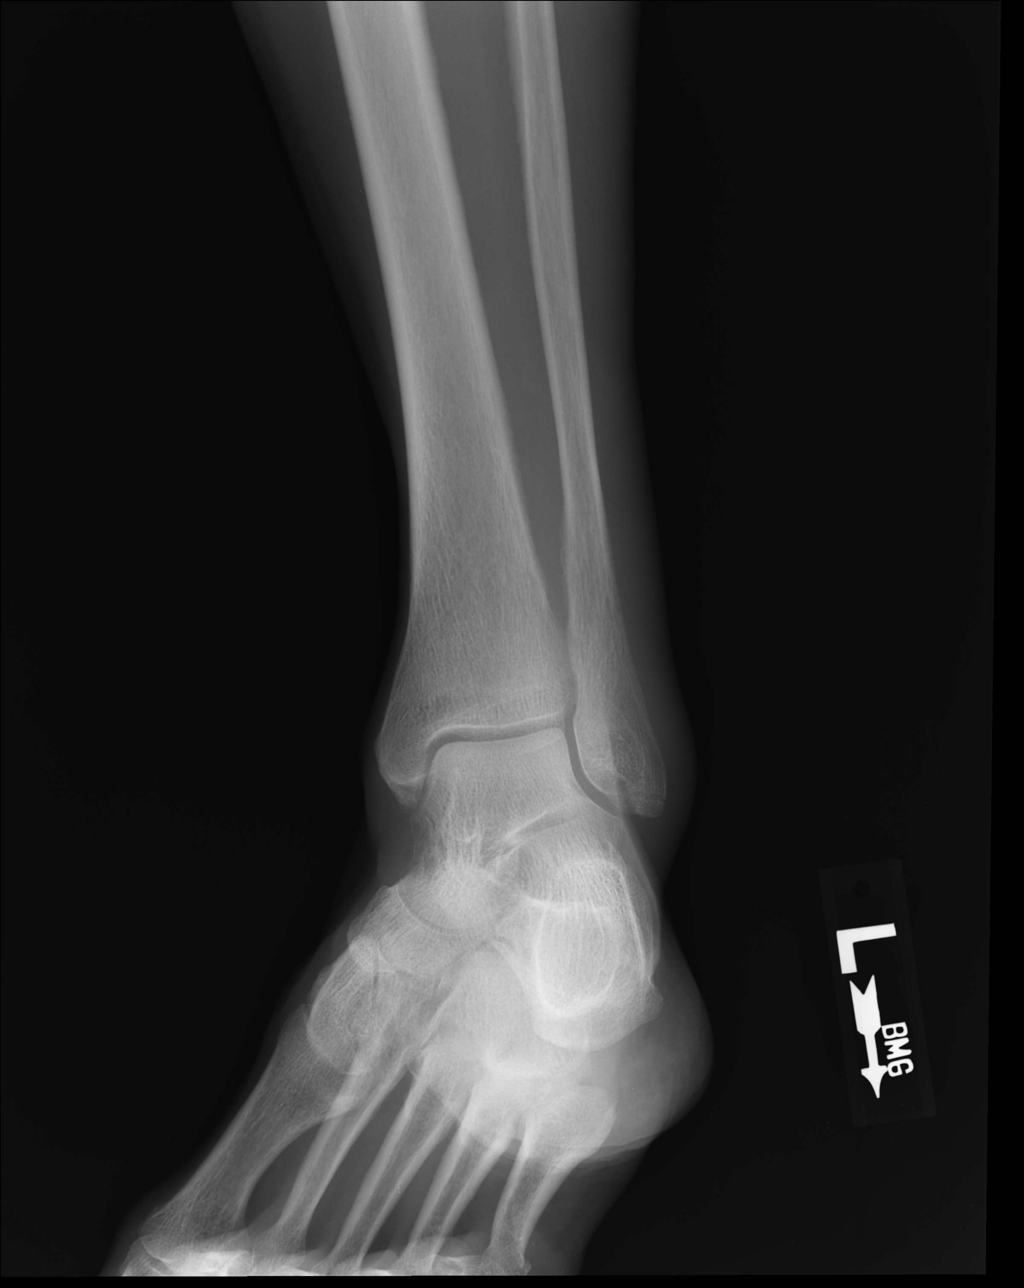

[medial obl]
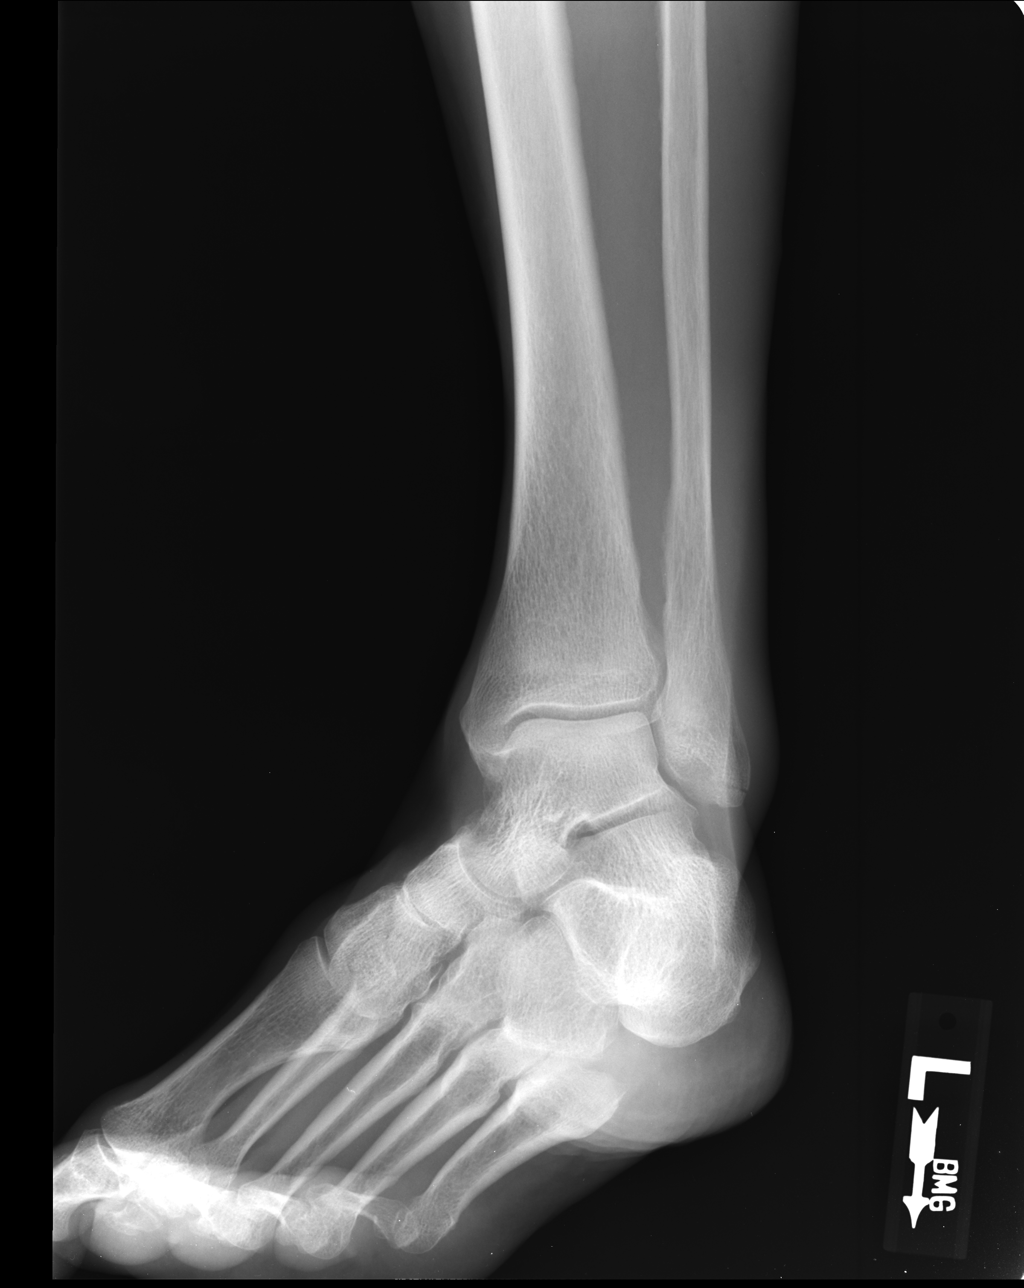

[lateral]
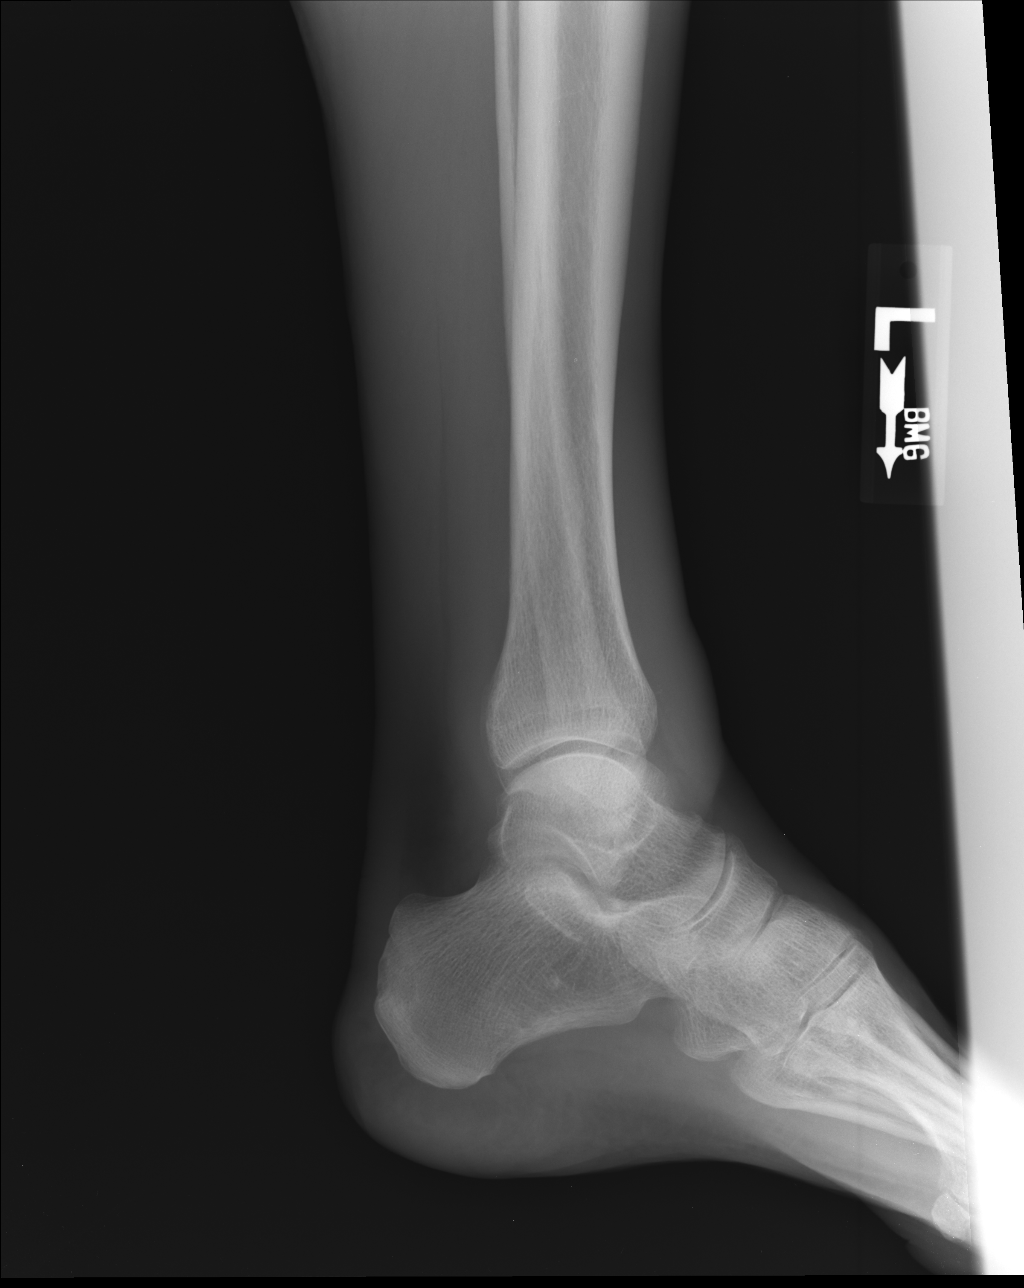

[4 of 4 positions shown; findings below may reference images not displayed]

FINDINGS: There is a transverse nondisplaced avulsion fracture off of the
lateral malleolus, with associated soft tissue swelling. No other
fractures are seen. Osseous mineralization is normal.
IMPRESSION: Nondisplaced avulsion fracture of the lateral malleolus.

## 2017-02-14 DIAGNOSIS — G834 Cauda equina syndrome: Secondary | ICD-10-CM | POA: Diagnosis not present

## 2017-02-14 DIAGNOSIS — M5416 Radiculopathy, lumbar region: Secondary | ICD-10-CM | POA: Diagnosis not present

## 2017-02-14 DIAGNOSIS — M545 Low back pain: Secondary | ICD-10-CM | POA: Diagnosis not present

## 2017-02-16 DIAGNOSIS — H26492 Other secondary cataract, left eye: Secondary | ICD-10-CM | POA: Diagnosis not present

## 2017-02-16 DIAGNOSIS — H524 Presbyopia: Secondary | ICD-10-CM | POA: Diagnosis not present

## 2017-02-16 DIAGNOSIS — H353112 Nonexudative age-related macular degeneration, right eye, intermediate dry stage: Secondary | ICD-10-CM | POA: Diagnosis not present

## 2017-02-16 DIAGNOSIS — H40013 Open angle with borderline findings, low risk, bilateral: Secondary | ICD-10-CM | POA: Diagnosis not present

## 2017-02-17 ENCOUNTER — Ambulatory Visit: Payer: Self-pay | Admitting: Family Medicine

## 2017-02-25 ENCOUNTER — Ambulatory Visit (INDEPENDENT_AMBULATORY_CARE_PROVIDER_SITE_OTHER): Payer: Medicare Other | Admitting: Family Medicine

## 2017-02-25 ENCOUNTER — Other Ambulatory Visit: Payer: Self-pay

## 2017-02-25 ENCOUNTER — Encounter: Payer: Self-pay | Admitting: Family Medicine

## 2017-02-25 VITALS — BP 112/68 | HR 60 | Temp 97.5°F | Resp 18

## 2017-02-25 DIAGNOSIS — I2510 Atherosclerotic heart disease of native coronary artery without angina pectoris: Secondary | ICD-10-CM | POA: Diagnosis not present

## 2017-02-25 DIAGNOSIS — G4710 Hypersomnia, unspecified: Secondary | ICD-10-CM

## 2017-02-25 DIAGNOSIS — M47812 Spondylosis without myelopathy or radiculopathy, cervical region: Secondary | ICD-10-CM | POA: Diagnosis not present

## 2017-02-25 DIAGNOSIS — G834 Cauda equina syndrome: Secondary | ICD-10-CM | POA: Diagnosis not present

## 2017-02-25 DIAGNOSIS — F5104 Psychophysiologic insomnia: Secondary | ICD-10-CM

## 2017-02-25 DIAGNOSIS — R001 Bradycardia, unspecified: Secondary | ICD-10-CM | POA: Diagnosis not present

## 2017-02-25 DIAGNOSIS — I10 Essential (primary) hypertension: Secondary | ICD-10-CM

## 2017-02-25 MED ORDER — PREGABALIN 50 MG PO CAPS: 100.0000 mg | ORAL_CAPSULE | Freq: Every day | ORAL | 0 refills | Status: DC

## 2017-02-25 MED ORDER — METOPROLOL SUCCINATE ER 100 MG PO TB24: 100.0000 mg | ORAL_TABLET | Freq: Every day | ORAL | 1 refills | Status: DC

## 2017-02-25 NOTE — Progress Notes (Signed)
Subjective:    Patient ID: Kaylee Weiss, female    DOB: 30-Oct-1945, 71 y.o.   MRN: [id number]  02/25/2017  Medication Problem (Lyrica and Cymbalta- discuss orthopedic MD's recommendations )    HPI This 71 y.o. female presents for evaluation of chronic lower back pain with cauda equina syndrome.Prescribed a lower back brace; not made for a female.  Sparing use of brace recommended.  Scheduled to inject lower back next week.  Not sure if can get in at L3. May need to revisit the idea of surgery.  Relief approach to surgery.  Bubbles go from R hip to R ankle; bubbles turn into pain; R thigh weakness onset two months ago.  Numbness in R anterior thigh.  Bradycardia: heart rate was 46 recently; felt like an escape beat.  Has been down to 48 at [facility] before.  BP at home not checking.  Obsessed with death:  Onset with Lyrica with Cymbalta.  Sleeping two hours every morning.   Hypersomnia:  Sleeping two hours every morning. Really wants to stop both medications.  Crazy planning funeral.  No sadness.  No anxiety. Son is moving to [facility].  Crying about not seeing daughter on Christmas Eve.  Happy.  Getting along with people; new puppies.  Still not sleeping at night; must take Ambien qhs for sleep yet gone in morning.     BP Readings from Last 3 Encounters:  02/25/17 112/68  01/09/17 102/62  12/17/16 116/78   Wt Readings from Last 3 Encounters:  12/03/16 145 lb (65.8 kg)  06/29/16 143 lb (64.9 kg)  06/01/16 144 lb (65.3 kg)   Immunization History  Administered Date(s) Administered  . Influenza,inj,Quad PF,6+ Mos 11/07/2014, 12/07/2015, 12/03/2016  . Influenza-Unspecified 12/05/2013  . Pneumococcal Conjugate-13 03/07/2014  . Pneumococcal Polysaccharide-23 11/17/2015  . Pneumococcal-Unspecified 03/07/2010  . Td 02/12/2014  . Zoster 03/07/2005  . Zoster Recombinat (Shingrix) 07/15/2016    Review of Systems  Constitutional: Positive for fatigue. Negative for chills, diaphoresis  and fever.  Eyes: Negative for visual disturbance.  Respiratory: Negative for cough and shortness of breath.   Cardiovascular: Positive for palpitations. Negative for chest pain and leg swelling.  Gastrointestinal: Negative for abdominal pain, constipation, diarrhea, nausea and vomiting.  Endocrine: Negative for cold intolerance, heat intolerance, polydipsia, polyphagia and polyuria.  Musculoskeletal: Positive for back pain.  Neurological: Positive for numbness. Negative for dizziness, tremors, seizures, syncope, facial asymmetry, speech difficulty, weakness, light-headedness and headaches.    Past Medical History:  Diagnosis Date  . Anxiety   . Arthritis   . Cauda equina syndrome ([place])   . Cervical spinal stenosis   . Fibromyalgia   . Herpes labialis   . Hyperlipidemia   . Hypertension   . Lumbar spinal stenosis   . Macular degeneration, right eye   . Migraine   . PE (pulmonary thromboembolism) ([place])   . Pulmonary embolism ([place])    age 29; not on OCP.  Coumadin x 2 years.  . Retinal vein occlusion    age 50.  Marland Kitchen Spinal stenosis of lumbar region 12/16/2015   L3-4 level   Past Surgical History:  Procedure Laterality Date  . ABDOMINAL SURGERY    . EYE SURGERY    . HAND SURGERY Left   . ROTATOR CUFF REPAIR Right    Allergies  Allergen Reactions  . Demerol [Meperidine] Anaphylaxis  . Gabapentin Other (See Comments)  . Codeine     Other reaction(s): Other (See Comments)  . Other Other (See Comments)  Suicidal ideation Alcohol (drinking kind); takes liver enzymes and elevates them  . Percocet [Oxycodone-Acetaminophen] Nausea And Vomiting   Current Outpatient Medications on File Prior to Visit  Medication Sig Dispense Refill  . acyclovir ointment (ZOVIRAX) 5 % Apply topically.    Marland Kitchen aspirin 81 MG tablet Take 81 mg by mouth daily.    . celecoxib (CELEBREX) 200 MG capsule Take 1 capsule (200 mg total) by mouth 2 (two) times daily. 180 capsule 3  . DULoxetine (CYMBALTA) 20  MG capsule Take 2 capsules (40 mg total) by mouth daily. 035 capsule 1  . folic acid (FOLVITE) 1 MG tablet Take 1 tablet (1 mg total) by mouth daily. 90 tablet 3  . OVER THE COUNTER MEDICATION OTC B6 100 mgm taking daily    . OVER THE COUNTER MEDICATION OTC preservision areds taking 1 in the am/qhs    . OVER THE COUNTER MEDICATION OTC B12-SL taking everyday    . OVER THE COUNTER MEDICATION Reported on 04/17/2015    . oxyCODONE-acetaminophen (PERCOCET/ROXICET) 5-325 MG tablet Take 1 tablet by mouth every 6 (six) hours as needed for severe pain. 30 tablet 0  . UNABLE TO FIND Uses Lidocaine topical cream @@ night    . valACYclovir (VALTREX) 500 MG tablet Take 1 tablet (500 mg total) by mouth 2 (two) times daily. 180 tablet 3   No current facility-administered medications on file prior to visit.    Social History   Socioeconomic History  . Marital status: Married    Spouse name: Herbie Baltimore  . Number of children: 2  . Years of education: Therapist, sports  . Highest education level: Not on file  Social Needs  . Financial resource strain: Not on file  . Food insecurity - worry: Not on file  . Food insecurity - inability: Not on file  . Transportation needs - medical: Not on file  . Transportation needs - non-medical: Not on file  Occupational History  . 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 Smoking status: Never Smoker  . Smokeless tobacco: Never Used  Substance and Sexual Activity  . Alcohol use: Yes    Alcohol/week: 0.0 oz    Comment: 15 cc/wk  . Drug use: No  . Sexual activity: No    Partners: Male    Comment: Married  Other Topics Concern  . Not on file  Social History Narrative   Marital status:  Married x 47 years      Children: 2 children (49, 51); no granchildren      Lives: with husband, german shepard, kitkat/kitten      Employed: works once per week; has a Patent attorney one day per week; previous therapy practice in 2018.         Tobacco: never      Alcohol:  Socially; one glass of wine per week      Exercise:  Daily exercise; 3.0 miles per day      Advanced Directives:  FULL CODE; no prolonged measures      ADLs: independent with ADLs.        Right-handed   Caffeine: 2 cups of coffee per day         Family History  Problem Relation Age of Onset  . Heart attack Mother   . Stroke Mother        CVA age 93; repeat at age 35 as cause of death.  . Diabetes Mother   .  Heart disease Mother 84       recurrent AMIs  . Hyperlipidemia Mother   . Hypertension Mother   . Diabetes Father   . Heart attack Father   . Cancer Father 37       lung cancer  . Hyperlipidemia Father   . Hypertension Father   . Asthma Son        Objective:    BP 112/68 (BP Location: Left Arm, Patient Position: Sitting, Cuff Size: Normal)   Pulse 60   Temp (!) 97.5 F (36.4 C) (Oral)   Resp 18   SpO2 96%  Physical Exam  Constitutional: She is oriented to person, place, and time. She appears well-developed and well-nourished. No distress.  HENT:  Head: Normocephalic and atraumatic.  Right Ear: External ear normal.  Left Ear: External ear normal.  Nose: Nose normal.  Mouth/Throat: Oropharynx is clear and moist.  Eyes: Conjunctivae and EOM are normal. Pupils are equal, round, and reactive to light.  Neck: Normal range of motion. Neck supple. Carotid bruit is not present. No thyromegaly present.  Cardiovascular: Normal rate, regular rhythm, normal heart sounds and intact distal pulses. Exam reveals no gallop and no friction rub.  No murmur heard. Pulmonary/Chest: Effort normal and breath sounds normal. She has no wheezes. She has no rales.  Abdominal: Soft. Bowel sounds are normal. She exhibits no distension and no mass. There is no tenderness. There is no rebound and no guarding.  Lymphadenopathy:    She has no cervical adenopathy.  Neurological: She is alert and oriented to person, place, and time. No cranial nerve deficit.  Skin:  Skin is warm and dry. No rash noted. She is not diaphoretic. No erythema. No pallor.  Psychiatric: She has a normal mood and affect. Her behavior is normal.   No results found. Depression screen [facility] 2/9 02/25/2017 01/09/2017 12/17/2016 12/03/2016 09/27/2016  Decreased Interest 0 0 0 0 0  Down, Depressed, Hopeless 0 0 0 0 0  PHQ - 2 Score 0 0 0 0 0  Altered sleeping 3 - - - -  Tired, decreased energy 3 - - - -  Change in appetite 0 - - - -  Feeling bad or failure about yourself  0 - - - -  Trouble concentrating 0 - - - -  Moving slowly or fidgety/restless 0 - - - -  Suicidal thoughts 0 - - - -  PHQ-9 Score 6 - - - -  Difficult doing work/chores Not difficult at all - - - -   Fall Risk  02/25/2017 01/09/2017 12/17/2016 12/03/2016 09/27/2016  Falls in the past year? Yes No No Yes No  Comment - - - - -  Number falls in past yr: 1 - - 1 -  Injury with Fall? Yes - - No -  Comment knee right - - - -  Risk Factor Category  - - - - -  Risk for fall due to : - - - - -  Risk for fall due to: Comment - - - - -        Assessment & Plan:   1. Bradycardia   2. Hypersomnolence   3. Arteriosclerosis of coronary artery   4. Essential hypertension   5. Cauda equina syndrome ([facility])   6. Cervical spondylosis without myelopathy   7. Psychophysiological insomnia     New onset bradycardia secondary to high-dose metoprolol and aging.  Decrease metoprolol to 150 mg daily.  Continue to monitor heart rate regularly  at home.  Bradycardia likely contributing to fatigue and hypersomnolence.  Monitor blood pressure at home as well with decreased dose of metoprolol.  Hypersomnolence progressively worsening.  Will treat bradycardia and also decrease Lyrica to 100 mg at bedtime.  In 2 weeks, decrease Lyrica to 50 mg daily.  2 weeks later, decreased Cymbalta to 20 mg daily.  Obtain labs including CBC, comprehensive metabolic panel, and EKG to rule out secondary causes to current bradycardia and  hypersomnolence.  Orders Placed This Encounter  Procedures  . CBC with Differential/Platelet  . Comprehensive metabolic panel  . EKG 12-Lead   Meds ordered this encounter  Medications  . pregabalin (LYRICA) 50 MG capsule    Sig: Take 2 capsules (100 mg total) by mouth at bedtime.    Dispense:  30 capsule    Refill:  0  . DISCONTD: metoprolol (TOPROL-XL) 100 MG 24 hr tablet    Sig: Take 1-2 tablets (100-200 mg total) by mouth daily.    Dispense:  180 tablet    Refill:  1    Return in about 4 weeks (around 03/25/2017) for recheck low heart rate, sleepiness.   Kyriana Yankee Elayne Guerin, M.D. Primary Care at [facility] previously [facility] [place] [address] [place], [place]  [address] [phone] phone [phone] fax

## 2017-02-25 NOTE — Patient Instructions (Addendum)
  1.  Decrease Metoprolol ER to 100mg   1.5 tablets daily. 2.  In two weeks, decrease Lyrica to 50mg . 3.  Then, two weeks later, decrease Cymbalta to 20mg  one capsule daily.   IF you received an x-ray today, you will receive an invoice from [facility]. Please contact [facility] at [phone] with questions or concerns regarding your invoice.   IF you received labwork today, you will receive an invoice from [facility]. Please contact [facility] at [phone] with questions or concerns regarding your invoice.   Our billing staff will not be able to assist you with questions regarding bills from these companies.  You will be contacted with the lab results as soon as they are available. The fastest way to get your results is to activate your My Chart account. Instructions are located on the last page of this paperwork. If you have not heard from [place] regarding the results in 2 weeks, please contact this office.

## 2017-02-26 LAB — COMPREHENSIVE METABOLIC PANEL
A/G RATIO: 1.8 (ref 1.2–2.2)
ALBUMIN: 3.8 g/dL (ref 3.5–4.8)
ALT: 30 IU/L (ref 0–32)
AST: 31 IU/L (ref 0–40)
Alkaline Phosphatase: 98 IU/L (ref 39–117)
BUN / CREAT RATIO: 21 (ref 12–28)
BUN: 18 mg/dL (ref 8–27)
Bilirubin Total: 0.5 mg/dL (ref 0.0–1.2)
CALCIUM: 9.2 mg/dL (ref 8.7–10.3)
CO2: 23 mmol/L (ref 20–29)
CREATININE: 0.87 mg/dL (ref 0.57–1.00)
Chloride: 105 mmol/L (ref 96–106)
GFR calc Af Amer: 77 mL/min/{1.73_m2} (ref >59)
GFR, EST NON AFRICAN AMERICAN: 67 mL/min/{1.73_m2} (ref >59)
GLOBULIN, TOTAL: 2.1 g/dL (ref 1.5–4.5)
Glucose: 76 mg/dL (ref 65–99)
POTASSIUM: 4.3 mmol/L (ref 3.5–5.2)
SODIUM: 144 mmol/L (ref 134–144)
Total Protein: 5.9 g/dL — ABNORMAL LOW (ref 6.0–8.5)

## 2017-02-26 LAB — CBC WITH DIFFERENTIAL/PLATELET
BASOS: 0 %
Basophils Absolute: 0 10*3/uL (ref 0.0–0.2)
EOS (ABSOLUTE): 0.2 10*3/uL (ref 0.0–0.4)
EOS: 3 %
HEMATOCRIT: 42.5 % (ref 34.0–46.6)
HEMOGLOBIN: 13.7 g/dL (ref 11.1–15.9)
IMMATURE GRANULOCYTES: 0 %
Immature Grans (Abs): 0 10*3/uL (ref 0.0–0.1)
LYMPHS ABS: 1.9 10*3/uL (ref 0.7–3.1)
Lymphs: 29 %
MCH: 30.2 pg (ref 26.6–33.0)
MCHC: 32.2 g/dL (ref 31.5–35.7)
MCV: 94 fL (ref 79–97)
MONOCYTES: 10 %
MONOS ABS: 0.6 10*3/uL (ref 0.1–0.9)
Neutrophils Absolute: 3.8 10*3/uL (ref 1.4–7.0)
Neutrophils: 58 %
Platelets: 213 10*3/uL (ref 150–379)
RBC: 4.54 x10E6/uL (ref 3.77–5.28)
RDW: 13.9 % (ref 12.3–15.4)
WBC: 6.5 10*3/uL (ref 3.4–10.8)

## 2017-03-02 DIAGNOSIS — H26491 Other secondary cataract, right eye: Secondary | ICD-10-CM | POA: Diagnosis not present

## 2017-03-03 DIAGNOSIS — M545 Low back pain: Secondary | ICD-10-CM | POA: Diagnosis not present

## 2017-03-03 DIAGNOSIS — G834 Cauda equina syndrome: Secondary | ICD-10-CM | POA: Diagnosis not present

## 2017-03-03 DIAGNOSIS — M5416 Radiculopathy, lumbar region: Secondary | ICD-10-CM | POA: Diagnosis not present

## 2017-03-04 ENCOUNTER — Other Ambulatory Visit: Payer: Self-pay | Admitting: Family Medicine

## 2017-03-04 DIAGNOSIS — N3289 Other specified disorders of bladder: Secondary | ICD-10-CM

## 2017-03-08 ENCOUNTER — Other Ambulatory Visit: Payer: Self-pay | Admitting: Family Medicine

## 2017-03-21 DIAGNOSIS — G834 Cauda equina syndrome: Secondary | ICD-10-CM | POA: Diagnosis not present

## 2017-03-21 DIAGNOSIS — M545 Low back pain: Secondary | ICD-10-CM | POA: Diagnosis not present

## 2017-03-21 DIAGNOSIS — M5416 Radiculopathy, lumbar region: Secondary | ICD-10-CM | POA: Diagnosis not present

## 2017-04-03 ENCOUNTER — Other Ambulatory Visit: Payer: Self-pay

## 2017-04-03 ENCOUNTER — Ambulatory Visit (INDEPENDENT_AMBULATORY_CARE_PROVIDER_SITE_OTHER): Payer: Medicare Other | Admitting: Family Medicine

## 2017-04-03 ENCOUNTER — Encounter: Payer: Self-pay | Admitting: Family Medicine

## 2017-04-03 VITALS — BP 126/78 | HR 76 | Temp 98.5°F | Resp 16 | Ht 64.96 in

## 2017-04-03 DIAGNOSIS — M48062 Spinal stenosis, lumbar region with neurogenic claudication: Secondary | ICD-10-CM | POA: Diagnosis not present

## 2017-04-03 DIAGNOSIS — R001 Bradycardia, unspecified: Secondary | ICD-10-CM

## 2017-04-03 DIAGNOSIS — G834 Cauda equina syndrome: Secondary | ICD-10-CM

## 2017-04-03 DIAGNOSIS — I2510 Atherosclerotic heart disease of native coronary artery without angina pectoris: Secondary | ICD-10-CM | POA: Diagnosis not present

## 2017-04-03 DIAGNOSIS — G4710 Hypersomnia, unspecified: Secondary | ICD-10-CM | POA: Diagnosis not present

## 2017-04-03 DIAGNOSIS — N3942 Incontinence without sensory awareness: Secondary | ICD-10-CM

## 2017-04-03 DIAGNOSIS — R21 Rash and other nonspecific skin eruption: Secondary | ICD-10-CM | POA: Diagnosis not present

## 2017-04-03 DIAGNOSIS — G8929 Other chronic pain: Secondary | ICD-10-CM

## 2017-04-03 DIAGNOSIS — J3081 Allergic rhinitis due to animal (cat) (dog) hair and dander: Secondary | ICD-10-CM

## 2017-04-03 DIAGNOSIS — R059 Cough, unspecified: Secondary | ICD-10-CM

## 2017-04-03 DIAGNOSIS — M5441 Lumbago with sciatica, right side: Secondary | ICD-10-CM

## 2017-04-03 DIAGNOSIS — F5104 Psychophysiologic insomnia: Secondary | ICD-10-CM | POA: Diagnosis not present

## 2017-04-03 DIAGNOSIS — I10 Essential (primary) hypertension: Secondary | ICD-10-CM | POA: Diagnosis not present

## 2017-04-03 DIAGNOSIS — R05 Cough: Secondary | ICD-10-CM

## 2017-04-03 MED ORDER — DULOXETINE HCL 20 MG PO CPEP: 20.0000 mg | ORAL_CAPSULE | Freq: Every day | ORAL | 1 refills | Status: DC

## 2017-04-03 MED ORDER — PREGABALIN 25 MG PO CAPS: 25.0000 mg | ORAL_CAPSULE | Freq: Two times a day (BID) | ORAL | 1 refills | Status: DC

## 2017-04-03 MED ORDER — METOPROLOL SUCCINATE ER 100 MG PO TB24: 100.0000 mg | ORAL_TABLET | Freq: Every day | ORAL | 3 refills | Status: DC

## 2017-04-03 NOTE — Patient Instructions (Addendum)
  Increase Metoprolol to 100mg  1.5 tablets daily. (150mg )   IF you received an x-ray today, you will receive an invoice from [facility]. Please contact [facility] at [phone] with questions or concerns regarding your invoice.   IF you received labwork today, you will receive an invoice from [place]. Please contact [facility] at [phone] with questions or concerns regarding your invoice.   Our billing staff will not be able to assist you with questions regarding bills from these companies.  You will be contacted with the lab results as soon as they are available. The fastest way to get your results is to activate your My Chart account. Instructions are located on the last page of this paperwork. If you have not heard from [place] regarding the results in 2 weeks, please contact this office.

## 2017-04-03 NOTE — Progress Notes (Signed)
Subjective:    Patient ID: Kaylee Weiss, female    DOB: [birth year], 72 y.o.   MRN: [id number]  04/03/2017  Bradycardia (4 week follow-up ) and Back Problem    HPI This 72 y.o. female presents for one month follow-up evaluation of bradycardia, hypersomnolence, chronic lower back pain.  Management changes made at last visit: -New onset bradycardia secondary to high-dose metoprolol and aging.  Decrease metoprolol to 150 mg daily.  Continue to monitor heart rate regularly at home.  Bradycardia likely contributing to fatigue and hypersomnolence.  Monitor blood pressure at home as well with decreased dose of metoprolol. -Hypersomnolence progressively worsening.  Will treat bradycardia and also decrease Lyrica to 100 mg at bedtime.  In 2 weeks, decrease Lyrica to 50 mg daily.  2 weeks later, decreased Cymbalta to 20 mg daily.  Obtain labs including CBC, comprehensive metabolic panel, and EKG to rule out secondary causes to current bradycardia and hypersomnolence.  Good list: No longer sleeping 2-3 hours in the morning. Feels like myself.  Really remarkable. No thoughts of death.  No SI ever. Son is here and living in [place].  Life is good. Satisfied.  Overall happy and satisfied. Loves puppy!  Me with a hound.  Taking dog classes.  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    Bad list: L3 is gone; very nice person; willing to keep on team.  L3 is a mess.  Suggested surgery but never mentioned again.  Leg is much worse, no question about it.   Pain at night is 5-8/10.  Worsening.  Pain is in entire leg and bubbles are in entire RIGHT leg.  Some pain in left leg.  No longer can walk with friends every day; can walk 3-5 miles qod.  Slow progression. No real burning until recently; burning is not comfortable.   Has back pain now.  Dr. Carolin Coy recommendation was physical therapy.   Did recommend injections; only three times per year.  He performed injection 02-18-17.  [place].  Very clean. Very  matter of fact.  No need for gown.  Knew wanted biofeedback.  Did not think to get into L3; allow to try.  Able to get into L4-L5 immediately. Caused spasm in leg.  Able to get into L3. Went home and was ten minutes from house; recommend laying supine for two days. Very different advice.  Two different braces two different sizes.  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 Brace does help.  Not along, very happy; husband very supportive.  Still needs a small nap even on small dose of Lyrica 30mg  and Cymbalta 20mg .   Does not want opiates; has taken two in 60 days.  Requested exercises on ball.  Can ride stationary bike 15 minutes. was up to one hour.  Cough is no better.  Intermittent.  Always there and worsens.  Has been coughing for two years.  Morning and night; yellow thick.  Nasal congestion; +PND.  Possible GERD symptoms contributing as well.  Has been concerned that suffering with a sinus infection recently as suffers with a morning headache that resolves after sitting up.  CAD: slightly more angina with decrease in Metoprolol.  Upper neck B and into L arm.  Then recalled that Metoprolol increased.  Skin spots: needing evaluation.  Worried about ITP as daughter suffers with ITP.  Spots on skin transient and will turn into bruises.  Denies epistaxis, hematuria, bloody stools, gums bleeding more than baseline. Taking 100mg  Metoprolol  ER.    BP Readings from Last 3 Encounters:  04/03/17 126/78  02/25/17 112/68  01/09/17 102/62   Wt Readings from Last 3 Encounters:  12/03/16 145 lb (65.8 kg)  06/29/16 143 lb (64.9 kg)  06/01/16 144 lb (65.3 kg)   Immunization History  Administered Date(s) Administered  . Influenza,inj,Quad PF,6+ Mos 11/07/2014, 12/07/2015, 12/03/2016  . Influenza-Unspecified 12/05/2013  . Pneumococcal Conjugate-13 03/07/2014  . Pneumococcal Polysaccharide-23 11/17/2015  . Pneumococcal-Unspecified 03/07/2010  . Td 02/12/2014  . Zoster  03/07/2005  . Zoster Recombinat (Shingrix) 07/15/2016    Review of Systems  Constitutional: Negative for chills, diaphoresis, fatigue and fever.  HENT: Positive for congestion, postnasal drip, rhinorrhea, sinus pressure, sinus pain and voice change. Negative for ear pain, sore throat and trouble swallowing.   Eyes: Negative for visual disturbance.  Respiratory: Positive for cough. Negative for shortness of breath.   Cardiovascular: Negative for chest pain, palpitations and leg swelling.  Gastrointestinal: Negative for abdominal pain, constipation, diarrhea, nausea and vomiting.  Endocrine: Negative for cold intolerance, heat intolerance, polydipsia, polyphagia and polyuria.  Genitourinary: Positive for difficulty urinating.  Musculoskeletal: Positive for back pain.  Neurological: Positive for weakness, numbness and headaches. Negative for dizziness, tremors, seizures, syncope, facial asymmetry, speech difficulty and light-headedness.  Hematological: Bruises/bleeds easily.  Psychiatric/Behavioral: Positive for sleep disturbance. Negative for dysphoric mood. The patient is not nervous/anxious.     Past Medical History:  Diagnosis Date  . Anxiety   . Arthritis   . Cauda equina syndrome ([place])   . Cervical spinal stenosis   . Fibromyalgia   . Herpes labialis   . Hyperlipidemia   . Hypertension   . Lumbar spinal stenosis   . Macular degeneration, right eye   . Migraine   . PE (pulmonary thromboembolism) ([place])   . Pulmonary embolism ([place])    age 28; not on OCP.  Coumadin x 2 years.  . Retinal vein occlusion    age 43.  Marland Kitchen Spinal stenosis of lumbar region 12/16/2015   L3-4 level   Past Surgical History:  Procedure Laterality Date  . ABDOMINAL SURGERY    . EYE SURGERY    . HAND SURGERY Left   . ROTATOR CUFF REPAIR Right    Allergies  Allergen Reactions  . Demerol [Meperidine] Anaphylaxis  . Gabapentin Other (See Comments)  . Codeine     Other reaction(s): Other (See  Comments)  . Other Other (See Comments)    Suicidal ideation Alcohol (drinking kind); takes liver enzymes and elevates them  . Percocet [Oxycodone-Acetaminophen] Nausea And Vomiting   Current Outpatient Medications on File Prior to Visit  Medication Sig Dispense Refill  . acyclovir ointment (ZOVIRAX) 5 % Apply topically.    Marland Kitchen aspirin 81 MG tablet Take 81 mg by mouth daily.    Marland Kitchen atorvastatin (LIPITOR) 10 MG tablet take 1 tablet by mouth once daily 90 tablet 3  . buPROPion (WELLBUTRIN XL) 150 MG 24 hr tablet take 1 tablet by mouth once daily 90 tablet 0  . celecoxib (CELEBREX) 200 MG capsule Take 1 capsule (200 mg total) by mouth 2 (two) times daily. 269 capsule 3  . folic acid (FOLVITE) 1 MG tablet Take 1 tablet (1 mg total) by mouth daily. 90 tablet 3  . losartan (COZAAR) 25 MG tablet take 1 tablet by mouth once daily 90 tablet 1  . OVER THE COUNTER MEDICATION OTC B6 100 mgm taking daily    . OVER THE COUNTER MEDICATION OTC preservision areds taking 1 in  the am/qhs    . OVER THE COUNTER MEDICATION OTC B12-SL taking everyday    . OVER THE COUNTER MEDICATION Reported on 04/17/2015    . oxyCODONE-acetaminophen (PERCOCET/ROXICET) 5-325 MG tablet Take 1 tablet by mouth every 6 (six) hours as needed for severe pain. 30 tablet 0  . tolterodine (DETROL LA) 2 MG 24 hr capsule take 1 capsule by mouth once daily 90 capsule 0  . UNABLE TO FIND Uses Lidocaine topical cream @@ night    . valACYclovir (VALTREX) 500 MG tablet Take 1 tablet (500 mg total) by mouth 2 (two) times daily. 180 tablet 3   No current facility-administered medications on file prior to visit.    Social History   Socioeconomic History  . Marital status: Married    Spouse name: Herbie Baltimore  . Number of children: 2  . Years of education: Therapist, sports  . Highest education level: Not on file  Social Needs  . Financial resource strain: Not on file  . Food insecurity - worry: Not on file  . Food insecurity - inability: Not on file  .  Transportation needs - medical: Not on file  . Transportation needs - non-medical: Not on file  Occupational History  . 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 Smoking status: Never Smoker  . Smokeless tobacco: Never Used  Substance and Sexual Activity  . Alcohol use: Yes    Alcohol/week: 0.0 oz    Comment: 15 cc/wk  . Drug use: No  . Sexual activity: No    Partners: Male    Comment: Married  Other Topics Concern  . Not on file  Social History Narrative   Marital status:  Married x 47 years      Children: 2 children (49, 39); no granchildren      Lives: with husband, german shepard, kitkat/kitten      Employed: works once per week; has a Patent attorney one day per week; previous therapy practice in 2018.        Tobacco: never      Alcohol:  Socially; one glass of wine per week      Exercise:  Daily exercise; 3.0 miles per day      Advanced Directives:  FULL CODE; no prolonged measures      ADLs: independent with ADLs.        Right-handed   Caffeine: 2 cups of coffee per day         Family History  Problem Relation Age of Onset  . Heart attack Mother   . Stroke Mother        CVA age 52; repeat at age 66 as cause of death.  . Diabetes Mother   . Heart disease Mother 14       recurrent AMIs  . Hyperlipidemia Mother   . Hypertension Mother   . Diabetes Father   . Heart attack Father   . Cancer Father 73       lung cancer  . Hyperlipidemia Father   . Hypertension Father   . Asthma Son        Objective:    BP 126/78   Pulse 76   Temp 98.5 F (36.9 C) (Oral)   Resp 16   Ht 5' 4.96" (1.65 m)   SpO2 96%   BMI 24.16 kg/m   Physical Exam  Constitutional: She is oriented to person, place, and time. She appears well-developed and well-nourished. No  distress.  HENT:  Head: Normocephalic and atraumatic.  Right Ear: External ear normal.  Left Ear: External ear normal.  Nose: Nose normal.  Mouth/Throat:  Oropharynx is clear and moist.  Eyes: Conjunctivae and EOM are normal. Pupils are equal, round, and reactive to light.  Neck: Normal range of motion. Neck supple. Carotid bruit is not present. No thyromegaly present.  Cardiovascular: Normal rate, regular rhythm, normal heart sounds and intact distal pulses. Exam reveals no gallop and no friction rub.  No murmur heard. Pulmonary/Chest: Effort normal and breath sounds normal. She has no wheezes. She has no rales.  Lymphadenopathy:    She has no cervical adenopathy.  Neurological: She is alert and oriented to person, place, and time. No cranial nerve deficit.  Skin: Skin is warm and dry. Rash noted. She is not diaphoretic. No erythema. No pallor.  Scattered along torso: Nonblanching petechial lesions versus hemangioma lesions.  Psychiatric: She has a normal mood and affect. Her behavior is normal. Judgment and thought content normal.   No results found. Depression screen [facility] 2/9 04/03/2017 02/25/2017 01/09/2017 12/17/2016 12/03/2016  Decreased Interest 0 0 0 0 0  Down, Depressed, Hopeless 0 0 0 0 0  PHQ - 2 Score 0 0 0 0 0  Altered sleeping - 3 - - -  Tired, decreased energy - 3 - - -  Change in appetite - 0 - - -  Feeling bad or failure about yourself  - 0 - - -  Trouble concentrating - 0 - - -  Moving slowly or fidgety/restless - 0 - - -  Suicidal thoughts - 0 - - -  PHQ-9 Score - 6 - - -  Difficult doing work/chores - Not difficult at all - - -   Fall Risk  04/03/2017 02/25/2017 01/09/2017 12/17/2016 12/03/2016  Falls in the past year? No Yes No No Yes  Comment - - - - -  Number falls in past yr: - 1 - - 1  Injury with Fall? - Yes - - No  Comment - knee right - - -  Risk Factor Category  - - - - -  Risk for fall due to : - - - - -  Risk for fall due to: Comment - - - - -        Assessment & Plan:   1. Hypersomnolence   2. Rash and nonspecific skin eruption   3. Essential hypertension   4. Arteriosclerosis of coronary artery     5. Cauda equina syndrome ([place])   6. Chronic bilateral low back pain with right-sided sciatica   7. Psychophysiological insomnia   8. Spinal stenosis of lumbar region with neurogenic claudication   9. Urinary incontinence without sensory awareness   10. Bradycardia   11. Cough   12. Allergic rhinitis due to animal hair and dander    Hypersomnolence improved with decreasing dose of Lyrica and Cymbalta.  Prescribed Lyrica 25 mg daily as well as Cymbalta 20 mg daily.  I think this is a nice balance of pain management with the least side effects for patient.  She is a improved quality of life due to less hypersomnolence and pain control is moderate.  New onset nonblanching rash along torso.  Daughter with ITP; thus, obtain platelet count.  Bradycardia has improved with decreased dose of metoprolol extended release 100 mg daily; however, patient now having intermittent angina.  Agreeable to increasing metoprolol ER to 150 mg daily. If hypersomnolence worsens with increased dose of metoprolol,  consider alternatives.  Status post consultation with Dr. Ron Agee for chronic back pain.  Status post lumbar spine injection for pain relief.  Patient requests a referral to physical therapy and will initiate ball therapy in upcoming weeks.  Persistent cough: Consistent with uncontrolled allergic rhinitis possibly due to animal hair and dander.  Uncontrolled GERD may also be contributing.  Status post chest x-ray in past 3 months that was negative.  Recommend Flonase nasal spray 2 sprays into each nostril daily.  Also consider Claritin 10 mg daily.  If no improvement with treating allergic rhinitis, consider addition of Zantac 150 mg twice daily.   Orders Placed This Encounter  Procedures  . CBC with Differential/Platelet   Meds ordered this encounter  Medications  . DULoxetine (CYMBALTA) 20 MG capsule    Sig: Take 1 capsule (20 mg total) by mouth daily.    Dispense:  90 capsule    Refill:  1  .  pregabalin (LYRICA) 25 MG capsule    Sig: Take 1 capsule (25 mg total) by mouth 2 (two) times daily.    Dispense:  90 capsule    Refill:  1  . metoprolol succinate (TOPROL-XL) 100 MG 24 hr tablet    Sig: Take 1-2 tablets (100-200 mg total) by mouth daily. Take with or immediately following a meal.    Dispense:  180 tablet    Refill:  3    No Follow-up on file.   Kaylee Weiss, M.D. Primary Care at [facility] [address] [place], [place]  [address] [phone] phone [phone] fax

## 2017-04-04 LAB — CBC WITH DIFFERENTIAL/PLATELET
BASOS ABS: 0 10*3/uL (ref 0.0–0.2)
Basos: 0 %
EOS (ABSOLUTE): 0.3 10*3/uL (ref 0.0–0.4)
EOS: 4 %
HEMATOCRIT: 44 % (ref 34.0–46.6)
HEMOGLOBIN: 14.6 g/dL (ref 11.1–15.9)
IMMATURE GRANULOCYTES: 0 %
Immature Grans (Abs): 0 10*3/uL (ref 0.0–0.1)
Lymphocytes Absolute: 3.2 10*3/uL — ABNORMAL HIGH (ref 0.7–3.1)
Lymphs: 38 %
MCH: 30.2 pg (ref 26.6–33.0)
MCHC: 33.2 g/dL (ref 31.5–35.7)
MCV: 91 fL (ref 79–97)
MONOCYTES: 10 %
Monocytes Absolute: 0.8 10*3/uL (ref 0.1–0.9)
NEUTROS PCT: 48 %
Neutrophils Absolute: 4.1 10*3/uL (ref 1.4–7.0)
Platelets: 221 10*3/uL (ref 150–379)
RBC: 4.83 x10E6/uL (ref 3.77–5.28)
RDW: 13.7 % (ref 12.3–15.4)
WBC: 8.4 10*3/uL (ref 3.4–10.8)

## 2017-04-07 DIAGNOSIS — M545 Low back pain: Secondary | ICD-10-CM | POA: Diagnosis not present

## 2017-04-11 DIAGNOSIS — M545 Low back pain: Secondary | ICD-10-CM | POA: Diagnosis not present

## 2017-04-12 ENCOUNTER — Other Ambulatory Visit: Payer: Self-pay | Admitting: Family Medicine

## 2017-04-12 DIAGNOSIS — R2681 Unsteadiness on feet: Secondary | ICD-10-CM

## 2017-04-18 ENCOUNTER — Other Ambulatory Visit: Payer: Self-pay | Admitting: Family Medicine

## 2017-04-21 ENCOUNTER — Other Ambulatory Visit: Payer: Self-pay | Admitting: Family Medicine

## 2017-04-21 DIAGNOSIS — M545 Low back pain: Secondary | ICD-10-CM | POA: Diagnosis not present

## 2017-04-21 NOTE — Telephone Encounter (Signed)
Ambien refill request - controlled substance  Last OV 04/03/17  [facility] [place], [place]

## 2017-04-22 NOTE — Telephone Encounter (Signed)
Refill req Ambien sent to Dr. Tamala Julian

## 2017-05-05 DIAGNOSIS — M545 Low back pain: Secondary | ICD-10-CM | POA: Diagnosis not present

## 2017-05-08 ENCOUNTER — Other Ambulatory Visit: Payer: Self-pay

## 2017-05-08 ENCOUNTER — Ambulatory Visit (INDEPENDENT_AMBULATORY_CARE_PROVIDER_SITE_OTHER): Payer: Medicare Other | Admitting: Family Medicine

## 2017-05-08 ENCOUNTER — Encounter: Payer: Self-pay | Admitting: Family Medicine

## 2017-05-08 VITALS — BP 116/68 | HR 70 | Temp 98.0°F | Resp 16 | Ht 65.16 in

## 2017-05-08 DIAGNOSIS — Z1211 Encounter for screening for malignant neoplasm of colon: Secondary | ICD-10-CM | POA: Diagnosis not present

## 2017-05-08 DIAGNOSIS — G834 Cauda equina syndrome: Secondary | ICD-10-CM

## 2017-05-08 DIAGNOSIS — I2510 Atherosclerotic heart disease of native coronary artery without angina pectoris: Secondary | ICD-10-CM | POA: Diagnosis not present

## 2017-05-08 DIAGNOSIS — I10 Essential (primary) hypertension: Secondary | ICD-10-CM | POA: Diagnosis not present

## 2017-05-08 DIAGNOSIS — M797 Fibromyalgia: Secondary | ICD-10-CM | POA: Diagnosis not present

## 2017-05-08 DIAGNOSIS — M5441 Lumbago with sciatica, right side: Secondary | ICD-10-CM | POA: Diagnosis not present

## 2017-05-08 DIAGNOSIS — E2839 Other primary ovarian failure: Secondary | ICD-10-CM

## 2017-05-08 DIAGNOSIS — L280 Lichen simplex chronicus: Secondary | ICD-10-CM | POA: Diagnosis not present

## 2017-05-08 DIAGNOSIS — G8929 Other chronic pain: Secondary | ICD-10-CM | POA: Diagnosis not present

## 2017-05-08 DIAGNOSIS — Z0000 Encounter for general adult medical examination without abnormal findings: Secondary | ICD-10-CM | POA: Diagnosis not present

## 2017-05-08 DIAGNOSIS — M47812 Spondylosis without myelopathy or radiculopathy, cervical region: Secondary | ICD-10-CM

## 2017-05-08 DIAGNOSIS — N3942 Incontinence without sensory awareness: Secondary | ICD-10-CM | POA: Diagnosis not present

## 2017-05-08 DIAGNOSIS — L821 Other seborrheic keratosis: Secondary | ICD-10-CM

## 2017-05-08 DIAGNOSIS — E7800 Pure hypercholesterolemia, unspecified: Secondary | ICD-10-CM | POA: Diagnosis not present

## 2017-05-08 DIAGNOSIS — L570 Actinic keratosis: Secondary | ICD-10-CM

## 2017-05-08 MED ORDER — FLUTICASONE PROPIONATE 50 MCG/ACT NA SUSP: 2.0000 | Freq: Every day | NASAL | 12 refills | Status: DC

## 2017-05-08 MED ORDER — DULOXETINE HCL 30 MG PO CPEP: 30.0000 mg | ORAL_CAPSULE | Freq: Every day | ORAL | 1 refills | Status: DC

## 2017-05-08 MED ORDER — NITROGLYCERIN 0.4 MG SL SUBL: 0.4000 mg | SUBLINGUAL_TABLET | SUBLINGUAL | 3 refills | Status: DC | PRN

## 2017-05-08 NOTE — Progress Notes (Signed)
Subjective:    Patient ID: Kaylee Weiss, female    DOB: Jan 30, 1946, 73 y.o.   MRN: [id number]  05/08/2017  Medicare Wellness    HPI This 72 y.o. female presents for Medicare Annual Wellness Examination and follow-up of chronic medical conditions including hypertension, hypercholesterolemia, anxiety/depression, DDD lumbar spine, insomnia.  Last physical:   03-22-2016 Pap smear:  N/d Mammogram:  01/18/2017 Colonoscopy:  2006; refuses repeat.  Bone density:  04/10/2015 Eye exam: cataract in 2018. Glasses.  Macular degeneration.  Dental exam:    Next week.   Visual Acuity Screening   Right eye Left eye Both eyes  Without correction:     With correction: 20/30 20/30 20/30     BP Readings from Last 3 Encounters:  05/08/17 116/68  04/03/17 126/78  02/25/17 112/68   Wt Readings from Last 3 Encounters:  12/03/16 145 lb (65.8 kg)  06/29/16 143 lb (64.9 kg)  06/01/16 144 lb (65.3 kg)   Immunization History  Administered Date(s) Administered  . Influenza,inj,Quad PF,6+ Mos 11/07/2014, 12/07/2015, 12/03/2016  . Influenza-Unspecified 12/05/2013  . Pneumococcal Conjugate-13 03/07/2014  . Pneumococcal Polysaccharide-23 11/17/2015  . Pneumococcal-Unspecified 03/07/2010  . Td 02/12/2014  . Zoster 03/07/2005  . Zoster Recombinat (Shingrix) 07/15/2016   Health Maintenance  Topic Date Due  . COLONOSCOPY  11/11/2019 (Originally 03/07/2014)  . MAMMOGRAM  01/19/2019  . TETANUS/TDAP  02/13/2024  . INFLUENZA VACCINE  Completed  . DEXA SCAN  Completed  . Hepatitis C Screening  Completed  . PNA vac Low Risk Adult  Completed  Management changes made at last visit include the following: -Hypersomnolence improved with decreasing dose of Lyrica and Cymbalta.  Prescribed Lyrica 25 mg daily as well as Cymbalta 20 mg daily.  I think this is a nice balance of pain management with the least side effects for patient.  She is a improved quality of life due to less hypersomnolence and pain control is  moderate. -New onset nonblanching rash along torso.  Daughter with ITP; thus, obtain platelet count. -Bradycardia has improved with decreased dose of metoprolol extended release 100 mg daily; however, patient now having intermittent angina.  Agreeable to increasing metoprolol ER to 150 mg daily. If hypersomnolence worsens with increased dose of metoprolol, consider alternatives. -Status post consultation with Dr. Ron Agee for chronic back pain.  Status post lumbar spine injection for pain relief.  Patient requests a referral to physical therapy and will initiate ball therapy in upcoming weeks. -Persistent cough: Consistent with uncontrolled allergic rhinitis possibly due to animal hair and dander.  -Uncontrolled GERD may also be contributing.  Status post chest x-ray in past 3 months that was negative.  -Recommend Flonase nasal spray 2 sprays into each nostril daily.  Also consider Claritin 10 mg daily.  If no improvement with treating allergic rhinitis, consider addition of Zantac 150 mg twice daily.  UPDATE: DDD lumbar spine with cauda equina syndrome: went to physical therapy; went requesting ball training; s/p 4 training; nice routine for the ball; going back again for balance training.   Urinary incontinence with recurrent UTI: thinks has recurrent UTI.  Husband going to [facility] on Monday. Taking five years on wait list; three times cheaper than one place in [place].   Exercise room, salt water pool; three libraries.  Casual dining room;white table cloths.   Cough: gets headache when PND is horrible.  CAD: angina is better on Toprol XL 200mg  daily; needs  NTG.   Review of Systems  Constitutional: Negative for activity change, appetite change,  chills, diaphoresis, fatigue, fever and unexpected weight change.  HENT: Positive for congestion, postnasal drip and rhinorrhea. Negative for dental problem, drooling, ear discharge, ear pain, facial swelling, hearing loss, mouth sores, nosebleeds,  sinus pressure, sneezing, sore throat, tinnitus, trouble swallowing and voice change.   Eyes: Negative for photophobia, pain, discharge, redness, itching and visual disturbance.  Respiratory: Negative for apnea, cough, choking, chest tightness, shortness of breath, wheezing and stridor.   Cardiovascular: Negative for chest pain, palpitations and leg swelling.  Gastrointestinal: Negative for abdominal distention, abdominal pain, anal bleeding, blood in stool, constipation, diarrhea, nausea, rectal pain and vomiting.  Endocrine: Negative for cold intolerance, heat intolerance, polydipsia, polyphagia and polyuria.  Genitourinary: Positive for frequency. Negative for decreased urine volume, difficulty urinating, dyspareunia, dysuria, enuresis, flank pain, genital sores, hematuria, menstrual problem, pelvic pain, urgency, vaginal bleeding, vaginal discharge and vaginal pain.  Musculoskeletal: Positive for back pain and gait problem. Negative for arthralgias, joint swelling, myalgias, neck pain and neck stiffness.  Skin: Negative for color change, pallor, rash and wound.  Allergic/Immunologic: Negative for environmental allergies, food allergies and immunocompromised state.  Neurological: Positive for headaches. Negative for dizziness, tremors, seizures, syncope, facial asymmetry, speech difficulty, weakness, light-headedness and numbness.  Hematological: Negative for adenopathy. Does not bruise/bleed easily.  Psychiatric/Behavioral: Negative for agitation, behavioral problems, confusion, decreased concentration, dysphoric mood, hallucinations, self-injury, sleep disturbance and suicidal ideas. The patient is not nervous/anxious and is not hyperactive.     Past Medical History:  Diagnosis Date  . Anxiety   . Arthritis   . Cauda equina syndrome ([place])   . Cervical spinal stenosis   . Fibromyalgia   . Herpes labialis   . Hyperlipidemia   . Hypertension   . Lumbar spinal stenosis   . Macular  degeneration, right eye   . Migraine   . PE (pulmonary thromboembolism) ([place])   . Pulmonary embolism ([place])    age 44; not on OCP.  Coumadin x 2 years.  . Retinal vein occlusion    age 15.  Marland Kitchen Spinal stenosis of lumbar region 12/16/2015   L3-4 level   Past Surgical History:  Procedure Laterality Date  . ABDOMINAL SURGERY    . EYE SURGERY    . HAND SURGERY Left   . ROTATOR CUFF REPAIR Right    Allergies  Allergen Reactions  . Demerol [Meperidine] Anaphylaxis  . Gabapentin Other (See Comments)  . Codeine     Other reaction(s): Other (See Comments)  . Other Other (See Comments)    Suicidal ideation Alcohol (drinking kind); takes liver enzymes and elevates them  . Percocet [Oxycodone-Acetaminophen] Nausea And Vomiting   Current Outpatient Medications on File Prior to Visit  Medication Sig Dispense Refill  . acyclovir ointment (ZOVIRAX) 5 % Apply topically.    Marland Kitchen aspirin 81 MG tablet Take 81 mg by mouth daily.    Marland Kitchen atorvastatin (LIPITOR) 10 MG tablet take 1 tablet by mouth once daily 90 tablet 3  . buPROPion (WELLBUTRIN XL) 150 MG 24 hr tablet take 1 tablet by mouth once daily 90 tablet 0  . celecoxib (CELEBREX) 200 MG capsule take 1 capsule by mouth twice a day 650 capsule 3  . folic acid (FOLVITE) 1 MG tablet take 1 tablet by mouth once daily 90 tablet 3  . losartan (COZAAR) 25 MG tablet take 1 tablet by mouth once daily 90 tablet 1  . metoprolol succinate (TOPROL-XL) 100 MG 24 hr tablet Take 1-2 tablets (100-200 mg total) by mouth daily. Take with or  immediately following a meal. 180 tablet 3  . OVER THE COUNTER MEDICATION OTC B6 100 mgm taking daily    . OVER THE COUNTER MEDICATION OTC preservision areds taking 1 in the am/qhs    . OVER THE COUNTER MEDICATION OTC B12-SL taking everyday    . OVER THE COUNTER MEDICATION Reported on 04/17/2015    . oxyCODONE-acetaminophen (PERCOCET/ROXICET) 5-325 MG tablet Take 1 tablet by mouth every 6 (six) hours as needed for severe pain. 30  tablet 0  . pregabalin (LYRICA) 25 MG capsule Take 25 mg by mouth 2 (two) times daily.    Marland Kitchen tolterodine (DETROL LA) 2 MG 24 hr capsule take 1 capsule by mouth once daily 90 capsule 0  . UNABLE TO FIND Uses Lidocaine topical cream @@ night    . valACYclovir (VALTREX) 500 MG tablet Take 1 tablet (500 mg total) by mouth 2 (two) times daily. 180 tablet 3  . zolpidem (AMBIEN) 5 MG tablet take 1 tablet by mouth at bedtime if needed for sleep 30 tablet 5   No current facility-administered medications on file prior to visit.    Social History   Socioeconomic History  . Marital status: Married    Spouse name: Herbie Baltimore  . Number of children: 2  . Years of education: Therapist, sports  . Highest education level: Not on file  Social Needs  . Financial resource strain: Not on file  . Food insecurity - worry: Not on file  . Food insecurity - inability: Not on file  . Transportation needs - medical: Not on file  . Transportation needs - non-medical: Not on file  Occupational History  . 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 Smoking status: Never Smoker  . Smokeless tobacco: Never Used  Substance and Sexual Activity  . Alcohol use: Yes    Alcohol/week: 0.0 oz    Comment: 15 cc/wk  . Drug use: No  . Sexual activity: No    Partners: Male    Comment: Married  Other Topics Concern  . Not on file  Social History Narrative   Marital status:  Married x 47 years      Children: 2 children (49, 14); no granchildren      Lives: with husband, german shepard, kitkat/kitten      Employed: works once per week; has a Patent attorney one day per week; previous therapy practice in 2018.        Tobacco: never      Alcohol:  Socially; one glass of wine per week      Exercise:  Daily exercise; 3.0 miles per day      Advanced Directives:  FULL CODE; no prolonged measures      ADLs: independent with ADLs.        Right-handed   Caffeine: 2 cups of coffee per day           Family History  Problem Relation Age of Onset  . Heart attack Mother   . Stroke Mother        CVA age 40; repeat at age 34 as cause of death.  . Diabetes Mother   . Heart disease Mother 79       recurrent AMIs  . Hyperlipidemia Mother   . Hypertension Mother   . Diabetes Father   . Heart attack Father   . Cancer Father 75       lung cancer  . Hyperlipidemia Father   .  Hypertension Father   . Asthma Son        Objective:    BP 116/68   Pulse 70   Temp 98 F (36.7 C) (Oral)   Resp 16   Ht 5' 5.16" (1.655 m)   SpO2 98%   BMI 24.01 kg/m  Physical Exam  Constitutional: She is oriented to person, place, and time. She appears well-developed and well-nourished. No distress.  HENT:  Head: Normocephalic and atraumatic.  Right Ear: External ear normal.  Left Ear: External ear normal.  Nose: Nose normal.  Mouth/Throat: Oropharynx is clear and moist.  Eyes: Conjunctivae and EOM are normal. Pupils are equal, round, and reactive to light.  Neck: Normal range of motion. Neck supple. Carotid bruit is not present. No thyromegaly present.  Cardiovascular: Normal rate, regular rhythm, normal heart sounds and intact distal pulses. Exam reveals no gallop and no friction rub.  No murmur heard. Pulmonary/Chest: Effort normal and breath sounds normal. She has no wheezes. She has no rales.  Abdominal: Soft. Bowel sounds are normal. She exhibits no distension and no mass. There is no tenderness. There is no rebound and no guarding.  Lymphadenopathy:    She has no cervical adenopathy.  Neurological: She is alert and oriented to person, place, and time. No cranial nerve deficit.  Skin: Skin is warm and dry. No rash noted. She is not diaphoretic. No erythema. No pallor.  Psychiatric: She has a normal mood and affect. Her behavior is normal.   No results found. Depression screen [facility] 2/9 05/08/2017 04/03/2017 02/25/2017 01/09/2017 12/17/2016  Decreased Interest 0 0 0 0 0  Down, Depressed,  Hopeless 0 0 0 0 0  PHQ - 2 Score 0 0 0 0 0  Altered sleeping - - 3 - -  Tired, decreased energy - - 3 - -  Change in appetite - - 0 - -  Feeling bad or failure about yourself  - - 0 - -  Trouble concentrating - - 0 - -  Moving slowly or fidgety/restless - - 0 - -  Suicidal thoughts - - 0 - -  PHQ-9 Score - - 6 - -  Difficult doing work/chores - - Not difficult at all - -   Fall Risk  05/08/2017 04/03/2017 02/25/2017 01/09/2017 12/17/2016  Falls in the past year? No No Yes No No  Comment - - - - -  Number falls in past yr: - - 1 - -  Injury with Fall? - - Yes - -  Comment - - knee right - -  Risk Factor Category  - - - - -  Risk for fall due to : - - - - -  Risk for fall due to: Comment - - - - -    Functional Status Survey: Is the patient deaf or have difficulty hearing?: No Does the patient have difficulty seeing, even when wearing glasses/contacts?: No Does the patient have difficulty concentrating, remembering, or making decisions?: No Does the patient have difficulty walking or climbing stairs?: No Does the patient have difficulty dressing or bathing?: No Does the patient have difficulty doing errands alone such as visiting a doctor's office or shopping?: No     Assessment & Plan:   1. Encounter for Medicare annual wellness exam   2. Pure hypercholesterolemia   3. Essential hypertension, benign   4. Cauda equina syndrome ([place])   5. Actinic keratoses   6. Basal cell papilloma   7. Cervical spondylosis without myelopathy   8.  Lichen simplex   9. Chronic bilateral low back pain with right-sided sciatica   10. Fibromyalgia   11. Urinary incontinence without sensory awareness   12. Colon cancer screening   13. Estrogen deficiency     -anticipatory guidance provided --- exercise, weight loss, safe driving practices, aspirin 81mg  daily. -obtain age appropriate screening labs and labs for chronic disease management. -moderate fall risk; undergoing treatment for depression;  no evidence of hearing loss.  Discussed advanced directives and living will; also discussed end of life issues including code status.   Encourage application to independent living facility within area for future long-term support. -Recurrent UTI: New. Send urine cx. Hypertension: Controlled.  Obtain labs.  No changes in therapy. Hypercholesterolemia: Controlled.  Obtain labs.  No changes in therapy. Anxiety and depression: Controlled.  No changes in therapy. Cauda equina syndrome of lumbar spine: With chronic pain syndrome: Increase Cymbalta to 30mg  daily. Allergic Rhinitis: uncontrolled; start Flonase daily. CAD with angina: refill of NTG provided; well controlled with increase in Metoprolol dose.   Orders Placed This Encounter  Procedures  . Urine Culture    Order Specific Question:   Source    Answer:   repeat UTI  . DG Bone Density    Standing Status:   Future    Standing Expiration Date:   07/09/2018    Order Specific Question:   Reason for Exam (SYMPTOM  OR DIAGNOSIS REQUIRED)    Answer:   estrogen deficiency    Order Specific Question:   [facility]  . CBC with Differential/Platelet  . Lipid panel    Order Specific Question:   Has the patient fasted?    Answer:   No  . Urinalysis, dipstick only  . Comprehensive metabolic panel  . Cologuard   Meds ordered this encounter  Medications  . DULoxetine (CYMBALTA) 30 MG capsule    Sig: Take 1 capsule (30 mg total) by mouth daily.    Dispense:  90 capsule    Refill:  1  . fluticasone (FLONASE) 50 MCG/ACT nasal spray    Sig: Place 2 sprays into both nostrils daily.    Dispense:  16 g    Refill:  12  . nitroGLYCERIN (NITROSTAT) 0.4 MG SL tablet    Sig: Place 1 tablet (0.4 mg total) under the tongue every 5 (five) minutes as needed for chest pain.    Dispense:  50 tablet    Refill:  3    No Follow-up on file.   Jaeli Grubb Elayne Guerin, M.D. Primary Care at [facility] [address] [place], [place]  [address] [phone] phone [phone] fax

## 2017-05-08 NOTE — Patient Instructions (Addendum)
Increase Cymbalta 73m daily. Start Flonase nasal spray daily. I have sent in Nitroglycerin into your pharmacy. Please work on ySports administratorWill.  IF you received an x-ray today, you will receive an invoice from [facility]. Please contact [facility] at [phone] questions or concerns regarding your invoice.   IF you received labwork today, you will receive an invoice from [facility] Please contact [facility] at [phone] questions or concerns regarding your invoice.   Our billing staff will not be able to assist you with questions regarding bills from these companies.  You will be contacted with the lab results as soon as they are available. The fastest way to get your results is to activate your My Chart account. Instructions are located on the last page of this paperwork. If you have not heard from [facility] the results in 2 weeks, please contact this office.      Preventive Care 73Years and Older, Female Preventive care refers to lifestyle choices and visits with your health care provider that can promote health and wellness. What does preventive care include?  A yearly physical exam. This is also called an annual well check.  Dental exams once or twice a year.  Routine eye exams. Ask your health care provider how often you should have your eyes checked.  Personal lifestyle choices, including: ? Daily care of your teeth and gums. ? Regular physical activity. ? Eating a healthy diet. ? Avoiding tobacco and drug use. ? Limiting alcohol use. ? Practicing safe sex. ? Taking low-dose aspirin every day. ? Taking vitamin and mineral supplements as recommended by your health care provider. What happens during an annual well check? The services and screenings done by your health care provider during your annual well check will depend on your age, overall health, lifestyle risk factors, and family history of  disease. Counseling Your health care provider may ask you questions about your:  Alcohol use.  Tobacco use.  Drug use.  Emotional well-being.  Home and relationship well-being.  Sexual activity.  Eating habits.  History of falls.  Memory and ability to understand (cognition).  Work and work eStatistician  Reproductive health.  Screening You may have the following tests or measurements:  Height, weight, and BMI.  Blood pressure.  Lipid and cholesterol levels. These may be checked every 5 years, or more frequently if you are over 73years old.  Skin check.  Lung cancer screening. You may have this screening every year starting at age 73if you have a 30-pack-year history of smoking and currently smoke or have quit within the past 15 years.  Fecal occult blood test (FOBT) of the stool. You may have this test every year starting at age 73  Flexible sigmoidoscopy or colonoscopy. You may have a sigmoidoscopy every 5 years or a colonoscopy every 10 years starting at age 73  Hepatitis C blood test.  Hepatitis B blood test.  Sexually transmitted disease (STD) testing.  Diabetes screening. This is done by checking your blood sugar (glucose) after you have not eaten for a while (fasting). You may have this done every 1-3 years.  Bone density scan. This is done to screen for osteoporosis. You may have this done starting at age 73  Mammogram. This may be done every 1-2 years. Talk to your health care provider about how often you should have regular mammograms.  Talk with your health care provider about your test results, treatment options, and if necessary, the need  for more tests. Vaccines Your health care provider may recommend certain vaccines, such as:  Influenza vaccine. This is recommended every year.  Tetanus, diphtheria, and acellular pertussis (Tdap, Td) vaccine. You may need a Td booster every 10 years.  Varicella vaccine. You may need this if you have  not been vaccinated.  Zoster vaccine. You may need this after age 49.  Measles, mumps, and rubella (MMR) vaccine. You may need at least one dose of MMR if you were born in 1957 or later. You may also need a second dose.  Pneumococcal 13-valent conjugate (PCV13) vaccine. One dose is recommended after age 31.  Pneumococcal polysaccharide (PPSV23) vaccine. One dose is recommended after age 42.  Meningococcal vaccine. You may need this if you have certain conditions.  Hepatitis A vaccine. You may need this if you have certain conditions or if you travel or work in places where you may be exposed to hepatitis A.  Hepatitis B vaccine. You may need this if you have certain conditions or if you travel or work in places where you may be exposed to hepatitis B.  Haemophilus influenzae type b (Hib) vaccine. You may need this if you have certain conditions.  Talk to your health care provider about which screenings and vaccines you need and how often you need them. This information is not intended to replace advice given to you by your health care provider. Make sure you discuss any questions you have with your health care provider. Document Released: 03/20/2015 Document Revised: 11/11/2015 Document Reviewed: 12/23/2014 Elsevier Interactive Patient Education  [employer].

## 2017-05-08 NOTE — Progress Notes (Deleted)
Subjective:    Patient ID: Kaylee Weiss, female    DOB: 12/09/1945, 72 y.o.   MRN: [id number]  05/08/2017  Medicare Wellness    HPI This 72 y.o. female presents for evaluation of ***. BP Readings from Last 3 Encounters:  05/08/17 116/68  04/03/17 126/78  02/25/17 112/68   Wt Readings from Last 3 Encounters:  12/03/16 145 lb (65.8 kg)  06/29/16 143 lb (64.9 kg)  06/01/16 144 lb (65.3 kg)   Immunization History  Administered Date(s) Administered  . Influenza,inj,Quad PF,6+ Mos 11/07/2014, 12/07/2015, 12/03/2016  . Influenza-Unspecified 12/05/2013  . Pneumococcal Conjugate-13 03/07/2014  . Pneumococcal Polysaccharide-23 11/17/2015  . Pneumococcal-Unspecified 03/07/2010  . Td 02/12/2014  . Zoster 03/07/2005  . Zoster Recombinat (Shingrix) 07/15/2016    Review of Systems  Past Medical History:  Diagnosis Date  . Anxiety   . Arthritis   . Cauda equina syndrome ([place])   . Cervical spinal stenosis   . Fibromyalgia   . Herpes labialis   . Hyperlipidemia   . Hypertension   . Lumbar spinal stenosis   . Macular degeneration, right eye   . Migraine   . PE (pulmonary thromboembolism) ([place])   . Pulmonary embolism ([place])    age 49; not on OCP.  Coumadin x 2 years.  . Retinal vein occlusion    age 61.  Marland Kitchen Spinal stenosis of lumbar region 12/16/2015   L3-4 level   Past Surgical History:  Procedure Laterality Date  . ABDOMINAL SURGERY    . EYE SURGERY    . HAND SURGERY Left   . ROTATOR CUFF REPAIR Right    Allergies  Allergen Reactions  . Demerol [Meperidine] Anaphylaxis  . Gabapentin Other (See Comments)  . Codeine     Other reaction(s): Other (See Comments)  . Other Other (See Comments)    Suicidal ideation Alcohol (drinking kind); takes liver enzymes and elevates them  . Percocet [Oxycodone-Acetaminophen] Nausea And Vomiting   Current Outpatient Medications on File Prior to Visit  Medication Sig Dispense Refill  . acyclovir ointment (ZOVIRAX) 5 % Apply  topically.    Marland Kitchen aspirin 81 MG tablet Take 81 mg by mouth daily.    Marland Kitchen atorvastatin (LIPITOR) 10 MG tablet take 1 tablet by mouth once daily 90 tablet 3  . buPROPion (WELLBUTRIN XL) 150 MG 24 hr tablet take 1 tablet by mouth once daily 90 tablet 0  . celecoxib (CELEBREX) 200 MG capsule take 1 capsule by mouth twice a day 180 capsule 3  . DULoxetine (CYMBALTA) 20 MG capsule Take 1 capsule (20 mg total) by mouth daily. 90 capsule 1  . folic acid (FOLVITE) 1 MG tablet take 1 tablet by mouth once daily 90 tablet 3  . losartan (COZAAR) 25 MG tablet take 1 tablet by mouth once daily 90 tablet 1  . metoprolol succinate (TOPROL-XL) 100 MG 24 hr tablet Take 1-2 tablets (100-200 mg total) by mouth daily. Take with or immediately following a meal. 180 tablet 3  . OVER THE COUNTER MEDICATION OTC B6 100 mgm taking daily    . OVER THE COUNTER MEDICATION OTC preservision areds taking 1 in the am/qhs    . OVER THE COUNTER MEDICATION OTC B12-SL taking everyday    . OVER THE COUNTER MEDICATION Reported on 04/17/2015    . oxyCODONE-acetaminophen (PERCOCET/ROXICET) 5-325 MG tablet Take 1 tablet by mouth every 6 (six) hours as needed for severe pain. 30 tablet 0  . pregabalin (LYRICA) 25 MG capsule Take 25 mg by  mouth 2 (two) times daily.    Marland Kitchen tolterodine (DETROL LA) 2 MG 24 hr capsule take 1 capsule by mouth once daily 90 capsule 0  . UNABLE TO FIND Uses Lidocaine topical cream @@ night    . valACYclovir (VALTREX) 500 MG tablet Take 1 tablet (500 mg total) by mouth 2 (two) times daily. 180 tablet 3  . zolpidem (AMBIEN) 5 MG tablet take 1 tablet by mouth at bedtime if needed for sleep 30 tablet 5   No current facility-administered medications on file prior to visit.    Social History   Socioeconomic History  . Marital status: Married    Spouse name: Herbie Baltimore  . Number of children: 2  . Years of education: Therapist, sports  . Highest education level: Not on file  Social Needs  . Financial resource strain: Not on file  . Food  insecurity - worry: Not on file  . Food insecurity - inability: Not on file  . Transportation needs - medical: Not on file  . Transportation needs - non-medical: Not on file  Occupational History  . 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 Smoking status: Never Smoker  . Smokeless tobacco: Never Used  Substance and Sexual Activity  . Alcohol use: Yes    Alcohol/week: 0.0 oz    Comment: 15 cc/wk  . Drug use: No  . Sexual activity: No    Partners: Male    Comment: Married  Other Topics Concern  . Not on file  Social History Narrative   Marital status:  Married x 47 years      Children: 2 children (49, 73); no granchildren      Lives: with husband, german shepard, kitkat/kitten      Employed: works once per week; has a Patent attorney one day per week; previous therapy practice in 2018.        Tobacco: never      Alcohol:  Socially; one glass of wine per week      Exercise:  Daily exercise; 3.0 miles per day      Advanced Directives:  FULL CODE; no prolonged measures      ADLs: independent with ADLs.        Right-handed   Caffeine: 2 cups of coffee per day         Family History  Problem Relation Age of Onset  . Heart attack Mother   . Stroke Mother        CVA age 73; repeat at age 51 as cause of death.  . Diabetes Mother   . Heart disease Mother 68       recurrent AMIs  . Hyperlipidemia Mother   . Hypertension Mother   . Diabetes Father   . Heart attack Father   . Cancer Father 89       lung cancer  . Hyperlipidemia Father   . Hypertension Father   . Asthma Son        Objective:    BP 116/68   Pulse 70   Temp 98 F (36.7 C) (Oral)   Resp 16   Ht 5' 5.16" (1.655 m)   SpO2 98%   BMI 24.01 kg/m  Physical Exam No results found. Depression screen [facility] 2/9 05/08/2017 04/03/2017 02/25/2017 01/09/2017 12/17/2016  Decreased Interest 0 0 0 0 0  Down, Depressed, Hopeless 0 0 0 0 0  PHQ - 2 Score 0 0 0 0 0  Altered sleeping - - 3 - -  Tired, decreased energy - - 3 - -  Change in appetite - - 0 - -  Feeling bad or failure about yourself  - - 0 - -  Trouble concentrating - - 0 - -  Moving slowly or fidgety/restless - - 0 - -  Suicidal thoughts - - 0 - -  PHQ-9 Score - - 6 - -  Difficult doing work/chores - - Not difficult at all - -   Fall Risk  05/08/2017 04/03/2017 02/25/2017 01/09/2017 12/17/2016  Falls in the past year? No No Yes No No  Comment - - - - -  Number falls in past yr: - - 1 - -  Injury with Fall? - - Yes - -  Comment - - knee right - -  Risk Factor Category  - - - - -  Risk for fall due to : - - - - -  Risk for fall due to: Comment - - - - -        Assessment & Plan:   1. Pure hypercholesterolemia   2. Essential hypertension, benign     ***  No orders of the defined types were placed in this encounter.  No orders of the defined types were placed in this encounter.   No Follow-up on file.   Imanol Bihl Elayne Guerin, M.D. Primary Care at [facility] previously [facility] [address] [place], [place]  [address] [phone] phone [phone] fax

## 2017-05-09 LAB — URINALYSIS, DIPSTICK ONLY
Bilirubin, UA: NEGATIVE
Glucose, UA: NEGATIVE
KETONES UA: NEGATIVE
NITRITE UA: NEGATIVE
Protein, UA: NEGATIVE
RBC UA: NEGATIVE
Specific Gravity, UA: 1.023 (ref 1.005–1.030)
UUROB: 1 mg/dL (ref 0.2–1.0)
pH, UA: 7 (ref 5.0–7.5)

## 2017-05-09 LAB — CBC WITH DIFFERENTIAL/PLATELET
Basophils Absolute: 0 10*3/uL (ref 0.0–0.2)
Basos: 0 %
EOS (ABSOLUTE): 0.2 10*3/uL (ref 0.0–0.4)
Eos: 3 %
Hematocrit: 42.4 % (ref 34.0–46.6)
Hemoglobin: 14.3 g/dL (ref 11.1–15.9)
IMMATURE GRANULOCYTES: 0 %
Immature Grans (Abs): 0 10*3/uL (ref 0.0–0.1)
LYMPHS ABS: 2.8 10*3/uL (ref 0.7–3.1)
Lymphs: 35 %
MCH: 30.8 pg (ref 26.6–33.0)
MCHC: 33.7 g/dL (ref 31.5–35.7)
MCV: 91 fL (ref 79–97)
Monocytes Absolute: 0.6 10*3/uL (ref 0.1–0.9)
Monocytes: 8 %
Neutrophils Absolute: 4.3 10*3/uL (ref 1.4–7.0)
Neutrophils: 54 %
Platelets: 234 10*3/uL (ref 150–379)
RBC: 4.64 x10E6/uL (ref 3.77–5.28)
RDW: 15.3 % (ref 12.3–15.4)
WBC: 8 10*3/uL (ref 3.4–10.8)

## 2017-05-09 LAB — COMPREHENSIVE METABOLIC PANEL
ALT: 30 IU/L (ref 0–32)
AST: 27 IU/L (ref 0–40)
Albumin/Globulin Ratio: 1.9 (ref 1.2–2.2)
Albumin: 4.1 g/dL (ref 3.5–4.8)
Alkaline Phosphatase: 113 IU/L (ref 39–117)
BUN/Creatinine Ratio: 18 (ref 12–28)
BUN: 15 mg/dL (ref 8–27)
Bilirubin Total: 0.3 mg/dL (ref 0.0–1.2)
CALCIUM: 9.2 mg/dL (ref 8.7–10.3)
CO2: 24 mmol/L (ref 20–29)
CREATININE: 0.84 mg/dL (ref 0.57–1.00)
Chloride: 104 mmol/L (ref 96–106)
GFR calc Af Amer: 80 mL/min/{1.73_m2} (ref >59)
GFR, EST NON AFRICAN AMERICAN: 70 mL/min/{1.73_m2} (ref >59)
GLUCOSE: 92 mg/dL (ref 65–99)
Globulin, Total: 2.2 g/dL (ref 1.5–4.5)
POTASSIUM: 4.4 mmol/L (ref 3.5–5.2)
Sodium: 142 mmol/L (ref 134–144)
Total Protein: 6.3 g/dL (ref 6.0–8.5)

## 2017-05-09 LAB — LIPID PANEL
CHOL/HDL RATIO: 3.2 ratio (ref 0.0–4.4)
Cholesterol, Total: 152 mg/dL (ref 100–199)
HDL: 47 mg/dL (ref >39)
LDL Calculated: 55 mg/dL (ref 0–99)
TRIGLYCERIDES: 250 mg/dL — AB (ref 0–149)
VLDL CHOLESTEROL CAL: 50 mg/dL — AB (ref 5–40)

## 2017-05-09 LAB — URINE CULTURE

## 2017-05-11 ENCOUNTER — Telehealth: Payer: Self-pay

## 2017-05-11 NOTE — Telephone Encounter (Signed)
PA started for Zolpidem 5 mg Awaiting Response  Key [id number]   [facility] is reviewing your PA request and will respond within 24-72 hours. To check for an update later, open this request from your dashboard.

## 2017-05-17 DIAGNOSIS — H35371 Puckering of macula, right eye: Secondary | ICD-10-CM | POA: Diagnosis not present

## 2017-05-17 DIAGNOSIS — H353132 Nonexudative age-related macular degeneration, bilateral, intermediate dry stage: Secondary | ICD-10-CM | POA: Diagnosis not present

## 2017-05-17 DIAGNOSIS — H43813 Vitreous degeneration, bilateral: Secondary | ICD-10-CM | POA: Diagnosis not present

## 2017-05-17 DIAGNOSIS — Q141 Congenital malformation of retina: Secondary | ICD-10-CM | POA: Diagnosis not present

## 2017-05-19 DIAGNOSIS — M545 Low back pain: Secondary | ICD-10-CM | POA: Diagnosis not present

## 2017-05-19 NOTE — Telephone Encounter (Signed)
PA for Zolpidem 5 mg Denided.  Must try/fail Rozerem, Silenor first.  I will place denial letter in box today.

## 2017-06-06 ENCOUNTER — Other Ambulatory Visit: Payer: Self-pay | Admitting: Family Medicine

## 2017-06-06 DIAGNOSIS — N3289 Other specified disorders of bladder: Secondary | ICD-10-CM

## 2017-06-08 MED ORDER — AMOXICILLIN-POT CLAVULANATE 875-125 MG PO TABS: 1.0000 | ORAL_TABLET | Freq: Two times a day (BID) | ORAL | 0 refills | Status: DC

## 2017-06-08 NOTE — Addendum Note (Signed)
Addended by: Wardell Honour on: 06/08/2017 03:47 PM   Modules accepted: Orders

## 2017-06-09 DIAGNOSIS — Z1211 Encounter for screening for malignant neoplasm of colon: Secondary | ICD-10-CM | POA: Diagnosis not present

## 2017-06-09 DIAGNOSIS — Z1212 Encounter for screening for malignant neoplasm of rectum: Secondary | ICD-10-CM | POA: Diagnosis not present

## 2017-06-20 ENCOUNTER — Telehealth: Payer: Self-pay

## 2017-06-20 NOTE — Telephone Encounter (Signed)
Contacted pt. Pt states she does not need to come in for OV but just wanted to make you aware and see you had any advice. Pain is tolerable. Pt just wants validation that she is getting worse and that it is ok that she does not ambulate as much as she used to. Pt is ok with mychart message.   Copied from [place] [id number]. Topic: Inquiry >> Jun 19, 2017  3:21 PM Oliver Pila B wrote: Reason for CRM: pt called and would like to have her pcp or the nurse to contact her, pt states she is having more diffulculty walking and since an appt is far out to see her pcp the pt would like to be contacted by her pcp or her nurse to discuss whats been going on, call pt to advise

## 2017-06-23 LAB — COLOGUARD: Cologuard: NEGATIVE

## 2017-07-06 ENCOUNTER — Ambulatory Visit
Admission: RE | Admit: 2017-07-06 | Discharge: 2017-07-06 | Disposition: A | Discharge status: Home / Self Care | Payer: Medicare Other | Source: Ambulatory Visit | Attending: Family Medicine | Admitting: Family Medicine

## 2017-07-06 DIAGNOSIS — Z780 Asymptomatic menopausal state: Secondary | ICD-10-CM | POA: Diagnosis not present

## 2017-07-06 DIAGNOSIS — M810 Age-related osteoporosis without current pathological fracture: Secondary | ICD-10-CM | POA: Diagnosis not present

## 2017-07-06 DIAGNOSIS — E2839 Other primary ovarian failure: Secondary | ICD-10-CM

## 2017-07-15 ENCOUNTER — Ambulatory Visit (INDEPENDENT_AMBULATORY_CARE_PROVIDER_SITE_OTHER): Payer: Medicare Other | Admitting: Family Medicine

## 2017-07-15 ENCOUNTER — Encounter: Payer: Self-pay | Admitting: Family Medicine

## 2017-07-15 ENCOUNTER — Other Ambulatory Visit: Payer: Self-pay

## 2017-07-15 VITALS — BP 108/76 | HR 61 | Temp 98.0°F | Resp 16 | Ht 65.0 in

## 2017-07-15 DIAGNOSIS — N200 Calculus of kidney: Secondary | ICD-10-CM

## 2017-07-15 DIAGNOSIS — I2510 Atherosclerotic heart disease of native coronary artery without angina pectoris: Secondary | ICD-10-CM | POA: Diagnosis not present

## 2017-07-15 DIAGNOSIS — G834 Cauda equina syndrome: Secondary | ICD-10-CM | POA: Diagnosis not present

## 2017-07-15 DIAGNOSIS — M48062 Spinal stenosis, lumbar region with neurogenic claudication: Secondary | ICD-10-CM

## 2017-07-15 DIAGNOSIS — M85852 Other specified disorders of bone density and structure, left thigh: Secondary | ICD-10-CM | POA: Diagnosis not present

## 2017-07-15 DIAGNOSIS — M85851 Other specified disorders of bone density and structure, right thigh: Secondary | ICD-10-CM

## 2017-07-15 DIAGNOSIS — K8020 Calculus of gallbladder without cholecystitis without obstruction: Secondary | ICD-10-CM

## 2017-07-15 NOTE — Patient Instructions (Addendum)
   [facility] as of August 2019.  IF you received an x-ray today, you will receive an invoice from [facility]. Please contact [facility] at [phone] with questions or concerns regarding your invoice.   IF you received labwork today, you will receive an invoice from [place]. Please contact [facility] at [phone] with questions or concerns regarding your invoice.   Our billing staff will not be able to assist you with questions regarding bills from these companies.  You will be contacted with the lab results as soon as they are available. The fastest way to get your results is to activate your My Chart account. Instructions are located on the last page of this paperwork. If you have not heard from [place] regarding the results in 2 weeks, please contact this office.

## 2017-07-15 NOTE — Progress Notes (Signed)
Subjective:    Patient ID: Kaylee Weiss, female    DOB: [birth year], 72 y.o.   MRN: [id number]  07/15/2017  Right Leg Issues (pt states she would like to follow-up with issue)    HPI This 72 y.o. female presents for two month follow-up of DDD lumbar spine with cauda equina syndrome, HTN, hypercholesterolemia, anxiety/depression. Found a cousin; went to high school together; never shared with patient.  Mother was terrible.  Niece with two children.  Lives in [place], [place] suggested that pain will accelerate at times.  Rested during this period. No longer can walk in afternoons.  Can walk in morning.  Friend was using TENS unit from neighbor.  TENS unit expired in 1997.  Very complicated.  At that time, rented TENS unit prescribed by physical therapist.   Has been using TENS unit.  Very useful; lasts for one day.  Really got irritated with insurance; refused to cover Ambien.  Tapered off of Ambien; tapered off of Melatonin.  Getting 3-4 hours of sleep per night.  Husband's gasping apnea awakens patient.  R pain leg is higher; bubbles are less but pain is worse with needle and pins; feels like bee stings; electrical zaps.  Really hurt.  Bubbles on LEFT side. Definitely progression.  PCP recommended returning to orthopedist.  Pt does not feel that steriod injection will help. Percocet causes horrible agitation. Getting into bed and out of bed are the two worst events.   Lidoderm patches don't help.  Gets a placebo effect on everything.   Duration of 4-6 weeks of severe pain;.   Metoprolol 200mg  daily. Losartan 25mg  daily. Lyrica 25mg  qhs; Cymbalta 30mg  every morning.  Lots of energy all day.  Doing a lot more.   Black mailed patient into taking over the [employer].  Walking 2-3 miles three times per week.   Gallstones: Patient would like to undergo further evaluation regarding gallstones.  Patient would prefer to have elective surgery instead of emergent surgery as she  ages.  Kidney stones: Patient also with kidney stone detected on imaging studies.  Patient would like to undergo consultation regarding kidney stones.  Would like to have stone electively removed instead of emergently removed.  Denies hematuria.   BP Readings from Last 3 Encounters:  09/22/17 132/82  09/20/17 132/82  09/11/17 138/75   Wt Readings from Last 3 Encounters:  09/11/17 145 lb (65.8 kg)  12/03/16 145 lb (65.8 kg)  06/29/16 143 lb (64.9 kg)   Immunization History  Administered Date(s) Administered  . Influenza,inj,Quad PF,6+ Mos 11/07/2014, 12/07/2015, 12/03/2016  . Influenza-Unspecified 12/05/2013  . Pneumococcal Conjugate-13 03/07/2014  . Pneumococcal Polysaccharide-23 11/17/2015  . Pneumococcal-Unspecified 03/07/2010  . Td 02/12/2014  . Zoster 03/07/2005  . Zoster Recombinat (Shingrix) 07/15/2016    Review of Systems  Constitutional: Negative for chills, diaphoresis, fatigue and fever.  Eyes: Negative for visual disturbance.  Respiratory: Negative for cough and shortness of breath.   Cardiovascular: Negative for chest pain, palpitations and leg swelling.  Gastrointestinal: Negative for abdominal distention, abdominal pain, anal bleeding, blood in stool, constipation, diarrhea, nausea, rectal pain and vomiting.  Endocrine: Negative for cold intolerance, heat intolerance, polydipsia, polyphagia and polyuria.  Genitourinary: Positive for frequency and urgency. Negative for enuresis, flank pain and hematuria.  Musculoskeletal: Positive for back pain and gait problem.  Neurological: Positive for weakness and numbness. Negative for dizziness, tremors, seizures, syncope, facial asymmetry, speech difficulty, light-headedness and headaches.  Psychiatric/Behavioral: Positive for dysphoric mood. Negative for  self-injury, sleep disturbance and suicidal ideas. The patient is nervous/anxious.     Past Medical History:  Diagnosis Date  . Anxiety   . Arthritis   . Cauda equina  syndrome ([place])   . Cervical spinal stenosis   . Fibromyalgia   . Herpes labialis   . Hyperlipidemia   . Hypertension   . Lumbar spinal stenosis   . Macular degeneration, right eye   . Migraine   . PE (pulmonary thromboembolism) ([place])   . Pulmonary embolism ([place])    age 51; not on OCP.  Coumadin x 2 years.  . Retinal vein occlusion    age 73.  Marland Kitchen Spinal stenosis of lumbar region 12/16/2015   L3-4 level   Past Surgical History:  Procedure Laterality Date  . ABDOMINAL SURGERY    . EYE SURGERY    . HAND SURGERY Left   . ROTATOR CUFF REPAIR Right    Allergies  Allergen Reactions  . Demerol [Meperidine] Anaphylaxis  . Gabapentin Other (See Comments)  . Codeine     Other reaction(s): Other (See Comments)  . Other Other (See Comments)    Suicidal ideation Alcohol (drinking kind); takes liver enzymes and elevates them  . Percocet [Oxycodone-Acetaminophen] Nausea And Vomiting   Current Outpatient Medications on File Prior to Visit  Medication Sig Dispense Refill  . acyclovir ointment (ZOVIRAX) 5 % Apply topically.    Marland Kitchen aspirin 81 MG tablet Take 81 mg by mouth daily.    Marland Kitchen atorvastatin (LIPITOR) 10 MG tablet take 1 tablet by mouth once daily 90 tablet 3  . celecoxib (CELEBREX) 200 MG capsule take 1 capsule by mouth twice a day 180 capsule 3  . DULoxetine (CYMBALTA) 30 MG capsule Take 1 capsule (30 mg total) by mouth daily. 90 capsule 1  . fluticasone (FLONASE) 50 MCG/ACT nasal spray Place 2 sprays into both nostrils daily. 16 g 12  . folic acid (FOLVITE) 1 MG tablet take 1 tablet by mouth once daily 90 tablet 3  . nitroGLYCERIN (NITROSTAT) 0.4 MG SL tablet Place 1 tablet (0.4 mg total) under the tongue every 5 (five) minutes as needed for chest pain. 50 tablet 3  . OVER THE COUNTER MEDICATION OTC B6 100 mgm taking daily    . OVER THE COUNTER MEDICATION OTC preservision areds taking 1 in the am/qhs    . OVER THE COUNTER MEDICATION OTC B12-SL taking everyday    . OVER THE COUNTER  MEDICATION Reported on 04/17/2015    . oxyCODONE-acetaminophen (PERCOCET/ROXICET) 5-325 MG tablet Take 1 tablet by mouth every 6 (six) hours as needed for severe pain. 30 tablet 0  . pregabalin (LYRICA) 25 MG capsule Take 25 mg by mouth 2 (two) times daily.    Marland Kitchen UNABLE TO FIND Uses Lidocaine topical cream @@ night    . valACYclovir (VALTREX) 500 MG tablet Take 1 tablet (500 mg total) by mouth 2 (two) times daily. 180 tablet 3   No current facility-administered medications on file prior to visit.    Social History   Socioeconomic History  . Marital status: Married    Spouse name: Herbie Baltimore  . Number of children: 2  . Years of education: Therapist, sports  . Highest education level: Not on file  Occupational History  . 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 Financial resource strain: Not on file  . Food insecurity:    Worry: Not on file    Inability: Not  on file  . Transportation needs:    Medical: Not on file    Non-medical: Not on file  Tobacco Use  . Smoking status: Never Smoker  . Smokeless tobacco: Never Used  Substance and Sexual Activity  . Alcohol use: Yes    Alcohol/week: 0.0 oz    Comment: 15 cc/wk  . Drug use: No  . Sexual activity: Never    Partners: Male    Comment: Married  Lifestyle  . Physical activity:    Days per week: Not on file    Minutes per session: Not on file  . Stress: Not on file  Relationships  . Social connections:    Talks on phone: Not on file    Gets together: Not on file    Attends religious service: Not on file    Active member of club or organization: Not on file    Attends meetings of clubs or organizations: Not on file    Relationship status: Not on file  . Intimate partner violence:    Fear of current or ex partner: Not on file    Emotionally abused: Not on file    Physically abused: Not on file    Forced sexual activity: Not on file  Other Topics Concern  . Not on file  Social History  Narrative   Marital status:  Married x 47 years      Children: 2 children (49, 52); no granchildren      Lives: with husband, german shepard, kitkat/kitten      Employed: works once per week; has a Patent attorney one day per week; previous therapy practice in 2018.        Tobacco: never      Alcohol:  Socially; one glass of wine per week      Exercise:  Daily exercise; 3.0 miles per day      Advanced Directives:  FULL CODE; no prolonged measures      ADLs: independent with ADLs.        Right-handed   Caffeine: 2 cups of coffee per day         Family History  Problem Relation Age of Onset  . Heart attack Mother   . Stroke Mother        CVA age 29; repeat at age 30 as cause of death.  . Diabetes Mother   . Heart disease Mother 33       recurrent AMIs  . Hyperlipidemia Mother   . Hypertension Mother   . Diabetes Father   . Heart attack Father   . Cancer Father 79       lung cancer  . Hyperlipidemia Father   . Hypertension Father   . Asthma Son        Objective:    BP 108/76   Pulse 61   Temp 98 F (36.7 C) (Oral)   Resp 16   Ht 5\' 5"  (1.651 m)   SpO2 97%   BMI 24.13 kg/m  Physical Exam  Constitutional: She is oriented to person, place, and time. She appears well-developed and well-nourished. No distress.  HENT:  Head: Normocephalic and atraumatic.  Right Ear: External ear normal.  Left Ear: External ear normal.  Nose: Nose normal.  Mouth/Throat: Oropharynx is clear and moist.  Eyes: Pupils are equal, round, and reactive to light. Conjunctivae and EOM are normal.  Neck: Normal range of motion. Neck supple. Carotid bruit is not present. No thyromegaly present.  Cardiovascular: Normal rate, regular rhythm,  normal heart sounds and intact distal pulses. Exam reveals no gallop and no friction rub.  No murmur heard. Pulmonary/Chest: Effort normal and breath sounds normal. She has no wheezes. She has no rales.  Abdominal: Soft. Bowel sounds are normal. She exhibits  no distension and no mass. There is no tenderness. There is no rebound and no guarding.  Musculoskeletal:       Lumbar back: She exhibits decreased range of motion and pain. She exhibits no tenderness, no bony tenderness, no spasm and normal pulse.  Lymphadenopathy:    She has no cervical adenopathy.  Neurological: She is alert and oriented to person, place, and time. No cranial nerve deficit. She exhibits normal muscle tone. Coordination normal.  Skin: Skin is warm and dry. No rash noted. She is not diaphoretic. No erythema. No pallor.  Psychiatric: She has a normal mood and affect. Her behavior is normal.   No results found. Depression screen [facility] 2/9 09/22/2017 09/20/2017 08/14/2017 07/15/2017 05/08/2017  Decreased Interest 0 0 1 0 0  Down, Depressed, Hopeless 0 0 1 0 0  PHQ - 2 Score 0 0 2 0 0  Altered sleeping - - 0 - -  Tired, decreased energy - - 0 - -  Change in appetite - - 0 - -  Feeling bad or failure about yourself  - - 0 - -  Trouble concentrating - - 0 - -  Moving slowly or fidgety/restless - - 0 - -  Suicidal thoughts - - 0 - -  PHQ-9 Score - - 2 - -  Difficult doing work/chores - - - - -  Some recent data might be hidden   Fall Risk  09/22/2017 09/20/2017 08/14/2017 07/15/2017 05/08/2017  Falls in the past year? No No No No No  Comment - - - - -  Number falls in past yr: - - - - -  Injury with Fall? - - - - -  Comment - - - - -  Risk Factor Category  - - - - -  Risk for fall due to : - - - - -  Risk for fall due to: Comment - - - - -        Assessment & Plan:   1. Spinal stenosis of lumbar region with neurogenic claudication   2. Cauda equina syndrome (HCC)   3. Calculus of gallbladder without cholecystitis without obstruction   4. Nephrolithiasis   5. Osteopenia of both hips     Spinal stenosis lumbar with cauda equina syndrome/chronic pain syndrome: acutely worsening pain; encourage follow-up with [facility] to discuss future treatment plan including injections, etc.   Pt cannot tolerate higher doses of cymbalta or lyrica.  Also does not want to increase frequency or use of Percocet therapy; not a surgical candidate per NS.  Cholelithiasis:  Asymptomatic; repeat abdominal US.  Nephrolithiasis: asymptomatic; obtain abdominal US.    Orders Placed This Encounter  Procedures  . US Abdomen Complete    07/26/17 **DO NOT USE//[facility] Epic order//physician requested [place] abd comp//[facility] 07/17/17 Wt-145/not diab/no needs Ins-mcr/bcbs    Standing Status:   Future    Standing Expiration Date:   09/15/2018    Order Specific Question:   Reason for Exam (SYMPTOM  OR DIAGNOSIS REQUIRED)    Answer:   gallstones; kidney stones    Order Specific Question:   Preferred imaging location?    Answer:   [facility]   No orders of the defined types were placed in this encounter.  Return in about 2 months (around 09/14/2017) for follow-up chronic medical conditions.   Ronie Fleeger Elayne Guerin, M.D. Primary Care at [facility] previously Urgent [place] [address] [place], [place]  [address] [phone] phone [phone] fax

## 2017-07-17 ENCOUNTER — Telehealth: Payer: Self-pay | Admitting: Family Medicine

## 2017-07-17 NOTE — Telephone Encounter (Signed)
Arena from [facility] called, she stated that they are unable to see the kidneys in an u/s for kidney stones that it would need to be a CT abdomen and plevis without contrast so that they can see all. Thank You!

## 2017-07-18 ENCOUNTER — Telehealth: Payer: Self-pay | Admitting: Family Medicine

## 2017-07-18 ENCOUNTER — Other Ambulatory Visit: Payer: Self-pay | Admitting: Family Medicine

## 2017-07-18 DIAGNOSIS — K838 Other specified diseases of biliary tract: Secondary | ICD-10-CM

## 2017-07-18 DIAGNOSIS — K8070 Calculus of gallbladder and bile duct without cholecystitis without obstruction: Secondary | ICD-10-CM

## 2017-07-18 NOTE — Telephone Encounter (Signed)
Copied from [place] [id number]. Topic: Quick Communication - See Telephone Encounter >> Jul 18, 2017  2:05 PM Cleaster Corin, NT wrote: CRM for notification. See Telephone encounter for: 07/18/17.  Pt. Needing order for ct scan order instead of ultra sound. Pt. Stated that order needs to be sent to the [facility]

## 2017-07-18 NOTE — Telephone Encounter (Signed)
Noted.  I am also wanting to visualize gallbladder.  Proceed with abdominal US.

## 2017-07-18 NOTE — Telephone Encounter (Signed)
Provider, please advise.  

## 2017-07-19 ENCOUNTER — Telehealth: Payer: Self-pay | Admitting: Family Medicine

## 2017-07-19 NOTE — Telephone Encounter (Unsigned)
Copied from [place] [phone]. Topic: Quick Communication - See Telephone Encounter >> Jul 18, 2017  2:05 PM Cleaster Corin, NT wrote: CRM for notification. See Telephone encounter for: 07/18/17.  Pt. Needing order for ct scan order instead of ultra sound. Pt. Stated that order needs to be sent to the [facility]

## 2017-07-19 NOTE — Telephone Encounter (Signed)
Is this typical? Should I get more clarification on this?

## 2017-07-19 NOTE — Telephone Encounter (Signed)
Talked to [place] at [facility] again and she stated since you would like to continue with the u/s adb complete that a new order would need to be put in without the diagnosis n20.0 nephrolithiasis attached to order because they are unable to see the kidneys. Once that is done she will call pt to schedule Lonzo Candy stated that pt is very anxious.

## 2017-07-25 NOTE — Telephone Encounter (Signed)
I have reordered abdominal ultrasound and associated only with cholelithiasis.  I see abdominal US is scheduled for 08/02/17.  Please call radiology facility to confirm there are no other barriers to performing abdominal US.  Also call patient to confirm she understands that the abdominal US is the appropriate next step and not the CT abdomen.

## 2017-07-25 NOTE — Telephone Encounter (Signed)
No.  I have communicated with the radiology facility that I am looking for gallstones and kidney stones.  Please proceed with ultrasound abdomen first.  Please contact radiology facility. It appears that abdominal ultrasound is scheduled for 08/02/17.

## 2017-07-26 ENCOUNTER — Other Ambulatory Visit: Payer: BLUE CROSS/BLUE SHIELD

## 2017-07-26 NOTE — Telephone Encounter (Signed)
LEFT MESSAGE FOR PATIENT NO RELEASE.  Please left patient know the abdominal ultrasound is what patient needs,  [facility] has been called to confirm this and orders are in correctly for her upcoming appointment.

## 2017-07-26 NOTE — Telephone Encounter (Signed)
Spoke with the facility everything looks good for the scheduled appointment on 5/29

## 2017-08-02 ENCOUNTER — Other Ambulatory Visit: Payer: BLUE CROSS/BLUE SHIELD

## 2017-08-02 ENCOUNTER — Ambulatory Visit
Admission: RE | Admit: 2017-08-02 | Discharge: 2017-08-02 | Disposition: A | Discharge status: Home / Self Care | Payer: Medicare Other | Source: Ambulatory Visit | Attending: Family Medicine | Admitting: Family Medicine

## 2017-08-02 ENCOUNTER — Encounter: Payer: Self-pay | Admitting: Family Medicine

## 2017-08-02 DIAGNOSIS — K8020 Calculus of gallbladder without cholecystitis without obstruction: Secondary | ICD-10-CM | POA: Diagnosis not present

## 2017-08-02 DIAGNOSIS — K8070 Calculus of gallbladder and bile duct without cholecystitis without obstruction: Secondary | ICD-10-CM

## 2017-08-02 DIAGNOSIS — K838 Other specified diseases of biliary tract: Secondary | ICD-10-CM

## 2017-08-14 ENCOUNTER — Other Ambulatory Visit: Payer: Self-pay

## 2017-08-14 ENCOUNTER — Encounter: Payer: Self-pay | Admitting: Family Medicine

## 2017-08-14 ENCOUNTER — Ambulatory Visit (INDEPENDENT_AMBULATORY_CARE_PROVIDER_SITE_OTHER): Payer: Medicare Other | Admitting: Family Medicine

## 2017-08-14 VITALS — BP 122/82 | HR 71 | Temp 98.0°F | Resp 16 | Ht 65.35 in

## 2017-08-14 DIAGNOSIS — N200 Calculus of kidney: Secondary | ICD-10-CM

## 2017-08-14 DIAGNOSIS — K8070 Calculus of gallbladder and bile duct without cholecystitis without obstruction: Secondary | ICD-10-CM | POA: Diagnosis not present

## 2017-08-14 DIAGNOSIS — M5441 Lumbago with sciatica, right side: Secondary | ICD-10-CM

## 2017-08-14 DIAGNOSIS — K838 Other specified diseases of biliary tract: Secondary | ICD-10-CM | POA: Diagnosis not present

## 2017-08-14 DIAGNOSIS — I10 Essential (primary) hypertension: Secondary | ICD-10-CM | POA: Diagnosis not present

## 2017-08-14 DIAGNOSIS — M47817 Spondylosis without myelopathy or radiculopathy, lumbosacral region: Secondary | ICD-10-CM | POA: Diagnosis not present

## 2017-08-14 DIAGNOSIS — M797 Fibromyalgia: Secondary | ICD-10-CM | POA: Diagnosis not present

## 2017-08-14 DIAGNOSIS — G8929 Other chronic pain: Secondary | ICD-10-CM | POA: Diagnosis not present

## 2017-08-14 DIAGNOSIS — G834 Cauda equina syndrome: Secondary | ICD-10-CM

## 2017-08-14 DIAGNOSIS — I2510 Atherosclerotic heart disease of native coronary artery without angina pectoris: Secondary | ICD-10-CM | POA: Diagnosis not present

## 2017-08-14 MED ORDER — ZOLPIDEM TARTRATE 5 MG PO TABS: 5.0000 mg | ORAL_TABLET | Freq: Every evening | ORAL | 5 refills | Status: DC | PRN

## 2017-08-14 MED ORDER — METOPROLOL SUCCINATE ER 200 MG PO TB24: 200.0000 mg | ORAL_TABLET | Freq: Every day | ORAL | 3 refills | Status: DC

## 2017-08-14 NOTE — Patient Instructions (Signed)
     IF you received an x-ray today, you will receive an invoice from [facility]. Please contact [facility] at [phone] with questions or concerns regarding your invoice.   IF you received labwork today, you will receive an invoice from [facility]. Please contact [facility] at [phone] with questions or concerns regarding your invoice.   Our billing staff will not be able to assist you with questions regarding bills from these companies.  You will be contacted with the lab results as soon as they are available. The fastest way to get your results is to activate your My Chart account. Instructions are located on the last page of this paperwork. If you have not heard from us regarding the results in 2 weeks, please contact this office.     

## 2017-08-14 NOTE — Progress Notes (Signed)
Subjective:    Patient ID: Kaylee Weiss, female    DOB: 06/29/1945, 72 y.o.   MRN: [id number]  08/14/2017  Calculus of Gallbladder (1 month follow-up )    HPI This 72 y.o. female presents for evaluation of cholelithiasis, nephrolithiasis, chronic lower back pain.  R ear blood:no trauma; just discovered this morning.  No pain.    Insomnia: not sleeping much.   Nine deaths since December:    Cholelithiasis: denies pain; denies gas; denies nausea or vomiting or diarrhea.    Must travel tonight to [place]. Full drive.    Nephrolithiasis: 22mm stone in 2017 on abdominal US.  Now having stinging bees in R anterior leg.   Hip and knee pain: would like hip and knee pain.  No knee swelling.  Never swells.  No stiffness in knee.    BP Readings from Last 3 Encounters:  08/14/17 122/82  07/15/17 108/76  05/08/17 116/68   Wt Readings from Last 3 Encounters:  12/03/16 145 lb (65.8 kg)  06/29/16 143 lb (64.9 kg)  06/01/16 144 lb (65.3 kg)   Immunization History  Administered Date(s) Administered  . Influenza,inj,Quad PF,6+ Mos 11/07/2014, 12/07/2015, 12/03/2016  . Influenza-Unspecified 12/05/2013  . Pneumococcal Conjugate-13 03/07/2014  . Pneumococcal Polysaccharide-23 11/17/2015  . Pneumococcal-Unspecified 03/07/2010  . Td 02/12/2014  . Zoster 03/07/2005  . Zoster Recombinat (Shingrix) 07/15/2016    Review of Systems  Constitutional: Negative for activity change, appetite change, chills, diaphoresis, fatigue, fever and unexpected weight change.  HENT: Negative for congestion, dental problem, drooling, ear discharge, ear pain, facial swelling, hearing loss, mouth sores, nosebleeds, postnasal drip, rhinorrhea, sinus pressure, sneezing, sore throat, tinnitus, trouble swallowing and voice change.   Eyes: Negative for photophobia, pain, discharge, redness, itching and visual disturbance.  Respiratory: Negative for apnea, cough, choking, chest tightness, shortness of breath,  wheezing and stridor.   Cardiovascular: Negative for chest pain, palpitations and leg swelling.  Gastrointestinal: Negative for abdominal distention, abdominal pain, anal bleeding, blood in stool, constipation, diarrhea, nausea, rectal pain and vomiting.  Endocrine: Negative for cold intolerance, heat intolerance, polydipsia, polyphagia and polyuria.  Genitourinary: Negative for decreased urine volume, difficulty urinating, dyspareunia, dysuria, enuresis, flank pain, frequency, genital sores, hematuria, menstrual problem, pelvic pain, urgency, vaginal bleeding, vaginal discharge and vaginal pain.  Musculoskeletal: Positive for arthralgias and back pain. Negative for gait problem, joint swelling, myalgias, neck pain and neck stiffness.  Skin: Negative for color change, pallor, rash and wound.  Allergic/Immunologic: Negative for environmental allergies, food allergies and immunocompromised state.  Neurological: Negative for dizziness, tremors, seizures, syncope, facial asymmetry, speech difficulty, weakness, light-headedness, numbness and headaches.  Hematological: Negative for adenopathy. Does not bruise/bleed easily.  Psychiatric/Behavioral: Positive for dysphoric mood and sleep disturbance. Negative for agitation, behavioral problems, confusion, decreased concentration, hallucinations, self-injury and suicidal ideas. The patient is nervous/anxious. The patient is not hyperactive.     Past Medical History:  Diagnosis Date  . Anxiety   . Arthritis   . Cauda equina syndrome ([place])   . Cervical spinal stenosis   . Fibromyalgia   . Herpes labialis   . Hyperlipidemia   . Hypertension   . Lumbar spinal stenosis   . Macular degeneration, right eye   . Migraine   . PE (pulmonary thromboembolism) ([place])   . Pulmonary embolism ([place])    age 21; not on OCP.  Coumadin x 2 years.  . Retinal vein occlusion    age 78.  Marland Kitchen Spinal stenosis of lumbar region 12/16/2015   L3-4  level   Past Surgical  History:  Procedure Laterality Date  . ABDOMINAL SURGERY    . EYE SURGERY    . HAND SURGERY Left   . ROTATOR CUFF REPAIR Right    Allergies  Allergen Reactions  . Demerol [Meperidine] Anaphylaxis  . Gabapentin Other (See Comments)  . Codeine     Other reaction(s): Other (See Comments)  . Other Other (See Comments)    Suicidal ideation Alcohol (drinking kind); takes liver enzymes and elevates them  . Percocet [Oxycodone-Acetaminophen] Nausea And Vomiting   Current Outpatient Medications on File Prior to Visit  Medication Sig Dispense Refill  . acyclovir ointment (ZOVIRAX) 5 % Apply topically.    Marland Kitchen aspirin 81 MG tablet Take 81 mg by mouth daily.    Marland Kitchen atorvastatin (LIPITOR) 10 MG tablet take 1 tablet by mouth once daily 90 tablet 3  . buPROPion (WELLBUTRIN XL) 150 MG 24 hr tablet TAKE 1 TABLET BY MOUTH ONCE DAILY 90 tablet 0  . celecoxib (CELEBREX) 200 MG capsule take 1 capsule by mouth twice a day 180 capsule 3  . DULoxetine (CYMBALTA) 30 MG capsule Take 1 capsule (30 mg total) by mouth daily. 90 capsule 1  . fluticasone (FLONASE) 50 MCG/ACT nasal spray Place 2 sprays into both nostrils daily. 16 g 12  . folic acid (FOLVITE) 1 MG tablet take 1 tablet by mouth once daily 90 tablet 3  . losartan (COZAAR) 25 MG tablet take 1 tablet by mouth once daily 90 tablet 1  . nitroGLYCERIN (NITROSTAT) 0.4 MG SL tablet Place 1 tablet (0.4 mg total) under the tongue every 5 (five) minutes as needed for chest pain. 50 tablet 3  . OVER THE COUNTER MEDICATION OTC B6 100 mgm taking daily    . OVER THE COUNTER MEDICATION OTC preservision areds taking 1 in the am/qhs    . OVER THE COUNTER MEDICATION OTC B12-SL taking everyday    . OVER THE COUNTER MEDICATION Reported on 04/17/2015    . oxyCODONE-acetaminophen (PERCOCET/ROXICET) 5-325 MG tablet Take 1 tablet by mouth every 6 (six) hours as needed for severe pain. 30 tablet 0  . pregabalin (LYRICA) 25 MG capsule Take 25 mg by mouth 2 (two) times daily.      Marland Kitchen tolterodine (DETROL LA) 2 MG 24 hr capsule TAKE 1 CAPSULE BY MOUTH ONCE DAILY 90 capsule 0  . UNABLE TO FIND Uses Lidocaine topical cream @@ night    . valACYclovir (VALTREX) 500 MG tablet Take 1 tablet (500 mg total) by mouth 2 (two) times daily. 180 tablet 3   No current facility-administered medications on file prior to visit.    Social History   Socioeconomic History  . Marital status: Married    Spouse name: Herbie Baltimore  . Number of children: 2  . Years of education: Therapist, sports  . Highest education level: Not on file  Occupational History  . 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 Financial resource strain: Not on file  . Food insecurity:    Worry: Not on file    Inability: Not on file  . Transportation needs:    Medical: Not on file    Non-medical: Not on file  Tobacco Use  . Smoking status: Never Smoker  . Smokeless tobacco: Never Used  Substance and Sexual Activity  . Alcohol use: Yes    Alcohol/week: 0.0 oz    Comment: 15 cc/wk  . Drug use: No  .  Sexual activity: Never    Partners: Male    Comment: Married  Lifestyle  . Physical activity:    Days per week: Not on file    Minutes per session: Not on file  . Stress: Not on file  Relationships  . Social connections:    Talks on phone: Not on file    Gets together: Not on file    Attends religious service: Not on file    Active member of club or organization: Not on file    Attends meetings of clubs or organizations: Not on file    Relationship status: Not on file  . Intimate partner violence:    Fear of current or ex partner: Not on file    Emotionally abused: Not on file    Physically abused: Not on file    Forced sexual activity: Not on file  Other Topics Concern  . Not on file  Social History Narrative   Marital status:  Married x 47 years      Children: 2 children (49, 62); no granchildren      Lives: with husband, german shepard, kitkat/kitten       Employed: works once per week; has a Patent attorney one day per week; previous therapy practice in 2018.        Tobacco: never      Alcohol:  Socially; one glass of wine per week      Exercise:  Daily exercise; 3.0 miles per day      Advanced Directives:  FULL CODE; no prolonged measures      ADLs: independent with ADLs.        Right-handed   Caffeine: 2 cups of coffee per day         Family History  Problem Relation Age of Onset  . Heart attack Mother   . Stroke Mother        CVA age 43; repeat at age 73 as cause of death.  . Diabetes Mother   . Heart disease Mother 32       recurrent AMIs  . Hyperlipidemia Mother   . Hypertension Mother   . Diabetes Father   . Heart attack Father   . Cancer Father 34       lung cancer  . Hyperlipidemia Father   . Hypertension Father   . Asthma Son        Objective:    BP 122/82   Pulse 71   Temp 98 F (36.7 C) (Oral)   Resp 16   Ht 5' 5.35" (1.66 m)   SpO2 96%   BMI 23.87 kg/m  Physical Exam  Constitutional: She is oriented to person, place, and time. She appears well-developed and well-nourished. No distress.  HENT:  Head: Normocephalic and atraumatic.  Right Ear: External ear normal.  Left Ear: External ear normal.  Nose: Nose normal.  Mouth/Throat: Oropharynx is clear and moist.  Eyes: Pupils are equal, round, and reactive to light. Conjunctivae and EOM are normal.  Neck: Normal range of motion and full passive range of motion without pain. Neck supple. No JVD present. Carotid bruit is not present. No thyromegaly present.  Cardiovascular: Normal rate, regular rhythm, normal heart sounds and intact distal pulses. Exam reveals no gallop and no friction rub.  No murmur heard. Pulmonary/Chest: Effort normal and breath sounds normal. She has no wheezes. She has no rales.  Abdominal: Soft. Bowel sounds are normal. She exhibits no distension and no mass. There is no tenderness. There  is no rebound and no guarding.    Musculoskeletal:       Right shoulder: Normal.       Left shoulder: Normal.       Right hip: She exhibits normal range of motion, normal strength, no tenderness and no bony tenderness.       Left hip: She exhibits normal range of motion, normal strength and no tenderness.       Cervical back: Normal.       Lumbar back: She exhibits decreased range of motion and pain. She exhibits no tenderness and no bony tenderness.  Lymphadenopathy:    She has no cervical adenopathy.  Neurological: She is alert and oriented to person, place, and time. She has normal reflexes. No cranial nerve deficit. She exhibits normal muscle tone. Coordination normal.  Skin: Skin is warm and dry. No rash noted. She is not diaphoretic. No erythema. No pallor.  Psychiatric: She has a normal mood and affect. Her behavior is normal. Judgment and thought content normal.  Nursing note and vitals reviewed.  No results found. Depression screen [facility] 2/9 08/14/2017 07/15/2017 05/08/2017 04/03/2017 02/25/2017  Decreased Interest 1 0 0 0 0  Down, Depressed, Hopeless 1 0 0 0 0  PHQ - 2 Score 2 0 0 0 0  Altered sleeping 0 - - - 3  Tired, decreased energy 0 - - - 3  Change in appetite 0 - - - 0  Feeling bad or failure about yourself  0 - - - 0  Trouble concentrating 0 - - - 0  Moving slowly or fidgety/restless 0 - - - 0  Suicidal thoughts 0 - - - 0  PHQ-9 Score 2 - - - 6  Difficult doing work/chores - - - - Not difficult at all   Fall Risk  08/14/2017 07/15/2017 05/08/2017 04/03/2017 02/25/2017  Falls in the past year? No No No No Yes  Comment - - - - -  Number falls in past yr: - - - - 1  Injury with Fall? - - - - Yes  Comment - - - - knee right  Risk Factor Category  - - - - -  Risk for fall due to : - - - - -  Risk for fall due to: Comment - - - - -        Assessment & Plan:   1. Cauda equina syndrome (HCC)   2. Calculus of gallbladder and bile duct without cholecystitis or obstruction   3. Common bile duct dilation    4. Essential hypertension   5. Lumbar and sacral osteoarthritis   6. Chronic bilateral low back pain with right-sided sciatica   7. Fibromyalgia   8. Nephrolithiasis     Cholelithiasis: asymptomatic yet patient desires surgical resection prior to development of cholecystitis due to aging process.  Refer to general surgery for consultation.  Nephrolithiasis: asymptomatic; large stone present on previous imaging; obtain CT renal stone protocol.  Hypertension: well controlled; refill of Metoprolol provided.  Insomnia: uncontrolled; rx for Ambien provided; will likely warrant prior-authorization.  Cauda equina syndrome with lumbar osteoarthritis: stable on Cymbalta and Lyrica and intermittent injections.  Suffers with daily pain.  Orders Placed This Encounter  Procedures  . CT RENAL STONE STUDY    Standing Status:   Future    Standing Expiration Date:   11/15/2018    Order Specific Question:   Preferred imaging location?    Answer:   [facility]    Order Specific  Question:   Radiology Contrast Protocol - do NOT remove file path    Answer:   [email or web address]  . Ambulatory referral to General Surgery    Referral Priority:   Routine    Referral Type:   Surgical    Referral Reason:   Specialty Services Required    Requested Specialty:   General Surgery    Number of Visits Requested:   1   Meds ordered this encounter  Medications  . metoprolol succinate (TOPROL-XL) 200 MG 24 hr tablet    Sig: Take 1 tablet (200 mg total) by mouth daily. Take with or immediately following a meal.    Dispense:  90 tablet    Refill:  3    Please delete all other rx of 100mg  and 50mg   . zolpidem (AMBIEN) 5 MG tablet    Sig: Take 1 tablet (5 mg total) by mouth at bedtime as needed for sleep.    Dispense:  30 tablet    Refill:  5    No follow-ups on file.   Mykell Genao Elayne Guerin, M.D. Primary Care at [facility] previously [facility] [place] [address] [place], [place]  [address] [phone] phone [phone] fax

## 2017-08-21 ENCOUNTER — Ambulatory Visit: Payer: Medicare Other

## 2017-08-21 ENCOUNTER — Encounter: Payer: Self-pay | Admitting: Family Medicine

## 2017-08-21 ENCOUNTER — Ambulatory Visit
Admission: RE | Admit: 2017-08-21 | Discharge: 2017-08-21 | Disposition: A | Discharge status: Home / Self Care | Payer: Medicare Other | Source: Ambulatory Visit | Attending: Family Medicine | Admitting: Family Medicine

## 2017-08-21 ENCOUNTER — Telehealth: Payer: Self-pay | Admitting: Family Medicine

## 2017-08-21 DIAGNOSIS — N200 Calculus of kidney: Secondary | ICD-10-CM

## 2017-08-21 DIAGNOSIS — N281 Cyst of kidney, acquired: Secondary | ICD-10-CM | POA: Diagnosis not present

## 2017-08-21 DIAGNOSIS — Z87440 Personal history of urinary (tract) infections: Secondary | ICD-10-CM

## 2017-08-21 LAB — POCT URINALYSIS DIP (MANUAL ENTRY)
BILIRUBIN UA: NEGATIVE mg/dL
Bilirubin, UA: NEGATIVE
Blood, UA: NEGATIVE
Glucose, UA: NEGATIVE mg/dL
Nitrite, UA: NEGATIVE
Protein Ur, POC: NEGATIVE mg/dL
Spec Grav, UA: 1.01 (ref 1.010–1.025)
Urobilinogen, UA: 0.2 E.U./dL
pH, UA: 6 (ref 5.0–8.0)

## 2017-08-21 NOTE — Progress Notes (Unsigned)
Lab only visit. Pt did not see provider.   Unable to place on provider schedule due to Epic blocking placing pt's on provider's schedule when they have requested off

## 2017-08-21 NOTE — Telephone Encounter (Signed)
Dr. Tamala Julian  This pt came to the office today for a lab only visit for her urine. I did not see anything no where in the chart stating a need for this. Spoke with pt and she states you too have an understanding that anytime she feels like she is having a UTI she is to give a urine sample. I explained to pt she can give sample but we do not hold urine till the next day. She understood that she may have to come back to give another sample. Please advise

## 2017-08-23 ENCOUNTER — Telehealth: Payer: Self-pay | Admitting: *Deleted

## 2017-08-23 DIAGNOSIS — R300 Dysuria: Secondary | ICD-10-CM

## 2017-08-23 NOTE — Telephone Encounter (Signed)
Please call patient to see if she has tried and failed these two medications.

## 2017-08-23 NOTE — Telephone Encounter (Signed)
Denial for zolpidem, patient must try and fail Rozerem or Silenor first.

## 2017-08-29 NOTE — Telephone Encounter (Signed)
Patient is not sure what she has taken.  Patient states she can try something that would be covered.

## 2017-08-30 MED ORDER — RAMELTEON 8 MG PO TABS: 8.0000 mg | ORAL_TABLET | Freq: Every day | ORAL | 5 refills | Status: DC

## 2017-08-30 NOTE — Addendum Note (Signed)
Addended by: Wardell Honour on: 08/30/2017 04:51 PM   Modules accepted: Orders

## 2017-09-01 NOTE — Addendum Note (Signed)
Addended by: Wardell Honour on: 09/01/2017 04:28 PM   Modules accepted: Orders

## 2017-09-04 ENCOUNTER — Other Ambulatory Visit: Payer: Self-pay | Admitting: Family Medicine

## 2017-09-04 DIAGNOSIS — N390 Urinary tract infection, site not specified: Secondary | ICD-10-CM

## 2017-09-04 DIAGNOSIS — N3289 Other specified disorders of bladder: Secondary | ICD-10-CM

## 2017-09-04 DIAGNOSIS — B9620 Unspecified Escherichia coli [E. coli] as the cause of diseases classified elsewhere: Secondary | ICD-10-CM

## 2017-09-04 HISTORY — DX: Unspecified Escherichia coli (E. coli) as the cause of diseases classified elsewhere: B96.20

## 2017-09-04 HISTORY — DX: Urinary tract infection, site not specified: N39.0

## 2017-09-11 ENCOUNTER — Ambulatory Visit (HOSPITAL_COMMUNITY)
Admission: EM | Admit: 2017-09-11 | Discharge: 2017-09-11 | Disposition: A | Discharge status: Home / Self Care | Payer: Medicare Other | Attending: Emergency Medicine | Admitting: Emergency Medicine

## 2017-09-11 ENCOUNTER — Encounter (HOSPITAL_COMMUNITY): Payer: Self-pay | Admitting: Emergency Medicine

## 2017-09-11 DIAGNOSIS — I10 Essential (primary) hypertension: Secondary | ICD-10-CM | POA: Diagnosis not present

## 2017-09-11 DIAGNOSIS — Z86711 Personal history of pulmonary embolism: Secondary | ICD-10-CM | POA: Insufficient documentation

## 2017-09-11 DIAGNOSIS — R350 Frequency of micturition: Secondary | ICD-10-CM

## 2017-09-11 DIAGNOSIS — Z79899 Other long term (current) drug therapy: Secondary | ICD-10-CM | POA: Insufficient documentation

## 2017-09-11 DIAGNOSIS — G834 Cauda equina syndrome: Secondary | ICD-10-CM | POA: Diagnosis not present

## 2017-09-11 DIAGNOSIS — M545 Low back pain, unspecified: Secondary | ICD-10-CM

## 2017-09-11 DIAGNOSIS — M4180 Other forms of scoliosis, site unspecified: Secondary | ICD-10-CM

## 2017-09-11 DIAGNOSIS — M51369 Other intervertebral disc degeneration, lumbar region without mention of lumbar back pain or lower extremity pain: Secondary | ICD-10-CM

## 2017-09-11 DIAGNOSIS — H3530 Unspecified macular degeneration: Secondary | ICD-10-CM | POA: Insufficient documentation

## 2017-09-11 DIAGNOSIS — M48061 Spinal stenosis, lumbar region without neurogenic claudication: Secondary | ICD-10-CM | POA: Diagnosis not present

## 2017-09-11 DIAGNOSIS — F419 Anxiety disorder, unspecified: Secondary | ICD-10-CM | POA: Insufficient documentation

## 2017-09-11 DIAGNOSIS — G8929 Other chronic pain: Secondary | ICD-10-CM

## 2017-09-11 DIAGNOSIS — N3090 Cystitis, unspecified without hematuria: Secondary | ICD-10-CM

## 2017-09-11 DIAGNOSIS — E785 Hyperlipidemia, unspecified: Secondary | ICD-10-CM | POA: Diagnosis not present

## 2017-09-11 DIAGNOSIS — M5136 Other intervertebral disc degeneration, lumbar region: Secondary | ICD-10-CM | POA: Diagnosis not present

## 2017-09-11 DIAGNOSIS — M4310 Spondylolisthesis, site unspecified: Secondary | ICD-10-CM

## 2017-09-11 DIAGNOSIS — M419 Scoliosis, unspecified: Secondary | ICD-10-CM | POA: Diagnosis not present

## 2017-09-11 DIAGNOSIS — M4316 Spondylolisthesis, lumbar region: Secondary | ICD-10-CM | POA: Insufficient documentation

## 2017-09-11 DIAGNOSIS — M797 Fibromyalgia: Secondary | ICD-10-CM | POA: Diagnosis not present

## 2017-09-11 DIAGNOSIS — Z7982 Long term (current) use of aspirin: Secondary | ICD-10-CM | POA: Insufficient documentation

## 2017-09-11 DIAGNOSIS — Z7951 Long term (current) use of inhaled steroids: Secondary | ICD-10-CM | POA: Diagnosis not present

## 2017-09-11 DIAGNOSIS — R110 Nausea: Secondary | ICD-10-CM

## 2017-09-11 LAB — POCT URINALYSIS DIP (DEVICE)
Bilirubin Urine: NEGATIVE
GLUCOSE, UA: NEGATIVE mg/dL
Hgb urine dipstick: NEGATIVE
KETONES UR: NEGATIVE mg/dL
Nitrite: POSITIVE — AB
Protein, ur: NEGATIVE mg/dL
SPECIFIC GRAVITY, URINE: 1.015 (ref 1.005–1.030)
Urobilinogen, UA: 0.2 mg/dL (ref 0.0–1.0)
pH: 7 (ref 5.0–8.0)

## 2017-09-11 MED ORDER — METHYLPREDNISOLONE ACETATE 80 MG/ML IJ SUSP
INTRAMUSCULAR | Status: AC
  Filled 2017-09-11: qty 1

## 2017-09-11 MED ORDER — METHYLPREDNISOLONE ACETATE 80 MG/ML IJ SUSP
80.0000 mg | Freq: Once | INTRAMUSCULAR | Status: AC
  Administered 2017-09-11: 80 mg via INTRAMUSCULAR

## 2017-09-11 MED ORDER — ONDANSETRON 4 MG PO TBDP
8.0000 mg | ORAL_TABLET | Freq: Once | ORAL | Status: AC
  Administered 2017-09-11: 8 mg via ORAL

## 2017-09-11 MED ORDER — CEPHALEXIN 500 MG PO CAPS: 500.0000 mg | ORAL_CAPSULE | Freq: Three times a day (TID) | ORAL | 0 refills | Status: DC

## 2017-09-11 MED ORDER — ONDANSETRON 4 MG PO TBDP
ORAL_TABLET | ORAL | Status: AC
  Filled 2017-09-11: qty 2

## 2017-09-11 MED ORDER — ONDANSETRON 8 MG PO TBDP: 8.0000 mg | ORAL_TABLET | Freq: Three times a day (TID) | ORAL | 0 refills | Status: DC | PRN

## 2017-09-11 NOTE — Discharge Instructions (Signed)
Please make sure you contact your neurosurgeon, orthopedist for further management of your severe back pain.  If you develop weakness, lose the ability to control your bowel movements or urinate then please report to the ER immediately.  For your urinary tract infection, please start taking Keflex 3 times a day for the next week.  We will let you know if her urine culture results show resistance and we need to change the antibiotic.

## 2017-09-11 NOTE — ED Provider Notes (Signed)
MRN: [id number] DOB: October 03, 1945  Subjective:   Kaylee Weiss is a 72 y.o. female presenting for 3-week history of persistent low-grade fever, chills, urinary frequency.  Patient had an appointment with her PCP Dr. Tamala Julian and was going to try to wait until that appointment but today she developed severe and worsening low back pain, hip pain.  She has a history of chronic back pain with cauda equina syndrome.  She did have an MRI done 01/23/2017, this showed advance lumbar disc and facet degeneration with levoscoliosis and L3-4 anterolisthesis.  It also showed L3-L4 advanced spinal stenosis and right foraminal impingement.  She also has L4-L5 moderate spinal stenosis with bilateral subarticular recess impingement.  She takes hydrocodone with some relief but is requesting help with her back pain today as well.  She is able to take Celebrex but has not done so.  No current facility-administered medications for this encounter.   Current Outpatient Medications:  .  acyclovir ointment (ZOVIRAX) 5 %, Apply topically., Disp: , Rfl:  .  aspirin 81 MG tablet, Take 81 mg by mouth daily., Disp: , Rfl:  .  atorvastatin (LIPITOR) 10 MG tablet, take 1 tablet by mouth once daily, Disp: 90 tablet, Rfl: 3 .  buPROPion (WELLBUTRIN XL) 150 MG 24 hr tablet, TAKE 1 TABLET BY MOUTH ONCE DAILY, Disp: 90 tablet, Rfl: 0 .  celecoxib (CELEBREX) 200 MG capsule, take 1 capsule by mouth twice a day, Disp: 180 capsule, Rfl: 3 .  DULoxetine (CYMBALTA) 30 MG capsule, Take 1 capsule (30 mg total) by mouth daily., Disp: 90 capsule, Rfl: 1 .  fluticasone (FLONASE) 50 MCG/ACT nasal spray, Place 2 sprays into both nostrils daily., Disp: 16 g, Rfl: 12 .  folic acid (FOLVITE) 1 MG tablet, take 1 tablet by mouth once daily, Disp: 90 tablet, Rfl: 3 .  losartan (COZAAR) 25 MG tablet, TAKE 1 TABLET BY MOUTH ONCE DAILY, Disp: 90 tablet, Rfl: 0 .  metoprolol succinate (TOPROL-XL) 200 MG 24 hr tablet, Take 1 tablet (200 mg total) by mouth daily.  Take with or immediately following a meal., Disp: 90 tablet, Rfl: 3 .  nitroGLYCERIN (NITROSTAT) 0.4 MG SL tablet, Place 1 tablet (0.4 mg total) under the tongue every 5 (five) minutes as needed for chest pain., Disp: 50 tablet, Rfl: 3 .  OVER THE COUNTER MEDICATION, OTC B6 100 mgm taking daily, Disp: , Rfl:  .  OVER THE COUNTER MEDICATION, OTC preservision areds taking 1 in the am/qhs, Disp: , Rfl:  .  OVER THE COUNTER MEDICATION, OTC B12-SL taking everyday, Disp: , Rfl:  .  OVER THE COUNTER MEDICATION, Reported on 04/17/2015, Disp: , Rfl:  .  oxyCODONE-acetaminophen (PERCOCET/ROXICET) 5-325 MG tablet, Take 1 tablet by mouth every 6 (six) hours as needed for severe pain., Disp: 30 tablet, Rfl: 0 .  pregabalin (LYRICA) 25 MG capsule, Take 25 mg by mouth 2 (two) times daily., Disp: , Rfl:  .  ramelteon (ROZEREM) 8 MG tablet, Take 1 tablet (8 mg total) by mouth at bedtime., Disp: 30 tablet, Rfl: 5 .  tolterodine (DETROL LA) 2 MG 24 hr capsule, TAKE 1 CAPSULE BY MOUTH ONCE DAILY, Disp: 90 capsule, Rfl: 0 .  UNABLE TO FIND, Uses Lidocaine topical cream @@ night, Disp: , Rfl:  .  valACYclovir (VALTREX) 500 MG tablet, Take 1 tablet (500 mg total) by mouth 2 (two) times daily., Disp: 180 tablet, Rfl: 3 .  zolpidem (AMBIEN) 5 MG tablet, Take 1 tablet (5 mg total) by mouth at  bedtime as needed for sleep., Disp: 30 tablet, Rfl: 5   Allergies  Allergen Reactions  . Demerol [Meperidine] Anaphylaxis  . Gabapentin Other (See Comments)  . Codeine     Other reaction(s): Other (See Comments)  . Other Other (See Comments)    Suicidal ideation Alcohol (drinking kind); takes liver enzymes and elevates them  . Percocet [Oxycodone-Acetaminophen] Nausea And Vomiting    Past Medical History:  Diagnosis Date  . Anxiety   . Arthritis   . Cauda equina syndrome ([place])   . Cervical spinal stenosis   . Fibromyalgia   . Herpes labialis   . Hyperlipidemia   . Hypertension   . Lumbar spinal stenosis   . Macular  degeneration, right eye   . Migraine   . PE (pulmonary thromboembolism) ([place])   . Pulmonary embolism ([place])    age 40; not on OCP.  Coumadin x 2 years.  . Retinal vein occlusion    age 11.  Marland Kitchen Spinal stenosis of lumbar region 12/16/2015   L3-4 level     Past Surgical History:  Procedure Laterality Date  . ABDOMINAL SURGERY    . EYE SURGERY    . HAND SURGERY Left   . ROTATOR CUFF REPAIR Right     Objective:   Vitals: BP 138/75   Pulse 78   Temp 100 F (37.8 C) (Oral)   Resp 16   Wt 145 lb (65.8 kg)   SpO2 98%   BMI 23.87 kg/m   Physical Exam  Constitutional: She is oriented to person, place, and time. She appears well-developed and well-nourished.  Cardiovascular: Normal rate, regular rhythm and intact distal pulses. Exam reveals no gallop and no friction rub.  No murmur heard. Pulmonary/Chest: No respiratory distress. She has no wheezes. She has no rales.  Abdominal: Soft. Bowel sounds are normal. She exhibits no distension and no mass. There is no tenderness. There is no rebound and no guarding.  No CVA tenderness.  Musculoskeletal: She exhibits no edema.       Lumbar back: She exhibits decreased range of motion (full flexion) and tenderness (mild over area depicted). She exhibits no bony tenderness, no swelling, no edema, no deformity, no laceration and no spasm.       Back:  Neurological: She is alert and oriented to person, place, and time. She displays normal reflexes. Coordination (moving very gingerly due to her back pain) abnormal.  Skin: Skin is warm and dry. No rash noted. No erythema. No pallor.    Results for orders placed or performed during the hospital encounter of 09/11/17 (from the past 24 hour(s))  POCT urinalysis dip (device)     Status: Abnormal   Collection Time: 09/11/17  8:33 PM  Result Value Ref Range   Glucose, UA NEGATIVE NEGATIVE mg/dL   Bilirubin Urine NEGATIVE NEGATIVE   Ketones, ur NEGATIVE NEGATIVE mg/dL   Specific Gravity, Urine  1.015 1.005 - 1.030   Hgb urine dipstick NEGATIVE NEGATIVE   pH 7.0 5.0 - 8.0   Protein, ur NEGATIVE NEGATIVE mg/dL   Urobilinogen, UA 0.2 0.0 - 1.0 mg/dL   Nitrite POSITIVE (A) NEGATIVE   Leukocytes, UA TRACE (A) NEGATIVE    Assessment and Plan :   Lumbar degenerative disc disease  Levoscoliosis  Anterolisthesis  Spinal stenosis of lumbar region, unspecified whether neurogenic claudication present  Cystitis  Chronic bilateral low back pain without sciatica  Nausea without vomiting  IM Depo-Medrol in clinic today.  Use Zofran at home for nausea.  Patient is to maintain her hydrocodone and try Celebrex as well for her chronic back pain.  Counseled that she really needs to talk with her neurosurgeon again as she is hoping to get spinal injection.  I counseled that she would not be wrong to go to the ER but she is not interested in this today.  For her cystitis, we will use Keflex, urine culture pending.  Follow-up with PCP.   Jaynee Eagles, [place] 09/11/17 [id number]

## 2017-09-11 NOTE — ED Triage Notes (Signed)
PT reports urinary symptoms for 3 weeks. Low grade fever, chills. PT reports worsening back and hip pain - chronic. PT has percocet at home for pain management.

## 2017-09-14 ENCOUNTER — Telehealth (HOSPITAL_COMMUNITY): Payer: Self-pay

## 2017-09-14 LAB — URINE CULTURE

## 2017-09-14 NOTE — Telephone Encounter (Signed)
Urine culture positive for e coli. This was treated with keflex at recent visit. Pt made aware.

## 2017-09-19 ENCOUNTER — Ambulatory Visit: Payer: Self-pay | Admitting: Surgery

## 2017-09-19 DIAGNOSIS — K838 Other specified diseases of biliary tract: Secondary | ICD-10-CM | POA: Diagnosis not present

## 2017-09-19 DIAGNOSIS — K8020 Calculus of gallbladder without cholecystitis without obstruction: Secondary | ICD-10-CM | POA: Diagnosis not present

## 2017-09-20 ENCOUNTER — Ambulatory Visit (INDEPENDENT_AMBULATORY_CARE_PROVIDER_SITE_OTHER): Payer: Medicare Other

## 2017-09-20 ENCOUNTER — Encounter: Payer: Self-pay | Admitting: Family Medicine

## 2017-09-20 ENCOUNTER — Ambulatory Visit (INDEPENDENT_AMBULATORY_CARE_PROVIDER_SITE_OTHER): Payer: Medicare Other | Admitting: Family Medicine

## 2017-09-20 ENCOUNTER — Other Ambulatory Visit: Payer: Self-pay

## 2017-09-20 VITALS — BP 132/82 | HR 62 | Temp 97.8°F | Resp 16 | Ht 65.0 in

## 2017-09-20 DIAGNOSIS — N3942 Incontinence without sensory awareness: Secondary | ICD-10-CM

## 2017-09-20 DIAGNOSIS — R300 Dysuria: Secondary | ICD-10-CM

## 2017-09-20 DIAGNOSIS — K8020 Calculus of gallbladder without cholecystitis without obstruction: Secondary | ICD-10-CM | POA: Diagnosis not present

## 2017-09-20 DIAGNOSIS — M5136 Other intervertebral disc degeneration, lumbar region: Secondary | ICD-10-CM | POA: Diagnosis not present

## 2017-09-20 DIAGNOSIS — F5104 Psychophysiologic insomnia: Secondary | ICD-10-CM | POA: Diagnosis not present

## 2017-09-20 DIAGNOSIS — G834 Cauda equina syndrome: Secondary | ICD-10-CM

## 2017-09-20 DIAGNOSIS — M47817 Spondylosis without myelopathy or radiculopathy, lumbosacral region: Secondary | ICD-10-CM

## 2017-09-20 DIAGNOSIS — M25552 Pain in left hip: Secondary | ICD-10-CM | POA: Diagnosis not present

## 2017-09-20 DIAGNOSIS — I2510 Atherosclerotic heart disease of native coronary artery without angina pectoris: Secondary | ICD-10-CM | POA: Diagnosis not present

## 2017-09-20 DIAGNOSIS — I10 Essential (primary) hypertension: Secondary | ICD-10-CM | POA: Diagnosis not present

## 2017-09-20 DIAGNOSIS — R52 Pain, unspecified: Secondary | ICD-10-CM | POA: Diagnosis not present

## 2017-09-20 DIAGNOSIS — M48062 Spinal stenosis, lumbar region with neurogenic claudication: Secondary | ICD-10-CM

## 2017-09-20 MED ORDER — ZOLPIDEM TARTRATE 5 MG PO TABS: 5.0000 mg | ORAL_TABLET | Freq: Every evening | ORAL | 5 refills | Status: DC | PRN

## 2017-09-20 MED ORDER — CEPHALEXIN 500 MG PO CAPS: 500.0000 mg | ORAL_CAPSULE | Freq: Four times a day (QID) | ORAL | 0 refills | Status: DC

## 2017-09-20 NOTE — Patient Instructions (Signed)
     IF you received an x-ray today, you will receive an invoice from [facility]. Please contact  Radiology at [phone] with questions or concerns regarding your invoice.   IF you received labwork today, you will receive an invoice from [facility]. Please contact [facility] at [phone] with questions or concerns regarding your invoice.   Our billing staff will not be able to assist you with questions regarding bills from these companies.  You will be contacted with the lab results as soon as they are available. The fastest way to get your results is to activate your My Chart account. Instructions are located on the last page of this paperwork. If you have not heard from us regarding the results in 2 weeks, please contact this office.     

## 2017-09-20 NOTE — Progress Notes (Signed)
Subjective:    Patient ID: Kaylee Weiss, female    DOB: 1945-06-21, 72 y.o.   MRN: [id number]  09/20/2017  Urine odor (pt states she still have an odor to her urine )    HPI This 72 y.o. female presents for Urgent Care follow-up for UTI and worsening lower back pain.  Treated with Keflex.  Onset of malodorous urine and acute worsening of lower back pain.  l hip was so painful; unable to walk almost.  Received IM steroids which really helped with ambulation.  Quite miserable due to l hip pain; can barely sit on L buttocks; makes self walk for one hour; R knee and leg really hurt.  If did not look down, would think was being bitten/stung by bees.  After walk, must lay down for an hour.  Will try to get up and do a few things; then must sit down for a while.  Pain interferes with sitting.  Unable to sit.  Not good.  Pain was a 9/10 last week.  Pain much worse than childbirth last week.  A few days before L hip pain developed, did a little bit of gardening. Was sitting all the time due to pain.  Something happened.  PA Mani recommended follow-up with Neurosurgery.   Last injection January 2019.  Needs guidance.   The lower back and R leg are typical pain.  L hip buttocks pain feels like a ball present.  Especially when sitting there.  No radiation.  Sitting is the worse.  Prolonged standing causes worsening pain.  No trauma.   No burning or bubbles or bee stings on the LEFT.  No limping on LEFT hip.  Struggling being upright due to pain.  Needed to hold onto something.  Has only taken Percocet three times in the past month.   Last appointment with orthopedics in January 2019.   Went to [facility]; requested [facility]; really likes [place].  Scheduled on 10/06/17 for cholecystectomy.  Cannot imagine being acutely ill.     BP Readings from Last 3 Encounters:  09/20/17 132/82  09/11/17 138/75  08/14/17 122/82   Wt Readings from Last 3 Encounters:  09/11/17 145 lb (65.8 kg)  12/03/16 145  lb (65.8 kg)  06/29/16 143 lb (64.9 kg)   Immunization History  Administered Date(s) Administered  . Influenza,inj,Quad PF,6+ Mos 11/07/2014, 12/07/2015, 12/03/2016  . Influenza-Unspecified 12/05/2013  . Pneumococcal Conjugate-13 03/07/2014  . Pneumococcal Polysaccharide-23 11/17/2015  . Pneumococcal-Unspecified 03/07/2010  . Td 02/12/2014  . Zoster 03/07/2005  . Zoster Recombinat (Shingrix) 07/15/2016    Review of Systems  Constitutional: Negative for activity change, appetite change, chills, diaphoresis, fatigue, fever and unexpected weight change.  HENT: Negative for congestion, dental problem, drooling, ear discharge, ear pain, facial swelling, hearing loss, mouth sores, nosebleeds, postnasal drip, rhinorrhea, sinus pressure, sneezing, sore throat, tinnitus, trouble swallowing and voice change.   Eyes: Negative for photophobia, pain, discharge, redness, itching and visual disturbance.  Respiratory: Negative for apnea, cough, choking, chest tightness, shortness of breath, wheezing and stridor.   Cardiovascular: Negative for chest pain, palpitations and leg swelling.  Gastrointestinal: Negative for abdominal distention, abdominal pain, anal bleeding, blood in stool, constipation, diarrhea, nausea, rectal pain and vomiting.  Endocrine: Negative for cold intolerance, heat intolerance, polydipsia, polyphagia and polyuria.  Genitourinary: Positive for frequency and urgency. Negative for decreased urine volume, difficulty urinating, dyspareunia, dysuria, enuresis, flank pain, genital sores, hematuria, menstrual problem, pelvic pain, vaginal bleeding, vaginal discharge and vaginal pain.  Musculoskeletal:  Positive for arthralgias, back pain, gait problem and myalgias. Negative for joint swelling, neck pain and neck stiffness.  Skin: Negative for color change, pallor, rash and wound.  Allergic/Immunologic: Negative for environmental allergies, food allergies and immunocompromised state.    Neurological: Negative for dizziness, tremors, seizures, syncope, facial asymmetry, speech difficulty, weakness, light-headedness, numbness and headaches.  Hematological: Negative for adenopathy. Does not bruise/bleed easily.  Psychiatric/Behavioral: Negative for agitation, behavioral problems, confusion, decreased concentration, dysphoric mood, hallucinations, self-injury, sleep disturbance and suicidal ideas. The patient is not nervous/anxious and is not hyperactive.     Past Medical History:  Diagnosis Date  . Anxiety   . Arthritis   . Cauda equina syndrome ([place])   . Cervical spinal stenosis   . Fibromyalgia   . Herpes labialis   . Hyperlipidemia   . Hypertension   . Lumbar spinal stenosis   . Macular degeneration, right eye   . Migraine   . PE (pulmonary thromboembolism) ([place])   . Pulmonary embolism ([place])    age 72; not on OCP.  Coumadin x 2 years.  . Retinal vein occlusion    age 56.  Marland Kitchen Spinal stenosis of lumbar region 12/16/2015   L3-4 level   Past Surgical History:  Procedure Laterality Date  . ABDOMINAL SURGERY    . EYE SURGERY    . HAND SURGERY Left   . ROTATOR CUFF REPAIR Right    Allergies  Allergen Reactions  . Demerol [Meperidine] Anaphylaxis  . Gabapentin Other (See Comments)  . Codeine     Other reaction(s): Other (See Comments)  . Other Other (See Comments)    Suicidal ideation Alcohol (drinking kind); takes liver enzymes and elevates them  . Percocet [Oxycodone-Acetaminophen] Nausea And Vomiting   Current Outpatient Medications on File Prior to Visit  Medication Sig Dispense Refill  . acyclovir ointment (ZOVIRAX) 5 % Apply topically.    Marland Kitchen aspirin 81 MG tablet Take 81 mg by mouth daily.    Marland Kitchen atorvastatin (LIPITOR) 10 MG tablet take 1 tablet by mouth once daily 90 tablet 3  . buPROPion (WELLBUTRIN XL) 150 MG 24 hr tablet TAKE 1 TABLET BY MOUTH ONCE DAILY 90 tablet 0  . celecoxib (CELEBREX) 200 MG capsule take 1 capsule by mouth twice a day 180  capsule 3  . DULoxetine (CYMBALTA) 30 MG capsule Take 1 capsule (30 mg total) by mouth daily. 90 capsule 1  . fluticasone (FLONASE) 50 MCG/ACT nasal spray Place 2 sprays into both nostrils daily. 16 g 12  . folic acid (FOLVITE) 1 MG tablet take 1 tablet by mouth once daily 90 tablet 3  . losartan (COZAAR) 25 MG tablet TAKE 1 TABLET BY MOUTH ONCE DAILY 90 tablet 0  . metoprolol succinate (TOPROL-XL) 200 MG 24 hr tablet Take 1 tablet (200 mg total) by mouth daily. Take with or immediately following a meal. 90 tablet 3  . nitroGLYCERIN (NITROSTAT) 0.4 MG SL tablet Place 1 tablet (0.4 mg total) under the tongue every 5 (five) minutes as needed for chest pain. 50 tablet 3  . OVER THE COUNTER MEDICATION OTC B6 100 mgm taking daily    . OVER THE COUNTER MEDICATION OTC preservision areds taking 1 in the am/qhs    . OVER THE COUNTER MEDICATION OTC B12-SL taking everyday    . OVER THE COUNTER MEDICATION Reported on 04/17/2015    . oxyCODONE-acetaminophen (PERCOCET/ROXICET) 5-325 MG tablet Take 1 tablet by mouth every 6 (six) hours as needed for severe pain. 30 tablet 0  .  pregabalin (LYRICA) 25 MG capsule Take 25 mg by mouth 2 (two) times daily.    Marland Kitchen UNABLE TO FIND Uses Lidocaine topical cream @@ night    . valACYclovir (VALTREX) 500 MG tablet Take 1 tablet (500 mg total) by mouth 2 (two) times daily. 180 tablet 3   No current facility-administered medications on file prior to visit.    Social History   Socioeconomic History  . Marital status: Married    Spouse name: Herbie Baltimore  . Number of children: 2  . Years of education: Therapist, sports  . Highest education level: Not on file  Occupational History  . 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 Financial resource strain: Not on file  . Food insecurity:    Worry: Not on file    Inability: Not on file  . Transportation needs:    Medical: Not on file    Non-medical: Not on file  Tobacco Use  . Smoking status:  Never Smoker  . Smokeless tobacco: Never Used  Substance and Sexual Activity  . Alcohol use: Yes    Alcohol/week: 0.0 oz    Comment: 15 cc/wk  . Drug use: No  . Sexual activity: Never    Partners: Male    Comment: Married  Lifestyle  . Physical activity:    Days per week: Not on file    Minutes per session: Not on file  . Stress: Not on file  Relationships  . Social connections:    Talks on phone: Not on file    Gets together: Not on file    Attends religious service: Not on file    Active member of club or organization: Not on file    Attends meetings of clubs or organizations: Not on file    Relationship status: Not on file  . Intimate partner violence:    Fear of current or ex partner: Not on file    Emotionally abused: Not on file    Physically abused: Not on file    Forced sexual activity: Not on file  Other Topics Concern  . Not on file  Social History Narrative   Marital status:  Married x 47 years      Children: 2 children (49, 27); no granchildren      Lives: with husband, german shepard, kitkat/kitten      Employed: works once per week; has a Patent attorney one day per week; previous therapy practice in 2018.        Tobacco: never      Alcohol:  Socially; one glass of wine per week      Exercise:  Daily exercise; 3.0 miles per day      Advanced Directives:  FULL CODE; no prolonged measures      ADLs: independent with ADLs.        Right-handed   Caffeine: 2 cups of coffee per day         Family History  Problem Relation Age of Onset  . Heart attack Mother   . Stroke Mother        CVA age 50; repeat at age 67 as cause of death.  . Diabetes Mother   . Heart disease Mother 70       recurrent AMIs  . Hyperlipidemia Mother   . Hypertension Mother   . Diabetes Father   . Heart attack Father   . Cancer Father 39  lung cancer  . Hyperlipidemia Father   . Hypertension Father   . Asthma Son        Objective:    BP 132/82   Pulse 62   Temp  97.8 F (36.6 C) (Oral)   Resp 16   Ht 5\' 5"  (1.651 m)   SpO2 97%   BMI 24.13 kg/m  Physical Exam  Constitutional: She is oriented to person, place, and time. She appears well-developed and well-nourished. No distress.  HENT:  Head: Normocephalic and atraumatic.  Right Ear: External ear normal.  Left Ear: External ear normal.  Nose: Nose normal.  Mouth/Throat: Oropharynx is clear and moist.  Eyes: Pupils are equal, round, and reactive to light. Conjunctivae and EOM are normal.  Neck: Normal range of motion. Neck supple. Carotid bruit is not present. No thyromegaly present.  Cardiovascular: Normal rate, regular rhythm, normal heart sounds and intact distal pulses. Exam reveals no gallop and no friction rub.  No murmur heard. Pulmonary/Chest: Effort normal and breath sounds normal. She has no wheezes. She has no rales.  Abdominal: Soft. Bowel sounds are normal. She exhibits no distension and no mass. There is no tenderness. There is no rebound and no guarding.  Musculoskeletal:       Left hip: Normal. She exhibits normal range of motion, normal strength, no tenderness, no bony tenderness, no swelling, no crepitus, no deformity and no laceration.       Lumbar back: She exhibits decreased range of motion and pain. She exhibits no tenderness, no bony tenderness, no spasm and normal pulse.  Lymphadenopathy:    She has no cervical adenopathy.  Neurological: She is alert and oriented to person, place, and time. No cranial nerve deficit.  Skin: Skin is warm and dry. No rash noted. She is not diaphoretic. No erythema. No pallor.  Psychiatric: She has a normal mood and affect. Her behavior is normal. Judgment and thought content normal.   Dg Lumbar Spine Complete  Result Date: 09/20/2017 CLINICAL DATA:  Left buttocks pain EXAM: LUMBAR SPINE - COMPLETE 4+ VIEW COMPARISON:  Lumbar spine MRI 01/23/2017 FINDINGS: There is transitional lumbosacral anatomy. Numbering is preserved from the 01/24/2017  study. There are hypoplastic ribs at T12. There is partial sacralization of L5 with a right-sided assimilation joint. There is lateral subluxation of L3 relative to L2 and L4, unchanged. Vertebral body heights are maintained. Disc space narrowing is worst at L3-4, where there is also unchanged grade 1 anterolisthesis. There is severe lower lumbar facet arthrosis. IMPRESSION: Unchanged appearance of the lumbar spine with levoscoliosis and degenerative disc disease worst at L3-L4. No acute abnormality. Electronically Signed   By: Ulyses Jarred M.D.   On: 09/20/2017 13:44   Dg Hip Unilat W Or W/o Pelvis 2-3 Views Left  Result Date: 09/20/2017 CLINICAL DATA:  Left buttocks pain. EXAM: DG HIP (WITH OR WITHOUT PELVIS) 2-3V LEFT COMPARISON:  08/10/2015 FINDINGS: There is no evidence of hip fracture or dislocation. Sclerotic focus in the left proximal femur is unchanged. Normal bowel gas pattern. No advanced arthropathy. IMPRESSION: Unremarkable left hip and pelvis. Electronically Signed   By: Ulyses Jarred M.D.   On: 09/20/2017 13:41   Depression screen [facility] 2/9 09/20/2017 08/14/2017 07/15/2017 05/08/2017 04/03/2017  Decreased Interest 0 1 0 0 0  Down, Depressed, Hopeless 0 1 0 0 0  PHQ - 2 Score 0 2 0 0 0  Altered sleeping - 0 - - -  Tired, decreased energy - 0 - - -  Change  in appetite - 0 - - -  Feeling bad or failure about yourself  - 0 - - -  Trouble concentrating - 0 - - -  Moving slowly or fidgety/restless - 0 - - -  Suicidal thoughts - 0 - - -  PHQ-9 Score - 2 - - -  Difficult doing work/chores - - - - -  Some recent data might be hidden   Fall Risk  09/20/2017 08/14/2017 07/15/2017 05/08/2017 04/03/2017  Falls in the past year? No No No No No  Comment - - - - -  Number falls in past yr: - - - - -  Injury with Fall? - - - - -  Comment - - - - -  Risk Factor Category  - - - - -  Risk for fall due to : - - - - -  Risk for fall due to: Comment - - - - -        Assessment & Plan:   1. Left hip  pain   2. Lumbar and sacral osteoarthritis   3. Dysuria   4. Essential hypertension   5. Calculus of gallbladder without cholecystitis without obstruction   6. Cauda equina syndrome ([place])   7. Urinary incontinence without sensory awareness   8. Spinal stenosis of lumbar region with neurogenic claudication   9. Psychophysiological insomnia     Left hip pain: New onset.  No previous left hip pain.  Obtain left hip x-rays as well as lumbo-sacral films.  Refer to Ortho for further evaluation due to severity of pain.  Continue Celebrex, Lyrica, Percocet therapies.  Encourage regular use of Percocet with acute pain.  Severe lumbar degenerative disc disease with spinal stenosis with cauda equina syndrome: Chronic with worsening right radicular symptoms.  Refer back to orthopedics with Dr. Ubaldo Glassing ago for evaluation of possible injection due to upcoming cholecystectomy.  Patient underwent recent Depo-Medrol injection in urgent care.  Continue current therapy.  Dysuria with recent positive urine culture: Persistent symptoms despite completion of Keflex therapy.  Send urine culture.  Prescription provided for additional Keflex.  Hypertension: Elevated with acute pain in the past week.  Normalizing today.  Cholelithiasis: Status post general surgery consultation.  Scheduled for cholecystectomy on October 06, 2017.  Obtain labs for surgical clearance.  Exercising daily for 1 hour with walking.  Insomnia: Patient insurance refuses to pay for Ambien therapy.  Requested alternative therapy yet much more expensive.  Patient requesting refill for Ambien as it is $10 cash paying at [facility].  Urinary incontinence urge: Chronic.  Patient discontinued bladder agent due to potential side effects.   Orders Placed This Encounter  Procedures  . Urine Culture    Standing Status:   Future    Number of Occurrences:   1    Standing Expiration Date:   09/21/2018    Order Specific Question:   Source    Answer:   clean  catch  . DG HIP UNILAT W OR W/O PELVIS 2-3 VIEWS LEFT    Standing Status:   Future    Number of Occurrences:   1    Standing Expiration Date:   09/20/2018    Order Specific Question:   Reason for Exam (SYMPTOM  OR DIAGNOSIS REQUIRED)    Answer:   acute onset l buttocks pain; worsening pain with sitting    Order Specific Question:   Preferred imaging location?    Answer:   External  . DG Lumbar Spine Complete  Standing Status:   Future    Number of Occurrences:   1    Standing Expiration Date:   09/20/2018    Order Specific Question:   Reason for Exam (SYMPTOM  OR DIAGNOSIS REQUIRED)    Answer:   acute onset l buttocks pain; worsening pain with sitting    Order Specific Question:   Preferred imaging location?    Answer:   External  . Urinalysis, dipstick only    Standing Status:   Future    Number of Occurrences:   1    Standing Expiration Date:   09/21/2018  . CBC with Differential/Platelet  . Comprehensive metabolic panel  . Ambulatory referral to Orthopedic Surgery    Referral Priority:   Routine    Referral Type:   Surgical    Referral Reason:   Specialty Services Required    Requested Specialty:   Orthopedic Surgery    Number of Visits Requested:   1   Meds ordered this encounter  Medications  . zolpidem (AMBIEN) 5 MG tablet    Sig: Take 1 tablet (5 mg total) by mouth at bedtime as needed for sleep.    Dispense:  30 tablet    Refill:  5  . cephALEXin (KEFLEX) 500 MG capsule    Sig: Take 1 capsule (500 mg total) by mouth 4 (four) times daily.    Dispense:  28 capsule    Refill:  0    No follow-ups on file.   Clotilda Hafer Elayne Guerin, M.D. Primary [facility] at [facility] [address] [place], [place]  [address] [phone] phone [phone] fax

## 2017-09-21 DIAGNOSIS — R300 Dysuria: Secondary | ICD-10-CM | POA: Diagnosis not present

## 2017-09-21 LAB — CBC WITH DIFFERENTIAL/PLATELET
Basophils Absolute: 0.1 10*3/uL (ref 0.0–0.2)
Basos: 0 %
EOS (ABSOLUTE): 0.3 10*3/uL (ref 0.0–0.4)
EOS: 3 %
HEMATOCRIT: 46.7 % — AB (ref 34.0–46.6)
Hemoglobin: 15.1 g/dL (ref 11.1–15.9)
IMMATURE GRANULOCYTES: 1 %
Immature Grans (Abs): 0.1 10*3/uL (ref 0.0–0.1)
Lymphocytes Absolute: 2.9 10*3/uL (ref 0.7–3.1)
Lymphs: 24 %
MCH: 30 pg (ref 26.6–33.0)
MCHC: 32.3 g/dL (ref 31.5–35.7)
MCV: 93 fL (ref 79–97)
MONOS ABS: 1 10*3/uL — AB (ref 0.1–0.9)
Monocytes: 9 %
NEUTROS ABS: 7.6 10*3/uL — AB (ref 1.4–7.0)
NEUTROS PCT: 63 %
PLATELETS: 302 10*3/uL (ref 150–450)
RBC: 5.04 x10E6/uL (ref 3.77–5.28)
RDW: 11.7 % — ABNORMAL LOW (ref 12.3–15.4)
WBC: 11.9 10*3/uL — ABNORMAL HIGH (ref 3.4–10.8)

## 2017-09-21 LAB — COMPREHENSIVE METABOLIC PANEL
ALBUMIN: 4.4 g/dL (ref 3.5–4.8)
ALK PHOS: 112 IU/L (ref 39–117)
ALT: 28 IU/L (ref 0–32)
AST: 20 IU/L (ref 0–40)
Albumin/Globulin Ratio: 1.7 (ref 1.2–2.2)
BUN/Creatinine Ratio: 27 (ref 12–28)
BUN: 21 mg/dL (ref 8–27)
Bilirubin Total: 0.4 mg/dL (ref 0.0–1.2)
CHLORIDE: 102 mmol/L (ref 96–106)
CO2: 24 mmol/L (ref 20–29)
CREATININE: 0.79 mg/dL (ref 0.57–1.00)
Calcium: 10 mg/dL (ref 8.7–10.3)
GFR calc Af Amer: 86 mL/min/{1.73_m2} (ref >59)
GFR calc non Af Amer: 75 mL/min/{1.73_m2} (ref >59)
GLUCOSE: 91 mg/dL (ref 65–99)
Globulin, Total: 2.6 g/dL (ref 1.5–4.5)
Potassium: 5.1 mmol/L (ref 3.5–5.2)
Sodium: 140 mmol/L (ref 134–144)
Total Protein: 7 g/dL (ref 6.0–8.5)

## 2017-09-22 ENCOUNTER — Other Ambulatory Visit: Payer: Self-pay

## 2017-09-22 ENCOUNTER — Encounter: Payer: Self-pay | Admitting: Family Medicine

## 2017-09-22 ENCOUNTER — Ambulatory Visit (INDEPENDENT_AMBULATORY_CARE_PROVIDER_SITE_OTHER): Payer: Medicare Other | Admitting: Family Medicine

## 2017-09-22 VITALS — BP 132/82 | HR 61 | Temp 98.0°F | Resp 16 | Ht 65.0 in

## 2017-09-22 DIAGNOSIS — M25552 Pain in left hip: Secondary | ICD-10-CM

## 2017-09-22 DIAGNOSIS — I2510 Atherosclerotic heart disease of native coronary artery without angina pectoris: Secondary | ICD-10-CM | POA: Diagnosis not present

## 2017-09-22 DIAGNOSIS — M47817 Spondylosis without myelopathy or radiculopathy, lumbosacral region: Secondary | ICD-10-CM

## 2017-09-22 DIAGNOSIS — G834 Cauda equina syndrome: Secondary | ICD-10-CM

## 2017-09-22 DIAGNOSIS — K5901 Slow transit constipation: Secondary | ICD-10-CM

## 2017-09-22 NOTE — Patient Instructions (Addendum)
  Miralax --- 1 capful in 8 ounces of liquid once or twice daily. Generic Colace 100mg  -- one tablet twice daily.  Senakot-S or Pericolace  -- 1-2 tablets twice daily as needed.    Constipation, Adult Constipation is when a person has fewer bowel movements in a week than normal, has difficulty having a bowel movement, or has stools that are dry, hard, or larger than normal. Constipation may be caused by an underlying condition. It may become worse with age if a person takes certain medicines and does not take in enough fluids. Follow these instructions at home: Eating and drinking   Eat foods that have a lot of fiber, such as fresh fruits and vegetables, whole grains, and beans.  Limit foods that are high in fat, low in fiber, or overly processed, such as french fries, hamburgers, cookies, candies, and soda.  Drink enough fluid to keep your urine clear or pale yellow. General instructions  Exercise regularly or as told by your health care provider.  Go to the restroom when you have the urge to go. Do not hold it in.  Take over-the-counter and prescription medicines only as told by your health care provider. These include any fiber supplements.  Practice pelvic floor retraining exercises, such as deep breathing while relaxing the lower abdomen and pelvic floor relaxation during bowel movements.  Watch your condition for any changes.  Keep all follow-up visits as told by your health care provider. This is important. Contact a health care provider if:  You have pain that gets worse.  You have a fever.  You do not have a bowel movement after 4 days.  You vomit.  You are not hungry.  You lose weight.  You are bleeding from the anus.  You have thin, pencil-like stools. Get help right away if:  You have a fever and your symptoms suddenly get worse.  You leak stool or have blood in your stool.  Your abdomen is bloated.  You have severe pain in your abdomen.  You feel  dizzy or you faint. This information is not intended to replace advice given to you by your health care provider. Make sure you discuss any questions you have with your health care provider. Document Released: 11/20/2003 Document Revised: 09/11/2015 Document Reviewed: 08/12/2015 Elsevier Interactive Patient Education  2018 [employer].    IF you received an x-ray today, you will receive an invoice from [facility]. Please contact [facility] at [phone] with questions or concerns regarding your invoice.   IF you received labwork today, you will receive an invoice from [facility]. Please contact [facility] at [phone] with questions or concerns regarding your invoice.   Our billing staff will not be able to assist you with questions regarding bills from these companies.  You will be contacted with the lab results as soon as they are available. The fastest way to get your results is to activate your My Chart account. Instructions are located on the last page of this paperwork. If you have not heard from [place] regarding the results in 2 weeks, please contact this office.

## 2017-09-22 NOTE — Progress Notes (Signed)
Subjective:    Patient ID: Kaylee Weiss, female    DOB: September 20, 1945, 72 y.o.   MRN: [id number]  09/22/2017  Left leg Pain (pt states she was playing with the dogs and noticed her left leg binds inward and makes her have muscular pain. )    HPI This 72 y.o. female presents for 48 hour follow-up of L hip pain. Has a new puppy who is fifty pounds. Training puppy to come to patient in the park.   Goes down in a funny position, calps hand, call them.  Opens arm and dog barrels into her arms. Went to do that this morning; something immediately connected. Turned leg in when squats and immediate pain develops.  Must be muscular.  That is likely the cause/trigger.   There was a tree next to patient.   Might have been husband car/[employer].   Started training the puppy; they don't run or crash into patient; know not to run into patient. Bred to be therapy dog; very gentle.   Horrible constipation: last bowel movement; had to disimpact self the other day.   Has not feeling but usually can feel bloating/fullness.   Disimpact self 48 hours ago.  Knows must go.  No urge.  Try to push out but does not come.  No previous Miralax.   Patient reports new onset of decrease in sensation of the lower abdomen.  Feels it is related to worsening lower back pain and worsening right radicular symptoms.   BP Readings from Last 3 Encounters:  09/22/17 132/82  09/20/17 132/82  09/11/17 138/75   Wt Readings from Last 3 Encounters:  09/11/17 145 lb (65.8 kg)  12/03/16 145 lb (65.8 kg)  06/29/16 143 lb (64.9 kg)   Immunization History  Administered Date(s) Administered  . Influenza,inj,Quad PF,6+ Mos 11/07/2014, 12/07/2015, 12/03/2016  . Influenza-Unspecified 12/05/2013  . Pneumococcal Conjugate-13 03/07/2014  . Pneumococcal Polysaccharide-23 11/17/2015  . Pneumococcal-Unspecified 03/07/2010  . Td 02/12/2014  . Zoster 03/07/2005  . Zoster Recombinat (Shingrix) 07/15/2016    Review of Systems    Constitutional: Negative for chills, diaphoresis, fatigue and fever.  Eyes: Negative for visual disturbance.  Respiratory: Negative for cough and shortness of breath.   Cardiovascular: Negative for chest pain, palpitations and leg swelling.  Gastrointestinal: Positive for constipation. Negative for abdominal distention, abdominal pain, anal bleeding, blood in stool, diarrhea, nausea, rectal pain and vomiting.  Endocrine: Negative for cold intolerance, heat intolerance, polydipsia, polyphagia and polyuria.  Genitourinary: Negative for dysuria and flank pain.  Musculoskeletal: Positive for arthralgias, back pain, gait problem and myalgias. Negative for joint swelling.  Neurological: Negative for dizziness, tremors, seizures, syncope, facial asymmetry, speech difficulty, weakness, light-headedness, numbness and headaches.    Past Medical History:  Diagnosis Date  . Anxiety   . Arthritis   . Cauda equina syndrome ([place])   . Cervical spinal stenosis   . Fibromyalgia   . Herpes labialis   . Hyperlipidemia   . Hypertension   . Lumbar spinal stenosis   . Macular degeneration, right eye   . Migraine   . PE (pulmonary thromboembolism) ([place])   . Pulmonary embolism ([place])    age 54; not on OCP.  Coumadin x 2 years.  . Retinal vein occlusion    age 4.  Marland Kitchen Spinal stenosis of lumbar region 12/16/2015   L3-4 level   Past Surgical History:  Procedure Laterality Date  . ABDOMINAL SURGERY    . EYE SURGERY    . HAND SURGERY Left   .  ROTATOR CUFF REPAIR Right    Allergies  Allergen Reactions  . Demerol [Meperidine] Anaphylaxis  . Gabapentin Other (See Comments)  . Codeine     Other reaction(s): Other (See Comments)  . Other Other (See Comments)    Suicidal ideation Alcohol (drinking kind); takes liver enzymes and elevates them  . Percocet [Oxycodone-Acetaminophen] Nausea And Vomiting   Current Outpatient Medications on File Prior to Visit  Medication Sig Dispense Refill  . acyclovir  ointment (ZOVIRAX) 5 % Apply topically.    Marland Kitchen aspirin 81 MG tablet Take 81 mg by mouth daily.    Marland Kitchen atorvastatin (LIPITOR) 10 MG tablet take 1 tablet by mouth once daily 90 tablet 3  . buPROPion (WELLBUTRIN XL) 150 MG 24 hr tablet TAKE 1 TABLET BY MOUTH ONCE DAILY 90 tablet 0  . celecoxib (CELEBREX) 200 MG capsule take 1 capsule by mouth twice a day 180 capsule 3  . cephALEXin (KEFLEX) 500 MG capsule Take 1 capsule (500 mg total) by mouth 4 (four) times daily. 28 capsule 0  . DULoxetine (CYMBALTA) 30 MG capsule Take 1 capsule (30 mg total) by mouth daily. 90 capsule 1  . fluticasone (FLONASE) 50 MCG/ACT nasal spray Place 2 sprays into both nostrils daily. 16 g 12  . folic acid (FOLVITE) 1 MG tablet take 1 tablet by mouth once daily 90 tablet 3  . losartan (COZAAR) 25 MG tablet TAKE 1 TABLET BY MOUTH ONCE DAILY 90 tablet 0  . metoprolol succinate (TOPROL-XL) 200 MG 24 hr tablet Take 1 tablet (200 mg total) by mouth daily. Take with or immediately following a meal. 90 tablet 3  . nitroGLYCERIN (NITROSTAT) 0.4 MG SL tablet Place 1 tablet (0.4 mg total) under the tongue every 5 (five) minutes as needed for chest pain. 50 tablet 3  . OVER THE COUNTER MEDICATION OTC B6 100 mgm taking daily    . OVER THE COUNTER MEDICATION OTC preservision areds taking 1 in the am/qhs    . OVER THE COUNTER MEDICATION OTC B12-SL taking everyday    . OVER THE COUNTER MEDICATION Reported on 04/17/2015    . oxyCODONE-acetaminophen (PERCOCET/ROXICET) 5-325 MG tablet Take 1 tablet by mouth every 6 (six) hours as needed for severe pain. 30 tablet 0  . pregabalin (LYRICA) 25 MG capsule Take 25 mg by mouth 2 (two) times daily.    Marland Kitchen UNABLE TO FIND Uses Lidocaine topical cream @@ night    . valACYclovir (VALTREX) 500 MG tablet Take 1 tablet (500 mg total) by mouth 2 (two) times daily. 180 tablet 3  . zolpidem (AMBIEN) 5 MG tablet Take 1 tablet (5 mg total) by mouth at bedtime as needed for sleep. 30 tablet 5   No current  facility-administered medications on file prior to visit.    Social History   Socioeconomic History  . Marital status: Married    Spouse name: Herbie [place]  . Number of children: 2  . Years of education: Therapist, sports  . Highest education level: Not on file  Occupational History  . 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 Financial resource strain: Not on file  . Food insecurity:    Worry: Not on file    Inability: Not on file  . Transportation needs:    Medical: Not on file    Non-medical: Not on file  Tobacco Use  . Smoking status: Never Smoker  . Smokeless tobacco: Never Used  Substance and Sexual  Activity  . Alcohol use: Yes    Alcohol/week: 0.0 oz    Comment: 15 cc/wk  . Drug use: No  . Sexual activity: Never    Partners: Male    Comment: Married  Lifestyle  . Physical activity:    Days per week: Not on file    Minutes per session: Not on file  . Stress: Not on file  Relationships  . Social connections:    Talks on phone: Not on file    Gets together: Not on file    Attends religious service: Not on file    Active member of club or organization: Not on file    Attends meetings of clubs or organizations: Not on file    Relationship status: Not on file  . Intimate partner violence:    Fear of current or ex partner: Not on file    Emotionally abused: Not on file    Physically abused: Not on file    Forced sexual activity: Not on file  Other Topics Concern  . Not on file  Social History Narrative   Marital status:  Married x 47 years      Children: 2 children (49, 85); no granchildren      Lives: with husband, german shepard, kitkat/kitten      Employed: works once per week; has a Patent attorney one day per week; previous therapy practice in 2018.        Tobacco: never      Alcohol:  Socially; one glass of wine per week      Exercise:  Daily exercise; 3.0 miles per day      Advanced Directives:  FULL CODE; no prolonged  measures      ADLs: independent with ADLs.        Right-handed   Caffeine: 2 cups of coffee per day         Family History  Problem Relation Age of Onset  . Heart attack Mother   . Stroke Mother        CVA age 89; repeat at age 32 as cause of death.  . Diabetes Mother   . Heart disease Mother 75       recurrent AMIs  . Hyperlipidemia Mother   . Hypertension Mother   . Diabetes Father   . Heart attack Father   . Cancer Father 93       lung cancer  . Hyperlipidemia Father   . Hypertension Father   . Asthma Son        Objective:    BP 132/82   Pulse 61   Temp 98 F (36.7 C) (Oral)   Resp 16   Ht 5\' 5"  (1.651 m)   SpO2 96%   BMI 24.13 kg/m  Physical Exam  Constitutional: She is oriented to person, place, and time. She appears well-developed and well-nourished. No distress.  HENT:  Head: Normocephalic and atraumatic.  Nose: Nose normal.  Mouth/Throat: Oropharynx is clear and moist.  Eyes: Pupils are equal, round, and reactive to light. Conjunctivae and EOM are normal.  Neck: Normal range of motion. Neck supple. Carotid bruit is not present. No thyromegaly present.  Cardiovascular: Normal rate, regular rhythm, normal heart sounds and intact distal pulses. Exam reveals no gallop and no friction rub.  No murmur heard. Pulmonary/Chest: Effort normal and breath sounds normal. She has no wheezes. She has no rales.  Musculoskeletal:       Lumbar back: She exhibits pain. She exhibits normal range  of motion, no tenderness and no bony tenderness.  Lymphadenopathy:    She has no cervical adenopathy.  Neurological: She is alert and oriented to person, place, and time. No cranial nerve deficit.  Skin: Skin is warm and dry. No rash noted. She is not diaphoretic. No erythema. No pallor.  Psychiatric: She has a normal mood and affect. Her behavior is normal.   No results found. Depression screen [facility] 2/9 09/22/2017 09/20/2017 08/14/2017 07/15/2017 05/08/2017  Decreased Interest 0 0 1 0  0  Down, Depressed, Hopeless 0 0 1 0 0  PHQ - 2 Score 0 0 2 0 0  Altered sleeping - - 0 - -  Tired, decreased energy - - 0 - -  Change in appetite - - 0 - -  Feeling bad or failure about yourself  - - 0 - -  Trouble concentrating - - 0 - -  Moving slowly or fidgety/restless - - 0 - -  Suicidal thoughts - - 0 - -  PHQ-9 Score - - 2 - -  Difficult doing work/chores - - - - -  Some recent data might be hidden   Fall Risk  09/22/2017 09/20/2017 08/14/2017 07/15/2017 05/08/2017  Falls in the past year? No No No No No  Comment - - - - -  Number falls in past yr: - - - - -  Injury with Fall? - - - - -  Comment - - - - -  Risk Factor Category  - - - - -  Risk for fall due to : - - - - -  Risk for fall due to: Comment - - - - -        Assessment & Plan:   1. Left hip pain   2. Lumbar and sacral osteoarthritis   3. Slow transit constipation   4. Cauda equina syndrome (H[facility])     Acute left hip pain with history of lumbar and sacral osteoarthritis and cauda equina syndrome: Persistent yet patient has identified activity that is triggered left hip pain.  Recommend avoidance of activity that causes hip pain.  Recommend home stretching every day.  Consistent with musculoskeletal sprain of left hamstring.  Supportive care with medications, stretching, heat.  New onset constipation: Likely due to increase in opiate use as well as worsening cauda equina syndrome.  Patient declined rectal exam during visit today.  Initiate MiraLAX or Colace therapy daily to twice daily.  Also trial of Senokot-S or Peri-Colace recommended if necessary.  Return to clinic immediately if acutely worsens or has fecal incontinence or inability to urinate.  No orders of the defined types were placed in this encounter.  No orders of the defined types were placed in this encounter.   No follow-ups on file.   Sloan Takagi Elayne Guerin, M.D. Primary Care at [facility] previously [facility] [address] [place], [place]  [address] [phone] phone [phone] fax

## 2017-09-23 LAB — URINALYSIS, MICROSCOPIC ONLY
CASTS: NONE SEEN /LPF
EPITHELIAL CELLS (NON RENAL): NONE SEEN /HPF (ref 0–10)

## 2017-09-23 LAB — URINE CULTURE

## 2017-09-23 LAB — URINALYSIS, DIPSTICK ONLY
Bilirubin, UA: NEGATIVE
Glucose, UA: NEGATIVE
KETONES UA: NEGATIVE
NITRITE UA: POSITIVE — AB
PH UA: 7 (ref 5.0–7.5)
Protein, UA: NEGATIVE
RBC, UA: NEGATIVE
Specific Gravity, UA: 1.018 (ref 1.005–1.030)
Urobilinogen, Ur: 0.2 mg/dL (ref 0.2–1.0)

## 2017-09-25 ENCOUNTER — Ambulatory Visit: Payer: Medicare Other | Admitting: Family Medicine

## 2017-10-01 ENCOUNTER — Encounter (HOSPITAL_COMMUNITY): Payer: Self-pay | Admitting: Surgery

## 2017-10-01 NOTE — H&P (Signed)
General Surgery [facility]  Verl Blalock DOB: [birth year] Married / Language: English / Race: White Female   History of Present Illness   The patient is a 72 year old female who presents for evaluation of gall stones.  CHIEF COMPLAINT: cholelithiasis  The patient is referred by Dr. Reginia Forts for surgical evaluation and management of cholelithiasis. Patient has a known history of gallstones. Her most recent ultrasound examination was from Aug 02, 2017. This shows multiple gallstones. Common bile duct was mildly dilated at 8.9 mm. There were no signs of choledocholithiasis. Patient has had no prior history of hepatobiliary or pancreatic disease. She denies hepatitis or pancreatitis. She denies jaundice or acholic stools. There is a strong family history of gallbladder disease and the patient's mother and father. Patient has had 3 prior abdominal surgeries for pelvic and urologic reconstruction due to incontinence. She has also had a bilateral tubal ligation. She presents today to discuss cholecystectomy.   Past Surgical History Cataract Surgery  Bilateral. Shoulder Surgery  Right.  Diagnostic Studies History Colonoscopy  >10 years ago Mammogram  within last year Pap Smear  >5 years ago  Allergies No Known Drug Allergies [09/19/2017]: Allergies Reconciled   Medication History Lyrica (25MG  Capsule, Oral) Active. Atorvastatin Calcium (10MG  Tablet, Oral) Active. BuPROPion HCl ER (XL) (150MG  Tablet ER 24HR, Oral) Active. Celecoxib (200MG  Capsule, Oral) Active. DULoxetine HCl (30MG  Capsule DR Part, Oral) Active. Fluticasone Propionate (50MCG/ACT Suspension, Nasal) Active. Folic Acid (1MG  Tablet, Oral) Active. Losartan Potassium (25MG  Tablet, Oral) Active. Metoprolol Succinate ER (200MG  Tablet ER 24HR, Oral) Active. Ondansetron (8MG  Tablet Disint, Oral) Active. Rozerem (8MG  Tablet, Oral) Active. Aspirin (81MG  Tablet, Oral)  Active. Nitroglycerin (0.4MG  Tab Sublingual, Sublingual) Active. Medications Reconciled  Social History Alcohol use  Occasional alcohol use. Caffeine use  Carbonated beverages, Coffee, Tea. No drug use  Tobacco use  Never smoker.  Family History Anesthetic complications  Daughter. Cancer  Father. Cerebrovascular Accident  Mother. Depression  Daughter, Son. Diabetes Mellitus  Father, Mother, Son. Heart Disease  Family Members In General, Father, Mother. Heart disease in female family member before age 3  Hypertension  Father, Mother. Migraine Headache  Daughter. Respiratory Condition  Father, Son.  Pregnancy / Birth History Age at menarche  56 years. Age of menopause  48-55 Gravida  2 Irregular periods  Length (months) of breastfeeding  7-12 Maternal age  64-25 Para  2  Other Problems Arthritis  Back Pain  Bladder Problems  General anesthesia - complications  High blood pressure  Migraine Headache  Other disease, cancer, significant illness  Pulmonary Embolism / Blood Clot in Legs    Review of Systems General Not Present- Appetite Loss, Chills, Fatigue, Fever, Night Sweats, Weight Gain and Weight Loss. Skin Not Present- Change in Wart/Mole, Dryness, Hives, Jaundice, New Lesions, Non-Healing Wounds, Rash and Ulcer. HEENT Present- Wears glasses/contact lenses. Not Present- Earache, Hearing Loss, Hoarseness, Nose Bleed, Oral Ulcers, Ringing in the Ears, Seasonal Allergies, Sinus Pain, Sore Throat, Visual Disturbances and Yellow Eyes. Respiratory Not Present- Bloody sputum, Chronic Cough, Difficulty Breathing, Snoring and Wheezing. Breast Not Present- Breast Mass, Breast Pain, Nipple Discharge and Skin Changes. Cardiovascular Not Present- Chest Pain, Difficulty Breathing Lying Down, Leg Cramps, Palpitations, Rapid Heart Rate, Shortness of Breath and Swelling of Extremities. Gastrointestinal Not Present- Abdominal Pain, Bloating, Bloody  Stool, Change in Bowel Habits, Chronic diarrhea, Constipation, Difficulty Swallowing, Excessive gas, Gets full quickly at meals, Hemorrhoids, Indigestion, Nausea, Rectal Pain and Vomiting. Female Genitourinary Present- Frequency and  Urgency. Not Present- Nocturia, Painful Urination and Pelvic Pain. Musculoskeletal Present- Back Pain and Joint Pain. Not Present- Joint Stiffness, Muscle Pain, Muscle Weakness and Swelling of Extremities. Neurological Present- Tingling. Not Present- Decreased Memory, Fainting, Headaches, Numbness, Seizures, Tremor, Trouble walking and Weakness. Psychiatric Not Present- Anxiety, Bipolar, Change in Sleep Pattern, Depression, Fearful and Frequent crying. Endocrine Not Present- Cold Intolerance, Excessive Hunger, Hair Changes, Heat Intolerance, Hot flashes and New Diabetes. Hematology Not Present- Blood Thinners, Easy Bruising, Excessive bleeding, Gland problems, HIV and Persistent Infections.  Vitals Weight: 144.4 lb Height: 65in Body Surface Area: 1.72 m Body Mass Index: 24.03 kg/m  Temp.: 97.54F  Pulse: 70 (Regular)  BP: 124/84 (Sitting, Left Arm, Standard)   Physical Exam  See vital signs recorded above  GENERAL APPEARANCE Development: normal Nutritional status: normal Gross deformities: none  SKIN Rash, lesions, ulcers: none Induration, erythema: none Nodules: none palpable  EYES Conjunctiva and lids: normal Pupils: equal and reactive Iris: normal bilaterally  EARS, NOSE, MOUTH, THROAT External ears: no lesion or deformity External nose: no lesion or deformity Hearing: grossly normal Lips: no lesion or deformity Dentition: normal for age Oral mucosa: moist  NECK Symmetric: yes Trachea: midline Thyroid: no palpable nodules in the thyroid bed  CHEST Respiratory effort: normal Retraction or accessory muscle use: no Breath sounds: normal bilaterally Rales, rhonchi, wheeze: none  CARDIOVASCULAR Auscultation: regular rhythm,  normal rate Murmurs: none Pulses: carotid and radial pulse 2+ palpable Lower extremity edema: none Lower extremity varicosities: none  ABDOMEN Distension: none Masses: none palpable Tenderness: none Hepatosplenomegaly: not present Hernia: not present Well-healed surgical incisions at umbilicus and in the Pfannenstiel location  MUSCULOSKELETAL Station and gait: normal Digits and nails: no clubbing or cyanosis Muscle strength: grossly normal all extremities Range of motion: grossly normal all extremities Deformity: none  LYMPHATIC Cervical: none palpable Supraclavicular: none palpable  PSYCHIATRIC Oriented to person, place, and time: yes Mood and affect: normal for situation Judgment and insight: appropriate for situation    Assessment & Plan  CHOLELITHIASIS (K80.20) COMMON BILE DUCT DILATION (K83.8)  Pt Education - Pamphlet Given - Laparoscopic Gallbladder Surgery: discussed with patient and provided information.  Patient presents on referral from her primary care physician to discuss cholecystectomy for management of cholelithiasis and history of common bile duct dilatation. Patient is presented with written literature on gallbladder surgery to review at home.  Patient has multiple gallstones. These have been noted on several prior studies. She also has a mildly dilated common bile duct which is improved slightly but currently measures 8.9 mm. Patient has been essentially asymptomatic. There is a strong family history of gallbladder disease in both her mother and her father. Due to other significant medical concerns, the patient would like to proceed with cholecystectomy on an elective basis.  We discussed laparoscopic cholecystectomy in detail. We discussed intraoperative cholangiography. We discussed the potential for conversion to open surgery. We discussed the hospital stay to be anticipated and her postoperative recovery. Patient understands and wishes to  proceed with surgery in the near future.  The risks and benefits of the procedure have been discussed at length with the patient. The patient understands the proposed procedure, potential alternative treatments, and the course of recovery to be expected. All of the patient's questions have been answered at this time. The patient wishes to proceed with surgery.  Armandina Gemma, [place] [facility] Office: [phone]

## 2017-10-02 ENCOUNTER — Encounter (HOSPITAL_COMMUNITY): Payer: Self-pay

## 2017-10-02 NOTE — Patient Instructions (Addendum)
Your procedure is scheduled on: Friday, Aug. 2, 2019   Surgery Time:  7:30AM-9:00AM   Report to [place]  Entrance    Report to admitting at 5:30 AM   Call this number if you have problems the morning of surgery [phone]   Do not eat food or drink liquids :After Midnight.   Do NOT smoke after Midnight   Take these medicines the morning of surgery with A SIP OF WATER: Bupropion, Duloxetine,    Use Flonase per normal routine                               You may not have any metal on your body including hair pins, jewelry, and body piercings             Do not wear make-up, lotions, powders, perfumes/cologne, or deodorant             Do not wear nail polish.  Do not shave  48 hours prior to surgery.                Do not bring valuables to the hospital. [place].   Contacts, dentures or bridgework may not be worn into surgery.   Leave suitcase in the car. After surgery it may be brought to your room.   Special Instructions: Bring a copy of your healthcare power of attorney and living will documents         the day of surgery if you haven't scanned them in before.              Please read over the following fact sheets you were given:  [facility] - Preparing for Surgery Before surgery, you can play an important role.  Because skin is not sterile, your skin needs to be as free of germs as possible.  You can reduce the number of germs on your skin by washing with CHG (chlorahexidine gluconate) soap before surgery.  CHG is an antiseptic cleaner which kills germs and bonds with the skin to continue killing germs even after washing. Please DO NOT use if you have an allergy to CHG or antibacterial soaps.  If your skin becomes reddened/irritated stop using the CHG and inform your nurse when you arrive at Short Stay. Do not shave (including legs and underarms) for at least 48 hours prior to the first CHG shower.   You may shave your face/neck.  Please follow these instructions carefully:  1.  Shower with CHG Soap the night before surgery and the  morning of surgery.  2.  If you choose to wash your hair, wash your hair first as usual with your normal  shampoo.  3.  After you shampoo, rinse your hair and body thoroughly to remove the shampoo.                             4.  Use CHG as you would any other liquid soap.  You can apply chg directly to the skin and wash.  Gently with a scrungie or clean washcloth.  5.  Apply the CHG Soap to your body ONLY FROM THE NECK DOWN.   Do   not use on face/ open  Wound or open sores. Avoid contact with eyes, ears mouth and   genitals (private parts).                       Wash face,  Genitals (private parts) with your normal soap.             6.  Wash thoroughly, paying special attention to the area where your    surgery  will be performed.  7.  Thoroughly rinse your body with warm water from the neck down.  8.  DO NOT shower/wash with your normal soap after using and rinsing off the CHG Soap.                9.  Pat yourself dry with a clean towel.            10.  Wear clean pajamas.            11.  Place clean sheets on your bed the night of your first shower and do not  sleep with pets. Day of Surgery : Do not apply any lotions/deodorants the morning of surgery.  Please wear clean clothes to the hospital/surgery center.  FAILURE TO FOLLOW THESE INSTRUCTIONS MAY RESULT IN THE CANCELLATION OF YOUR SURGERY  PATIENT SIGNATURE_________________________________  NURSE SIGNATURE__________________________________  ________________________________________________________________________

## 2017-10-03 ENCOUNTER — Encounter (HOSPITAL_COMMUNITY)
Admission: RE | Admit: 2017-10-03 | Discharge: 2017-10-03 | Disposition: A | Discharge status: Home / Self Care | Payer: Medicare Other | Source: Ambulatory Visit | Attending: Surgery | Admitting: Surgery

## 2017-10-03 ENCOUNTER — Other Ambulatory Visit: Payer: Self-pay

## 2017-10-03 ENCOUNTER — Encounter (HOSPITAL_COMMUNITY): Payer: Self-pay

## 2017-10-03 DIAGNOSIS — Z888 Allergy status to other drugs, medicaments and biological substances status: Secondary | ICD-10-CM | POA: Diagnosis not present

## 2017-10-03 DIAGNOSIS — I2510 Atherosclerotic heart disease of native coronary artery without angina pectoris: Secondary | ICD-10-CM | POA: Diagnosis not present

## 2017-10-03 DIAGNOSIS — I10 Essential (primary) hypertension: Secondary | ICD-10-CM | POA: Diagnosis not present

## 2017-10-03 DIAGNOSIS — F419 Anxiety disorder, unspecified: Secondary | ICD-10-CM | POA: Diagnosis not present

## 2017-10-03 DIAGNOSIS — Z86711 Personal history of pulmonary embolism: Secondary | ICD-10-CM | POA: Diagnosis not present

## 2017-10-03 DIAGNOSIS — Z79899 Other long term (current) drug therapy: Secondary | ICD-10-CM | POA: Diagnosis not present

## 2017-10-03 DIAGNOSIS — Z7982 Long term (current) use of aspirin: Secondary | ICD-10-CM | POA: Diagnosis not present

## 2017-10-03 DIAGNOSIS — K838 Other specified diseases of biliary tract: Secondary | ICD-10-CM | POA: Diagnosis not present

## 2017-10-03 DIAGNOSIS — M549 Dorsalgia, unspecified: Secondary | ICD-10-CM | POA: Diagnosis not present

## 2017-10-03 DIAGNOSIS — M797 Fibromyalgia: Secondary | ICD-10-CM | POA: Diagnosis not present

## 2017-10-03 DIAGNOSIS — Z86718 Personal history of other venous thrombosis and embolism: Secondary | ICD-10-CM | POA: Diagnosis not present

## 2017-10-03 DIAGNOSIS — G8929 Other chronic pain: Secondary | ICD-10-CM | POA: Diagnosis not present

## 2017-10-03 DIAGNOSIS — Z885 Allergy status to narcotic agent status: Secondary | ICD-10-CM | POA: Diagnosis not present

## 2017-10-03 DIAGNOSIS — K8010 Calculus of gallbladder with chronic cholecystitis without obstruction: Secondary | ICD-10-CM | POA: Diagnosis not present

## 2017-10-03 DIAGNOSIS — Z8249 Family history of ischemic heart disease and other diseases of the circulatory system: Secondary | ICD-10-CM | POA: Diagnosis not present

## 2017-10-03 DIAGNOSIS — M1990 Unspecified osteoarthritis, unspecified site: Secondary | ICD-10-CM | POA: Diagnosis not present

## 2017-10-03 HISTORY — DX: Other intervertebral disc degeneration, lumbar region without mention of lumbar back pain or lower extremity pain: M51.369

## 2017-10-03 HISTORY — DX: Personal history of urinary calculi: Z87.442

## 2017-10-03 HISTORY — DX: Radiculopathy, lumbar region: M54.16

## 2017-10-03 HISTORY — DX: Unspecified Escherichia coli (E. coli) as the cause of diseases classified elsewhere: B96.20

## 2017-10-03 HISTORY — DX: Trigger finger, unspecified finger: M65.30

## 2017-10-03 HISTORY — DX: Spondylolysis, cervical region: M43.02

## 2017-10-03 HISTORY — DX: Actinic keratosis: L57.0

## 2017-10-03 HISTORY — DX: Other forms of scoliosis, site unspecified: M41.80

## 2017-10-03 HISTORY — DX: Unspecified fracture of shaft of unspecified fibula, initial encounter for closed fracture: S82.409A

## 2017-10-03 HISTORY — DX: Other intervertebral disc degeneration, lumbar region: M51.36

## 2017-10-03 HISTORY — DX: Spondylosis without myelopathy or radiculopathy, lumbar region: M47.816

## 2017-10-03 HISTORY — DX: Cardiomegaly: I51.7

## 2017-10-03 HISTORY — DX: Other specified postprocedural states: Z98.890

## 2017-10-03 HISTORY — DX: Other chronic pain: G89.29

## 2017-10-03 HISTORY — DX: Urinary tract infection, site not specified: N39.0

## 2017-10-03 HISTORY — DX: Calculus of gallbladder without cholecystitis without obstruction: K80.20

## 2017-10-03 HISTORY — DX: Other specified diseases of liver: K76.89

## 2017-10-03 HISTORY — DX: Atherosclerotic heart disease of native coronary artery without angina pectoris: I25.10

## 2017-10-03 HISTORY — DX: Insomnia, unspecified: G47.00

## 2017-10-03 HISTORY — DX: Trochanteric bursitis, unspecified hip: M70.60

## 2017-10-03 HISTORY — DX: Herpesviral infection, unspecified: B00.9

## 2017-10-03 HISTORY — DX: Other specified postprocedural states: R11.2

## 2017-10-03 HISTORY — DX: Unspecified osteoarthritis, unspecified site: M19.90

## 2017-10-03 HISTORY — DX: Other seborrheic keratosis: L82.1

## 2017-10-03 HISTORY — DX: Personal history of other diseases of the digestive system: Z87.19

## 2017-10-03 HISTORY — DX: Atherosclerosis of aorta: I70.0

## 2017-10-03 HISTORY — DX: Constipation, unspecified: K59.00

## 2017-10-03 HISTORY — DX: Dorsalgia, unspecified: M54.9

## 2017-10-03 HISTORY — DX: Full incontinence of feces: R15.9

## 2017-10-03 HISTORY — DX: Unspecified urinary incontinence: R32

## 2017-10-03 HISTORY — DX: Other fracture of unspecified lower leg, initial encounter for closed fracture: S82.899A

## 2017-10-03 HISTORY — DX: Unspecified macular degeneration: H35.30

## 2017-10-03 HISTORY — DX: Cyst of kidney, acquired: N28.1

## 2017-10-03 HISTORY — DX: Other specified enthesopathies of left lower limb, excluding foot: M76.892

## 2017-10-03 LAB — CBC
HEMATOCRIT: 43.8 % (ref 36.0–46.0)
Hemoglobin: 14.3 g/dL (ref 12.0–15.0)
MCH: 30.7 pg (ref 26.0–34.0)
MCHC: 32.6 g/dL (ref 30.0–36.0)
MCV: 94 fL (ref 78.0–100.0)
Platelets: 248 10*3/uL (ref 150–400)
RBC: 4.66 MIL/uL (ref 3.87–5.11)
RDW: 13.1 % (ref 11.5–15.5)
WBC: 8.1 10*3/uL (ref 4.0–10.5)
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
## 2017-10-06 ENCOUNTER — Observation Stay (HOSPITAL_COMMUNITY)
Admission: RE | Admit: 2017-10-06 | Discharge: 2017-10-07 | Disposition: A | Discharge status: Home / Self Care | Payer: Medicare Other | Source: Ambulatory Visit | Attending: Surgery | Admitting: Surgery

## 2017-10-06 ENCOUNTER — Encounter (HOSPITAL_COMMUNITY): Payer: Self-pay | Admitting: *Deleted

## 2017-10-06 ENCOUNTER — Other Ambulatory Visit: Payer: Self-pay

## 2017-10-06 ENCOUNTER — Ambulatory Visit (HOSPITAL_COMMUNITY): Payer: Medicare Other

## 2017-10-06 ENCOUNTER — Ambulatory Visit (HOSPITAL_COMMUNITY): Payer: Medicare Other | Admitting: Anesthesiology

## 2017-10-06 ENCOUNTER — Encounter (HOSPITAL_COMMUNITY)
Admission: RE | Disposition: A | Discharge status: Home / Self Care | Payer: Self-pay | Source: Ambulatory Visit | Attending: Surgery

## 2017-10-06 DIAGNOSIS — Z86711 Personal history of pulmonary embolism: Secondary | ICD-10-CM | POA: Insufficient documentation

## 2017-10-06 DIAGNOSIS — M549 Dorsalgia, unspecified: Secondary | ICD-10-CM | POA: Diagnosis not present

## 2017-10-06 DIAGNOSIS — M797 Fibromyalgia: Secondary | ICD-10-CM | POA: Insufficient documentation

## 2017-10-06 DIAGNOSIS — G8929 Other chronic pain: Secondary | ICD-10-CM | POA: Insufficient documentation

## 2017-10-06 DIAGNOSIS — K838 Other specified diseases of biliary tract: Secondary | ICD-10-CM | POA: Diagnosis not present

## 2017-10-06 DIAGNOSIS — I10 Essential (primary) hypertension: Secondary | ICD-10-CM | POA: Insufficient documentation

## 2017-10-06 DIAGNOSIS — E7800 Pure hypercholesterolemia, unspecified: Secondary | ICD-10-CM | POA: Diagnosis not present

## 2017-10-06 DIAGNOSIS — Z7982 Long term (current) use of aspirin: Secondary | ICD-10-CM | POA: Diagnosis not present

## 2017-10-06 DIAGNOSIS — Z8249 Family history of ischemic heart disease and other diseases of the circulatory system: Secondary | ICD-10-CM | POA: Insufficient documentation

## 2017-10-06 DIAGNOSIS — Z888 Allergy status to other drugs, medicaments and biological substances status: Secondary | ICD-10-CM | POA: Diagnosis not present

## 2017-10-06 DIAGNOSIS — F419 Anxiety disorder, unspecified: Secondary | ICD-10-CM | POA: Insufficient documentation

## 2017-10-06 DIAGNOSIS — K8010 Calculus of gallbladder with chronic cholecystitis without obstruction: Secondary | ICD-10-CM | POA: Diagnosis present

## 2017-10-06 DIAGNOSIS — I2510 Atherosclerotic heart disease of native coronary artery without angina pectoris: Secondary | ICD-10-CM | POA: Insufficient documentation

## 2017-10-06 DIAGNOSIS — K8020 Calculus of gallbladder without cholecystitis without obstruction: Secondary | ICD-10-CM | POA: Diagnosis not present

## 2017-10-06 DIAGNOSIS — Z885 Allergy status to narcotic agent status: Secondary | ICD-10-CM | POA: Insufficient documentation

## 2017-10-06 DIAGNOSIS — K828 Other specified diseases of gallbladder: Secondary | ICD-10-CM | POA: Diagnosis not present

## 2017-10-06 DIAGNOSIS — M1990 Unspecified osteoarthritis, unspecified site: Secondary | ICD-10-CM | POA: Insufficient documentation

## 2017-10-06 DIAGNOSIS — Z86718 Personal history of other venous thrombosis and embolism: Secondary | ICD-10-CM | POA: Insufficient documentation

## 2017-10-06 DIAGNOSIS — Z79899 Other long term (current) drug therapy: Secondary | ICD-10-CM | POA: Insufficient documentation

## 2017-10-06 HISTORY — PX: CHOLECYSTECTOMY: SHX55

## 2017-10-06 SURGERY — LAPAROSCOPIC CHOLECYSTECTOMY WITH INTRAOPERATIVE CHOLANGIOGRAM
Anesthesia: General | Site: Abdomen

## 2017-10-06 MED ORDER — ONDANSETRON HCL 4 MG/2ML IJ SOLN: 4.0000 mg | Freq: Once | INTRAMUSCULAR | Status: DC | PRN

## 2017-10-06 MED ORDER — BUPIVACAINE-EPINEPHRINE (PF) 0.25% -1:200000 IJ SOLN
INTRAMUSCULAR | Status: DC | PRN
  Administered 2017-10-06: 20 mL

## 2017-10-06 MED ORDER — FAMOTIDINE 20 MG PO TABS
20.0000 mg | ORAL_TABLET | Freq: Once | ORAL | Status: AC
  Administered 2017-10-06: 20 mg via ORAL
  Filled 2017-10-06: qty 1

## 2017-10-06 MED ORDER — ACETAMINOPHEN 160 MG/5ML PO SOLN: 960.0000 mg | Freq: Once | ORAL | Status: AC

## 2017-10-06 MED ORDER — SUGAMMADEX SODIUM 200 MG/2ML IV SOLN
INTRAVENOUS | Status: DC | PRN
  Administered 2017-10-06: 150 mg via INTRAVENOUS

## 2017-10-06 MED ORDER — LOSARTAN POTASSIUM 25 MG PO TABS
25.0000 mg | ORAL_TABLET | Freq: Every day | ORAL | Status: DC
  Administered 2017-10-06: 25 mg via ORAL
  Filled 2017-10-06: qty 1

## 2017-10-06 MED ORDER — CELECOXIB 200 MG PO CAPS
200.0000 mg | ORAL_CAPSULE | Freq: Once | ORAL | Status: AC | PRN
  Administered 2017-10-06: 200 mg via ORAL
  Filled 2017-10-06: qty 1

## 2017-10-06 MED ORDER — HYDROMORPHONE HCL 1 MG/ML IJ SOLN
1.0000 mg | INTRAMUSCULAR | Status: DC | PRN
  Administered 2017-10-06 (×2): 1 mg via INTRAVENOUS
  Filled 2017-10-06 (×2): qty 1

## 2017-10-06 MED ORDER — ONDANSETRON HCL 4 MG/2ML IJ SOLN
4.0000 mg | Freq: Four times a day (QID) | INTRAMUSCULAR | Status: DC | PRN
  Administered 2017-10-06 (×2): 4 mg via INTRAVENOUS
  Filled 2017-10-06 (×2): qty 2

## 2017-10-06 MED ORDER — CEFAZOLIN SODIUM-DEXTROSE 2-4 GM/100ML-% IV SOLN
2.0000 g | INTRAVENOUS | Status: AC
  Administered 2017-10-06: 2 g via INTRAVENOUS
  Filled 2017-10-06: qty 100

## 2017-10-06 MED ORDER — LACTATED RINGERS IR SOLN
Status: DC | PRN
  Administered 2017-10-06: 1000 mL

## 2017-10-06 MED ORDER — FENTANYL CITRATE (PF) 250 MCG/5ML IJ SOLN
INTRAMUSCULAR | Status: DC | PRN
  Administered 2017-10-06 (×3): 50 ug via INTRAVENOUS

## 2017-10-06 MED ORDER — PROPOFOL 10 MG/ML IV BOLUS
INTRAVENOUS | Status: DC | PRN
  Administered 2017-10-06: 20 mg via INTRAVENOUS
  Administered 2017-10-06: 100 mg via INTRAVENOUS
  Administered 2017-10-06: 50 mg via INTRAVENOUS

## 2017-10-06 MED ORDER — IOPAMIDOL (ISOVUE-300) INJECTION 61%
INTRAVENOUS | Status: AC
  Filled 2017-10-06: qty 50

## 2017-10-06 MED ORDER — PREGABALIN 25 MG PO CAPS
25.0000 mg | ORAL_CAPSULE | Freq: Every day | ORAL | Status: DC
  Administered 2017-10-06: 25 mg via ORAL
  Filled 2017-10-06: qty 1

## 2017-10-06 MED ORDER — MIDAZOLAM HCL 2 MG/2ML IJ SOLN
INTRAMUSCULAR | Status: DC | PRN
  Administered 2017-10-06: 2 mg via INTRAVENOUS

## 2017-10-06 MED ORDER — KCL IN DEXTROSE-NACL 20-5-0.45 MEQ/L-%-% IV SOLN
INTRAVENOUS | Status: DC
  Administered 2017-10-06: 11:00:00 via INTRAVENOUS
  Filled 2017-10-06: qty 1000

## 2017-10-06 MED ORDER — PROPOFOL 10 MG/ML IV BOLUS
INTRAVENOUS | Status: AC
  Filled 2017-10-06: qty 20

## 2017-10-06 MED ORDER — ACETAMINOPHEN 325 MG PO TABS
650.0000 mg | ORAL_TABLET | Freq: Four times a day (QID) | ORAL | Status: DC | PRN
  Administered 2017-10-06 – 2017-10-07 (×2): 650 mg via ORAL
  Filled 2017-10-06 (×2): qty 2

## 2017-10-06 MED ORDER — ONDANSETRON 4 MG PO TBDP: 4.0000 mg | ORAL_TABLET | Freq: Four times a day (QID) | ORAL | Status: DC | PRN

## 2017-10-06 MED ORDER — BUPIVACAINE-EPINEPHRINE (PF) 0.25% -1:200000 IJ SOLN
INTRAMUSCULAR | Status: AC
  Filled 2017-10-06: qty 30

## 2017-10-06 MED ORDER — DEXAMETHASONE SODIUM PHOSPHATE 10 MG/ML IJ SOLN
INTRAMUSCULAR | Status: DC | PRN
  Administered 2017-10-06: 10 mg via INTRAVENOUS

## 2017-10-06 MED ORDER — CHLORHEXIDINE GLUCONATE CLOTH 2 % EX PADS: 6.0000 | MEDICATED_PAD | Freq: Once | CUTANEOUS | Status: DC

## 2017-10-06 MED ORDER — METOPROLOL SUCCINATE ER 50 MG PO TB24
200.0000 mg | ORAL_TABLET | Freq: Every evening | ORAL | Status: DC
  Administered 2017-10-06: 200 mg via ORAL
  Filled 2017-10-06: qty 4

## 2017-10-06 MED ORDER — 0.9 % SODIUM CHLORIDE (POUR BTL) OPTIME
TOPICAL | Status: DC | PRN
  Administered 2017-10-06: 1000 mL

## 2017-10-06 MED ORDER — FENTANYL CITRATE (PF) 100 MCG/2ML IJ SOLN
25.0000 ug | INTRAMUSCULAR | Status: DC | PRN
  Administered 2017-10-06: 25 ug via INTRAVENOUS
  Administered 2017-10-06: 50 ug via INTRAVENOUS

## 2017-10-06 MED ORDER — ACETAMINOPHEN 650 MG RE SUPP: 650.0000 mg | Freq: Four times a day (QID) | RECTAL | Status: DC | PRN

## 2017-10-06 MED ORDER — PROPOFOL 500 MG/50ML IV EMUL
INTRAVENOUS | Status: DC | PRN
  Administered 2017-10-06: 25 ug/kg/min via INTRAVENOUS

## 2017-10-06 MED ORDER — PHENYLEPHRINE 40 MCG/ML (10ML) SYRINGE FOR IV PUSH (FOR BLOOD PRESSURE SUPPORT)
PREFILLED_SYRINGE | INTRAVENOUS | Status: DC | PRN
  Administered 2017-10-06 (×3): 40 ug via INTRAVENOUS
  Administered 2017-10-06: 80 ug via INTRAVENOUS
  Administered 2017-10-06: 40 ug via INTRAVENOUS

## 2017-10-06 MED ORDER — BUPROPION HCL ER (XL) 150 MG PO TB24: 150.0000 mg | ORAL_TABLET | Freq: Every day | ORAL | Status: DC

## 2017-10-06 MED ORDER — ACETAMINOPHEN 500 MG PO TABS
1000.0000 mg | ORAL_TABLET | Freq: Once | ORAL | Status: AC
  Administered 2017-10-06: 1000 mg via ORAL
  Filled 2017-10-06: qty 2

## 2017-10-06 MED ORDER — TRAMADOL HCL 50 MG PO TABS
50.0000 mg | ORAL_TABLET | Freq: Four times a day (QID) | ORAL | Status: DC | PRN
  Filled 2017-10-06: qty 1

## 2017-10-06 MED ORDER — FLUTICASONE PROPIONATE 50 MCG/ACT NA SUSP
2.0000 | Freq: Every day | NASAL | Status: DC
  Filled 2017-10-06: qty 16

## 2017-10-06 MED ORDER — ROCURONIUM BROMIDE 10 MG/ML (PF) SYRINGE
PREFILLED_SYRINGE | INTRAVENOUS | Status: DC | PRN
  Administered 2017-10-06: 10 mg via INTRAVENOUS
  Administered 2017-10-06: 30 mg via INTRAVENOUS

## 2017-10-06 MED ORDER — CELECOXIB 200 MG PO CAPS
200.0000 mg | ORAL_CAPSULE | Freq: Two times a day (BID) | ORAL | Status: DC
  Administered 2017-10-06: 200 mg via ORAL
  Filled 2017-10-06: qty 1

## 2017-10-06 MED ORDER — IOPAMIDOL (ISOVUE-300) INJECTION 61%
INTRAVENOUS | Status: DC | PRN
  Administered 2017-10-06: 10 mL

## 2017-10-06 MED ORDER — MIDAZOLAM HCL 2 MG/2ML IJ SOLN
INTRAMUSCULAR | Status: AC
  Filled 2017-10-06: qty 2

## 2017-10-06 MED ORDER — FENTANYL CITRATE (PF) 100 MCG/2ML IJ SOLN
INTRAMUSCULAR | Status: AC
  Filled 2017-10-06: qty 2

## 2017-10-06 MED ORDER — DULOXETINE HCL 30 MG PO CPEP: 30.0000 mg | ORAL_CAPSULE | Freq: Every day | ORAL | Status: DC

## 2017-10-06 MED ORDER — LACTATED RINGERS IV SOLN
INTRAVENOUS | Status: DC
  Administered 2017-10-06: 07:00:00 via INTRAVENOUS

## 2017-10-06 MED ORDER — LIDOCAINE 2% (20 MG/ML) 5 ML SYRINGE
INTRAMUSCULAR | Status: DC | PRN
  Administered 2017-10-06 (×2): 40 mg via INTRAVENOUS

## 2017-10-06 MED ORDER — ZOLPIDEM TARTRATE 5 MG PO TABS
5.0000 mg | ORAL_TABLET | Freq: Every day | ORAL | Status: DC
  Administered 2017-10-06: 5 mg via ORAL
  Filled 2017-10-06: qty 1

## 2017-10-06 MED ORDER — FENTANYL CITRATE (PF) 250 MCG/5ML IJ SOLN
INTRAMUSCULAR | Status: AC
  Filled 2017-10-06: qty 5

## 2017-10-06 MED ORDER — ONDANSETRON HCL 4 MG/2ML IJ SOLN
INTRAMUSCULAR | Status: DC | PRN
  Administered 2017-10-06: 4 mg via INTRAVENOUS

## 2017-10-06 MED ORDER — ONDANSETRON HCL 4 MG/2ML IJ SOLN
INTRAMUSCULAR | Status: AC
  Filled 2017-10-06: qty 2

## 2017-10-06 MED ORDER — OXYCODONE HCL 5 MG PO TABS
5.0000 mg | ORAL_TABLET | ORAL | Status: DC | PRN
  Administered 2017-10-06: 5 mg via ORAL
  Filled 2017-10-06: qty 1

## 2017-10-06 SURGICAL SUPPLY — 31 items
APPLIER CLIP ROT 10 11.4 M/L (STAPLE) ×3
CABLE HIGH FREQUENCY MONO STRZ (ELECTRODE) ×3 IMPLANT
CHLORAPREP W/TINT 26ML (MISCELLANEOUS) ×6 IMPLANT
CLIP APPLIE ROT 10 11.4 M/L (STAPLE) ×1 IMPLANT
CLOSURE WOUND 1/2 X4 (GAUZE/BANDAGES/DRESSINGS) ×1
COVER MAYO STAND STRL (DRAPES) ×3 IMPLANT
COVER SURGICAL LIGHT HANDLE (MISCELLANEOUS) ×3 IMPLANT
DECANTER SPIKE VIAL GLASS SM (MISCELLANEOUS) ×3 IMPLANT
DRAPE C-ARM 42X120 X-RAY (DRAPES) ×3 IMPLANT
ELECT REM PT RETURN 15FT ADLT (MISCELLANEOUS) ×3 IMPLANT
GAUZE SPONGE 2X2 8PLY STRL LF (GAUZE/BANDAGES/DRESSINGS) ×1 IMPLANT
GLOVE SURG ORTHO 8.0 STRL STRW (GLOVE) ×3 IMPLANT
GOWN STRL REUS W/TWL XL LVL3 (GOWN DISPOSABLE) ×6 IMPLANT
HEMOSTAT SURGICEL 4X8 (HEMOSTASIS) IMPLANT
KIT BASIN OR (CUSTOM PROCEDURE TRAY) ×3 IMPLANT
POUCH SPECIMEN RETRIEVAL 10MM (ENDOMECHANICALS) ×3 IMPLANT
SCISSORS LAP 5X35 DISP (ENDOMECHANICALS) ×3 IMPLANT
SET CHOLANGIOGRAPH MIX (MISCELLANEOUS) ×3 IMPLANT
SET IRRIG TUBING LAPAROSCOPIC (IRRIGATION / IRRIGATOR) ×3 IMPLANT
SLEEVE XCEL OPT CAN 5 100 (ENDOMECHANICALS) ×3 IMPLANT
SPONGE GAUZE 2X2 STER 10/PKG (GAUZE/BANDAGES/DRESSINGS) ×2
STRIP CLOSURE SKIN 1/2X4 (GAUZE/BANDAGES/DRESSINGS) ×2 IMPLANT
SUT MNCRL AB 4-0 PS2 18 (SUTURE) ×3 IMPLANT
TAPE CLOTH SURG 4X10 WHT LF (GAUZE/BANDAGES/DRESSINGS) ×3 IMPLANT
TOWEL OR 17X26 10 PK STRL BLUE (TOWEL DISPOSABLE) ×3 IMPLANT
TOWEL OR NON WOVEN STRL DISP B (DISPOSABLE) ×3 IMPLANT
TRAY LAPAROSCOPIC (CUSTOM PROCEDURE TRAY) ×3 IMPLANT
TROCAR BLADELESS OPT 5 100 (ENDOMECHANICALS) ×3 IMPLANT
TROCAR XCEL BLUNT TIP 100MML (ENDOMECHANICALS) ×3 IMPLANT
TROCAR XCEL NON-BLD 11X100MML (ENDOMECHANICALS) ×3 IMPLANT
TUBING INSUF HEATED (TUBING) IMPLANT

## 2017-10-06 NOTE — Progress Notes (Signed)
Pt request prn pain med for incisional pain and headache. 1mg  IV Dilaudid given. Pt concerned about headache; states head hurts worse than incision. Vitals checked: HR 58 and BP 116/74. Pt resting in bed - will continue to monitor.

## 2017-10-06 NOTE — Anesthesia Procedure Notes (Addendum)
Procedure Name: Intubation Date/Time: 10/06/2017 7:35 AM Performed by: Cynda Familia, CRNA Pre-anesthesia Checklist: Patient identified, Emergency Drugs available, Suction available and Patient being monitored Patient Re-evaluated:Patient Re-evaluated prior to induction Oxygen Delivery Method: Circle System Utilized Preoxygenation: Pre-oxygenation with 100% oxygen Induction Type: IV induction Ventilation: Mask ventilation without difficulty Grade View: Grade I Tube type: Oral Number of attempts: 1 Airway Equipment and Method: Stylet and Video-laryngoscopy Placement Confirmation: ETT inserted through vocal cords under direct vision,  positive ETCO2 and breath sounds checked- equal and bilateral Secured at: 22 cm Tube secured with: Tape Dental Injury: Teeth and Oropharynx as per pre-operative assessment  Comments: Smooth IV induction Ellender-- intubation AM CRNA atraumatic -- teeth and mouth as preop ( small chips front teeth)-- bilat BS Ellender

## 2017-10-06 NOTE — Progress Notes (Signed)
Pt up to bathroom; difficulty with starting urination at this time, wishes to remain in bathroom for a few minutes to see if she voids. States she has only voided once since arriving on floor from PACU today at 1100. MD notified; awaiting callback. Will continue to monitor.

## 2017-10-06 NOTE — Anesthesia Procedure Notes (Signed)
Date/Time: 10/06/2017 7:20 AM Performed by: Cynda Familia, CRNA Pre-anesthesia Checklist: Patient identified and Patient being monitored Oxygen Delivery Method: Simple face mask Placement Confirmation: breath sounds checked- equal and bilateral Dental Injury: Teeth and Oropharynx as per pre-operative assessment  Comments: O2 for transport--- deep sedation prior to OR arrival as per pt request

## 2017-10-06 NOTE — Interval H&P Note (Signed)
History and Physical Interval Note:  10/06/2017 7:09 AM  Kaylee Weiss  has presented today for surgery, with the diagnosis of cholelithiasis, common bile duct dilatation.  The various methods of treatment have been discussed with the patient and family. After consideration of risks, benefits and other options for treatment, the patient has consented to    Procedure(s): LAPAROSCOPIC CHOLECYSTECTOMY WITH INTRAOPERATIVE CHOLANGIOGRAM (N/A) as a surgical intervention.  The patient's history has been reviewed, patient examined, no change in status, stable for surgery.  I have reviewed the patient's chart and labs.  Questions were answered to the patient's satisfaction.    Armandina Gemma, [place] [facility] Office: [place]

## 2017-10-06 NOTE — Anesthesia Procedure Notes (Signed)
Date/Time: 10/06/2017 8:49 AM Performed by: Cynda Familia, CRNA Oxygen Delivery Method: Simple face mask Placement Confirmation: positive ETCO2 and breath sounds checked- equal and bilateral Dental Injury: Teeth and Oropharynx as per pre-operative assessment

## 2017-10-06 NOTE — Transfer of Care (Signed)
Immediate Anesthesia Transfer of Care Note  Patient: Timber Marshman  Procedure(s) Performed: LAPAROSCOPIC CHOLECYSTECTOMY WITH INTRAOPERATIVE CHOLANGIOGRAM (N/A Abdomen)  Patient Location: PACU  Anesthesia Type:General  Level of Consciousness: sedated  Airway & Oxygen Therapy: Patient Spontanous Breathing and Patient connected to face mask oxygen  Post-op Assessment: Report given to RN and Post -op Vital signs reviewed and stable  Post vital signs: Reviewed and stable  Last Vitals:  Vitals Value Taken Time  BP 168/97 10/06/2017  8:56 AM  Temp    Pulse 66 10/06/2017  9:00 AM  Resp 17 10/06/2017  9:00 AM  SpO2 97 % 10/06/2017  9:00 AM  Vitals shown include unvalidated device data.  Last Pain:  Vitals:   10/06/17 0626  TempSrc:   PainSc: 2       Patients Stated Pain Goal: 3 (33/83/29 1916)  Complications: No apparent anesthesia complications

## 2017-10-06 NOTE — Anesthesia Postprocedure Evaluation (Signed)
Anesthesia Post Note  Patient: Nandini Bogdanski  Procedure(s) Performed: LAPAROSCOPIC CHOLECYSTECTOMY WITH INTRAOPERATIVE CHOLANGIOGRAM (N/A Abdomen)     Patient location during evaluation: PACU Anesthesia Type: General Level of consciousness: awake and alert Pain management: pain level controlled Vital Signs Assessment: post-procedure vital signs reviewed and stable Respiratory status: spontaneous breathing, nonlabored ventilation, respiratory function stable and patient connected to nasal cannula oxygen Cardiovascular status: blood pressure returned to baseline and stable Postop Assessment: no apparent nausea or vomiting Anesthetic complications: no    Last Vitals:  Vitals:   10/06/17 1248 10/06/17 1339  BP: 117/66 (!) 141/77  Pulse: (!) 57 (!) 57  Resp: 18 18  Temp: 36.5 C 36.9 C  SpO2: 96% 94%    Last Pain:  Vitals:   10/06/17 1339  TempSrc: Oral  PainSc:                  Ryan P Ellender

## 2017-10-06 NOTE — Op Note (Signed)
Procedure Note  Pre-operative Diagnosis:  Chronic cholecystitis, cholelithiasis, common bile duct dilatation  Post-operative Diagnosis:  same  Surgeon:  Armandina Gemma, MD  Assistant:  none   Procedure:  Laparoscopic cholecystectomy with intra-operative cholangiography  Anesthesia:  General  Estimated Blood Loss:  minimal  Drains: none         Specimen: Gallbladder to pathology  Indications:  The patient is referred by Dr. Reginia Forts for surgical evaluation and management of cholelithiasis. Patient has a known history of gallstones. Her most recent ultrasound examination was from Aug 02, 2017. This shows multiple gallstones. Common bile duct was mildly dilated at 8.9 mm. There were no signs of choledocholithiasis. Patient has had no prior history of hepatobiliary or pancreatic disease. She denies hepatitis or pancreatitis. She denies jaundice or acholic stools. There is a strong family history of gallbladder disease and the patient's mother and father. Patient has had 3 prior abdominal surgeries for pelvic and urologic reconstruction due to incontinence. She has also had a bilateral tubal ligation. She presents today to discuss cholecystectomy.  Procedure Details:  The patient was seen in the pre-op holding area. The risks, benefits, complications, treatment options, and expected outcomes were previously discussed with the patient. The patient agreed with the proposed plan and has signed the informed consent form.  The patient was brought to the Operating Room, identified as Kaylee Weiss and the procedure verified as laparoscopic cholecystectomy with intraoperative cholangiography. A "time out" was completed and the above information confirmed.  The patient was placed in the supine position. Following induction of general anesthesia, the abdomen was prepped and draped in the usual aseptic fashion.  An incision was made in the skin near the umbilicus. The midline fascia was  incised and the peritoneal cavity was entered and a Hasson canula was introduced under direct vision.  The Hasson canula was secured with a 0-Vicryl pursestring suture. Pneumoperitoneum was established with carbon dioxide. Additional trocars were introduced under direct vision along the right costal margin in the midline, mid-clavicular line, and anterior axillary line.   The gallbladder was identified and the fundus grasped and retracted cephalad. Adhesions were taken down bluntly and the electrocautery was utilized as needed, taking care not to injure any adjacent structures. The infundibulum was grasped and retracted laterally, exposing the peritoneum overlying the triangle of Calot. The peritoneum was incised and structures exposed with blunt dissection. The cystic duct was clearly identified, bluntly dissected circumferentially, and clipped at the neck of the gallbladder.  An incision was made in the cystic duct and the cholangiogram catheter introduced. The catheter was secured using an ligaclip.  Real-time cholangiography was performed using C-arm fluoroscopy.  There was rapid filling of a normal caliber common bile duct.  There was reflux of contrast into the left and right hepatic ductal systems.  There was free flow distally into the duodenum without filling defect or obstruction.  The catheter was removed from the peritoneal cavity.  The cystic duct was then ligated with surgical clips and divided. The cystic artery was identified, dissected circumferentially, ligated with ligaclips, and divided.  The gallbladder was dissected away from the liver bed using the electrocautery for hemostasis. The gallbladder was completely removed from the liver and placed into an endocatch bag. The gallbladder was removed in the endocatch bag through the umbilical port site and submitted to pathology for review.  The right upper quadrant was irrigated and the gallbladder bed was inspected. Hemostasis was achieved  with the electrocautery.  Pneumoperitoneum was  released after viewing removal of the trocars with good hemostasis noted. The umbilical wound was irrigated and the fascia was then closed with the pursestring suture.  Local anesthetic was infiltrated at all port sites. The skin incisions were closed with 4-0 Monocril subcuticular sutures and steri-strips and dressings were applied.  Instrument, sponge, and needle counts were correct at the conclusion of the case.  The patient was awakened from anesthesia and brought to the recovery room in stable condition.  The patient tolerated the procedure well.   Armandina Gemma, MD [facility] Office: [phone]

## 2017-10-06 NOTE — Progress Notes (Signed)
Dr. Harlow Asa returned page; orders received to in & out cath and give 500cc NS bolus if small amount. Bladder scan indicated 0-63ml of urine in bladder. Noted scant amount of urine in bathroom. NS bolus given. Will continue to monitor.

## 2017-10-07 ENCOUNTER — Encounter (HOSPITAL_COMMUNITY): Payer: Self-pay | Admitting: Surgery

## 2017-10-07 MED ORDER — OXYCODONE HCL 5 MG PO TABS: 5.0000 mg | ORAL_TABLET | Freq: Four times a day (QID) | ORAL | 0 refills | Status: DC | PRN

## 2017-10-07 NOTE — Discharge Summary (Signed)
Physician Discharge Summary [facility]  Patient ID: Kaylee Weiss MRN: [id number] DOB/AGE: 1945/11/08 72 y.o.  Admit date: 10/06/2017 Discharge date: 10/07/2017  Admission Diagnoses:  Chronic cholecystitis, cholelithiasis, dilated biliary system   Discharge Diagnoses:  Principal Problem:   Cholelithiasis Active Problems:   Common bile duct dilatation   Cholelithiasis with chronic cholecystitis   Discharged Condition: good  Hospital Course: Patient was admitted for observation following gallbladder surgery.  Post op course was uncomplicated.  Pain was well controlled.  Tolerated diet.  Patient was prepared for discharge home on POD#1.  Consults: None  Treatments: surgery: lap chole with IOC  Discharge Exam: Blood pressure 112/67, pulse 62, temperature 98.1 F (36.7 C), temperature source Oral, resp. rate 16, height 5' 5.5" (1.664 m), weight 66 kg (145 lb 9 oz), SpO2 95 %. HEENT - clear Neck - soft Chest - clear bilaterally Cor - RRR Abd - dressings dry and intact; soft; non-tender; no mass RUQ  Disposition: Home  Discharge Instructions    Diet - low sodium heart healthy   Complete by:  As directed    Discharge instructions   Complete by:  As directed    [facility]  LAPAROSCOPIC SURGERY:  POST-OP INSTRUCTIONS  Always review your discharge instruction sheet given to you by the facility where your surgery was performed.  A prescription for pain medication may be given to you upon discharge.  Take your pain medication as prescribed.  If narcotic pain medicine is not needed, then you may take acetaminophen (Tylenol) or ibuprofen (Advil) as needed.  Take your usually prescribed medications unless otherwise directed.  If you need a refill on your pain medication, please contact your pharmacy.  They will contact our office to request authorization. Prescriptions will not be filled after 5 P.M. or on weekends.  You should follow a  light diet the first few days after arrival home, such as soup and crackers or toast.  Be sure to include plenty of fluids daily.  Most patients will experience some swelling and bruising in the area of the incisions.  Ice packs will help.  Swelling and bruising can take several days to resolve.   It is common to experience some constipation after surgery.  Increasing fluid intake and taking a stool softener (such as Colace) will usually help or prevent this problem from occurring.  A mild laxative (Milk of Magnesia or Miralax) should be taken according to package instructions if there has been no bowel movement after 48 hours.  You will have steri-strips and a gauze dressing over your incisions.  You may remove the gauze bandage on the second day after surgery, and you may shower at that time.  Leave your steri-strips (small skin tapes) in place directly over the incision.  These strips should remain on the skin for 5-7 days and then be removed.  You may get them wet in the shower and pat them dry.  Any sutures or staples will be removed at the office during your follow-up visit.  ACTIVITIES:  You may resume regular (light) daily activities beginning the next day - such as daily self-care, walking, climbing stairs - gradually increasing activities as tolerated.  You may have sexual intercourse when it is comfortable.  Refrain from any heavy lifting or straining until approved by your doctor.  You may drive when you are no longer taking prescription pain medication, you can comfortably wear a seatbelt, and you can safely maneuver your car  and apply brakes.  You should see your doctor in the office for a follow-up appointment approximately 2-3 weeks after your surgery.  Make sure that you call for this appointment within a day or two after you arrive home to insure a convenient appointment time.  WHEN TO CALL YOUR DOCTOR: Fever over 101.0 Inability to urinate Continued bleeding from  incision Increased pain, redness, or drainage from the incision Increasing abdominal pain  The clinic staff is available to answer your questions during regular business hours.  Please don't hesitate to call and ask to speak to one of the nurses for clinical concerns.  If you have a medical emergency, go to the nearest emergency room or call 911.  A surgeon from [facility] is always on call for the hospital.  Earnstine Regal, MD, [facility] Office: [place]:  [place] [phone]  Website: [email or web address]   Increase activity slowly   Complete by:  As directed    Remove dressing in 24 hours   Complete by:  As directed      Allergies as of 10/07/2017      Reactions   Demerol [meperidine] Anaphylaxis   Gabapentin Other (See Comments)   Fatigue and foggy headed   Codeine Nausea And Vomiting   Other Other (See Comments)   Suicidal ideation Alcohol (drinking kind); takes liver enzymes and elevates them   Percocet [oxycodone-acetaminophen] Nausea And Vomiting      Medication List    TAKE these medications   acetaminophen 500 MG tablet Commonly known as:  TYLENOL Take 1,000 mg by mouth at bedtime.   aspirin 81 MG tablet Take 81 mg by mouth at bedtime.   aspirin-acetaminophen-caffeine 250-250-65 MG tablet Commonly known as:  EXCEDRIN MIGRAINE Take 1 tablet by mouth daily as needed for headache.   atorvastatin 10 MG tablet Commonly known as:  LIPITOR take 1 tablet by mouth once daily What changed:    how much to take  how to take this  when to take this   buPROPion 150 MG 24 hr tablet Commonly known as:  WELLBUTRIN XL TAKE 1 TABLET BY MOUTH ONCE DAILY   celecoxib 200 MG capsule Commonly known as:  CELEBREX take 1 capsule by mouth twice a day   cephALEXin 500 MG capsule Commonly known as:  KEFLEX Take 1 capsule (500 mg total) by mouth 4 (four) times daily.   DULoxetine 30 MG  capsule Commonly known as:  CYMBALTA Take 1 capsule (30 mg total) by mouth daily.   Fish Oil 1200 MG Caps Take 1,200 mg by mouth daily.   fluticasone 50 MCG/ACT nasal spray Commonly known as:  FLONASE Place 2 sprays into both nostrils daily.   folic acid 1 MG tablet Commonly known as:  FOLVITE take 1 tablet by mouth once daily   losartan 25 MG tablet Commonly known as:  COZAAR TAKE 1 TABLET BY MOUTH ONCE DAILY   metoprolol 200 MG 24 hr tablet Commonly known as:  TOPROL-XL Take 1 tablet (200 mg total) by mouth daily. Take with or immediately following a meal. What changed:    when to take this  additional instructions   nitroGLYCERIN 0.4 MG SL tablet Commonly known as:  NITROSTAT Place 1 tablet (0.4 mg total) under the tongue every 5 (five) minutes as needed for chest pain.   oxyCODONE 5 MG immediate release tablet Commonly known as:  Oxy IR/ROXICODONE Take 1-2 tablets (5-10 mg total) by mouth every 6 (  six) hours as needed for moderate pain.   oxyCODONE-acetaminophen 5-325 MG tablet Commonly known as:  PERCOCET/ROXICET Take 1 tablet by mouth every 6 (six) hours as needed for severe pain. What changed:  when to take this   polyethylene glycol packet Commonly known as:  MIRALAX / GLYCOLAX Take 17 g by mouth at bedtime.   pregabalin 25 MG capsule Commonly known as:  LYRICA Take 25 mg by mouth at bedtime.   PRESERVISION AREDS 2 Caps Take 1 capsule by mouth 2 (two) times daily.   valACYclovir 500 MG tablet Commonly known as:  VALTREX Take 1 tablet (500 mg total) by mouth 2 (two) times daily. What changed:    when to take this  reasons to take this   zolpidem 5 MG tablet Commonly known as:  AMBIEN Take 1 tablet (5 mg total) by mouth at bedtime as needed for sleep. What changed:  when to take this      Follow-up Information    Armandina Gemma, MD. Schedule an appointment as soon as possible for a visit in 3 week(s).   Specialty:  General Surgery Contact  information: [address] [place] [address] [phone]           Earnstine Regal, MD, [facility] Office: [phone]   Signed: Earnstine Regal 10/07/2017, 8:37 AM

## 2017-10-07 NOTE — Progress Notes (Signed)
Discharge instructions reviewed with patient. All questions answered. Patient wheeled to vehicle with belongings by nurse tech. 

## 2017-10-14 ENCOUNTER — Other Ambulatory Visit: Payer: Self-pay

## 2017-10-14 ENCOUNTER — Other Ambulatory Visit: Payer: Self-pay | Admitting: Family Medicine

## 2017-10-14 ENCOUNTER — Ambulatory Visit (INDEPENDENT_AMBULATORY_CARE_PROVIDER_SITE_OTHER): Payer: Medicare Other | Admitting: Physician Assistant

## 2017-10-14 ENCOUNTER — Encounter: Payer: Self-pay | Admitting: Physician Assistant

## 2017-10-14 VITALS — BP 145/85 | HR 58 | Temp 98.1°F | Resp 16

## 2017-10-14 DIAGNOSIS — G834 Cauda equina syndrome: Secondary | ICD-10-CM

## 2017-10-14 DIAGNOSIS — R300 Dysuria: Secondary | ICD-10-CM

## 2017-10-14 DIAGNOSIS — N3001 Acute cystitis with hematuria: Secondary | ICD-10-CM

## 2017-10-14 LAB — POC MICROSCOPIC URINALYSIS (UMFC): Mucus: ABSENT

## 2017-10-14 LAB — POCT URINALYSIS DIP (MANUAL ENTRY)
Bilirubin, UA: NEGATIVE
Glucose, UA: NEGATIVE mg/dL
Leukocytes, UA: NEGATIVE
Nitrite, UA: NEGATIVE
Protein Ur, POC: NEGATIVE mg/dL
Spec Grav, UA: 1.025 (ref 1.010–1.025)
Urobilinogen, UA: 0.2 E.U./dL
pH, UA: 5.5 (ref 5.0–8.0)

## 2017-10-14 MED ORDER — CEPHALEXIN 500 MG PO CAPS: 500.0000 mg | ORAL_CAPSULE | Freq: Four times a day (QID) | ORAL | 0 refills | Status: AC

## 2017-10-14 NOTE — Patient Instructions (Addendum)
Start taking Keflex 567m four times/day for one week.  Come back and see me in 5-7 days if no improvement of symptoms.    Urinary Tract Infection, Adult A urinary tract infection (UTI) is an infection of any part of the urinary tract. The urinary tract includes the:  Kidneys.  Ureters.  Bladder.  Urethra.  These organs make, store, and get rid of pee (urine) in the body. Follow these instructions at home:  Take over-the-counter and prescription medicines only as told by your doctor.  If you were prescribed an antibiotic medicine, take it as told by your doctor. Do not stop taking the antibiotic even if you start to feel better.  Avoid the following drinks: ? Alcohol. ? Caffeine. ? Tea. ? Carbonated drinks.  Drink enough fluid to keep your pee clear or pale yellow.  Keep all follow-up visits as told by your doctor. This is important.  Make sure to: ? Empty your bladder often and completely. Do not to hold pee for long periods of time. ? Empty your bladder before and after sex. ? Wipe from front to back after a bowel movement if you are female. Use each tissue one time when you wipe. Contact a doctor if:  You have back pain.  You have a fever.  You feel sick to your stomach (nauseous).  You throw up (vomit).  Your symptoms do not get better after 3 days.  Your symptoms go away and then come back. Get help right away if:  You have very bad back pain.  You have very bad lower belly (abdominal) pain.  You are throwing up and cannot keep down any medicines or water. This information is not intended to replace advice given to you by your health care provider. Make sure you discuss any questions you have with your health care provider. Document Released: 08/10/2007 Document Revised: 07/30/2015 Document Reviewed: 01/12/2015 Elsevier Interactive Patient Education  2[employer]  Thank you for coming in today. I hope you feel we met your needs.  Feel free to  call PCP if you have any questions or further requests.  Please consider signing up for MyChart if you do not already have it, as this is a great way to communicate with me.  Best,  Whitney McVey, PA-C  IF you received an x-ray today, you will receive an invoice from [facility]. Please contact [facility] at [phone] questions or concerns regarding your invoice.   IF you received labwork today, you will receive an invoice from [facility] Please contact [facility] at [phone] questions or concerns regarding your invoice.   Our billing staff will not be able to assist you with questions regarding bills from these companies.  You will be contacted with the lab results as soon as they are available. The fastest way to get your results is to activate your My Chart account. Instructions are located on the last page of this paperwork. If you have not heard from [facility] the results in 2 weeks, please contact this office.

## 2017-10-14 NOTE — Progress Notes (Signed)
Kaylee Weiss  MRN: [id number] DOB: Jun 14, 1945  PCP: Wardell Honour, MD  Subjective:  Pt is a 72 year old female who presents to clinic for dysuria x 1 week. Pt has h/o cauda equina syndrome.  Surgery for cholelithiasis 8/2. Diarrhea since the surgery - this is improving. She has no saddle sensation and cannot feel when she needs to have bowel movement or urinate. She keeps herself clean several times day.  C/o foul smelling odor of her urine for the past 5 days. She has no urinary sensation.  +fever last night - she did not take her temp.  She has f/u appt with surgeon in 2 weeks.   Review of Systems  Constitutional: Positive for fever. Negative for chills, diaphoresis and fatigue.  Gastrointestinal: Positive for diarrhea. Negative for abdominal pain, nausea and vomiting.  Genitourinary: Negative for difficulty urinating, dysuria, enuresis, flank pain, frequency, hematuria and urgency.  Musculoskeletal: Negative for back pain.  Neurological: Negative for dizziness and light-headedness.    Patient Active Problem List   Diagnosis Date Noted  . Cholelithiasis with chronic cholecystitis 10/06/2017  . Pure hypercholesterolemia 10/11/2016  . Constipation 07/25/2016  . Spinal stenosis of lumbar region 12/16/2015  . Memory change 12/16/2015  . Cholelithiasis 06/05/2015  . Common bile duct dilatation 06/05/2015  . Angina pectoris ([place]) 04/23/2015  . Insomnia 03/18/2015  . Arteriosclerosis of coronary artery 03/05/2015  . Cauda equina syndrome ([place]) 10/30/2014  . Unstable gait 02/12/2014  . Urinary incontinence 02/12/2014  . Essential hypertension 02/12/2014  . Glaucoma suspect 07/04/2011  . Fibrous papule of nose 02/08/2011  . Basal cell papilloma 11/02/2010  . Actinic keratoses 09/27/2010  . Herpes 09/27/2010  . Lichen simplex 16/96/7893  . Back pain, chronic 08/26/2010  . Enthesopathy of hip 08/26/2010  . Cervical spondylosis without myelopathy 03/19/2010  . Fibromyalgia  03/19/2010  . Lumbar and sacral osteoarthritis 03/19/2010    Current Outpatient Medications on File Prior to Visit  Medication Sig Dispense Refill  . acetaminophen (TYLENOL) 500 MG tablet Take 1,000 mg by mouth at bedtime.    Marland Kitchen aspirin 81 MG tablet Take 81 mg by mouth at bedtime.     Marland Kitchen aspirin-acetaminophen-caffeine (EXCEDRIN MIGRAINE) 250-250-65 MG tablet Take 1 tablet by mouth daily as needed for headache.    Marland Kitchen atorvastatin (LIPITOR) 10 MG tablet take 1 tablet by mouth once daily (Patient taking differently: Take 10 mg by mouth once daily at night) 90 tablet 3  . buPROPion (WELLBUTRIN XL) 150 MG 24 hr tablet TAKE 1 TABLET BY MOUTH ONCE DAILY 90 tablet 0  . celecoxib (CELEBREX) 200 MG capsule take 1 capsule by mouth twice a day 180 capsule 3  . DULoxetine (CYMBALTA) 30 MG capsule Take 1 capsule (30 mg total) by mouth daily. 90 capsule 1  . fluticasone (FLONASE) 50 MCG/ACT nasal spray Place 2 sprays into both nostrils daily. 16 g 12  . folic acid (FOLVITE) 1 MG tablet take 1 tablet by mouth once daily 90 tablet 3  . losartan (COZAAR) 25 MG tablet TAKE 1 TABLET BY MOUTH ONCE DAILY 90 tablet 0  . metoprolol succinate (TOPROL-XL) 200 MG 24 hr tablet Take 1 tablet (200 mg total) by mouth daily. Take with or immediately following a meal. (Patient taking differently: Take 200 mg by mouth every evening. Take with or immediately following a meal. ) 90 tablet 3  . Multiple Vitamins-Minerals (PRESERVISION AREDS 2) CAPS Take 1 capsule by mouth 2 (two) times daily.    . nitroGLYCERIN (  NITROSTAT) 0.4 MG SL tablet Place 1 tablet (0.4 mg total) under the tongue every 5 (five) minutes as needed for chest pain. 50 tablet 3  . Omega-3 Fatty Acids (FISH OIL) 1200 MG CAPS Take 1,200 mg by mouth daily.    Marland Kitchen oxyCODONE (OXY IR/ROXICODONE) 5 MG immediate release tablet Take 1-2 tablets (5-10 mg total) by mouth every 6 (six) hours as needed for moderate pain. 15 tablet 0  . oxyCODONE-acetaminophen (PERCOCET/ROXICET)  5-325 MG tablet Take 1 tablet by mouth every 6 (six) hours as needed for severe pain. (Patient taking differently: Take 1 tablet by mouth at bedtime as needed for severe pain. ) 30 tablet 0  . polyethylene glycol (MIRALAX / GLYCOLAX) packet Take 17 g by mouth at bedtime.    . pregabalin (LYRICA) 25 MG capsule Take 25 mg by mouth at bedtime.     . valACYclovir (VALTREX) 500 MG tablet Take 1 tablet (500 mg total) by mouth 2 (two) times daily. (Patient taking differently: Take 500 mg by mouth 2 (two) times daily as needed (cold sores). ) 180 tablet 3  . zolpidem (AMBIEN) 5 MG tablet Take 1 tablet (5 mg total) by mouth at bedtime as needed for sleep. (Patient taking differently: Take 5 mg by mouth at bedtime. ) 30 tablet 5  . cephALEXin (KEFLEX) 500 MG capsule Take 1 capsule (500 mg total) by mouth 4 (four) times daily. (Patient not taking: Reported on 10/14/2017) 28 capsule 0   No current facility-administered medications on file prior to visit.     Allergies  Allergen Reactions  . Demerol [Meperidine] Anaphylaxis  . Gabapentin Other (See Comments)    Fatigue and foggy headed  . Codeine Nausea And Vomiting  . Other Other (See Comments)    Suicidal ideation Alcohol (drinking kind); takes liver enzymes and elevates them  . Percocet [Oxycodone-Acetaminophen] Nausea And Vomiting     Objective:  BP (!) 146/83   Pulse (!) 58   Temp 98.1 F (36.7 C) (Oral)   Resp 16   SpO2 96%   Physical Exam  Constitutional: She is oriented to person, place, and time. No distress.  Cardiovascular: Normal rate, regular rhythm and normal heart sounds.  Abdominal: Soft. There is no tenderness. There is no CVA tenderness.    Incisions healing well. No drainage, erythema, swelling.   Neurological: She is alert and oriented to person, place, and time.  Skin: Skin is warm and dry.  Psychiatric: Judgment normal.  Vitals reviewed.   Results for orders placed or performed in visit on 10/14/17  POCT  Microscopic Urinalysis ([facility])  Result Value Ref Range   WBC,UR,HPF,POC Moderate (A) None WBC/hpf   RBC,UR,HPF,POC Few (A) None RBC/hpf   Bacteria Many (A) None, Too numerous to count   Mucus Absent Absent   Epithelial Cells, UR Per Microscopy None None, Too numerous to count cells/hpf  POCT urinalysis dipstick  Result Value Ref Range   Color, UA yellow yellow   Clarity, UA cloudy (A) clear   Glucose, UA negative negative mg/dL   Bilirubin, UA negative negative   Ketones, POC UA trace (5) (A) negative mg/dL   Spec Grav, UA 1.025 1.010 - 1.025   Blood, UA trace-intact (A) negative   pH, UA 5.5 5.0 - 8.0   Protein Ur, POC negative negative mg/dL   Urobilinogen, UA 0.2 0.2 or 1.0 E.U./dL   Nitrite, UA Negative Negative   Leukocytes, UA Negative Negative    Assessment and Plan :  1. Acute  cystitis with hematuria 2. Dysuria 3. Cauda equina syndrome ([facility]) - Pt presents c/o foul smelling urine and diarrhea x 1 week. No red flags. H/o cauda equina syndrome and recent cholecystectomy 8 days ago. UA is equivocal for UTI however give pt's PMH, HPI and recent surgery plan to cover for E Coli with Keflex. Urine culture is pending. RTC in 5-7 days if no improvement of symptoms.  - cephALEXin (KEFLEX) 500 MG capsule; Take 1 capsule (500 mg total) by mouth 4 (four) times daily for 7 days.  Dispense: 28 capsule; Refill: 0 - POCT Microscopic Urinalysis ([facility]) - POCT urinalysis dipstick - Urine Culture  Mercer Pod, PA-C  [facility] 10/14/2017 12:11 PM  Please note: Portions of this report may have been transcribed using dragon voice recognition software. Every effort was made to ensure accuracy; however, inadvertent computerized transcription errors may be present.

## 2017-10-16 NOTE — Telephone Encounter (Signed)
Wellbutrin XL 150 mg refill Last Refill:07/19/17 # 90 Last OV: 10/14/17 PCP: [facility] Pharmacy:[id number]

## 2017-10-18 LAB — URINE CULTURE

## 2017-10-26 DIAGNOSIS — M5442 Lumbago with sciatica, left side: Secondary | ICD-10-CM | POA: Diagnosis not present

## 2017-10-26 DIAGNOSIS — K8010 Calculus of gallbladder with chronic cholecystitis without obstruction: Secondary | ICD-10-CM | POA: Diagnosis not present

## 2017-10-26 DIAGNOSIS — G4489 Other headache syndrome: Secondary | ICD-10-CM | POA: Diagnosis not present

## 2017-10-26 DIAGNOSIS — M797 Fibromyalgia: Secondary | ICD-10-CM | POA: Diagnosis not present

## 2017-10-26 DIAGNOSIS — G8929 Other chronic pain: Secondary | ICD-10-CM | POA: Diagnosis not present

## 2017-10-26 DIAGNOSIS — I10 Essential (primary) hypertension: Secondary | ICD-10-CM | POA: Diagnosis not present

## 2017-10-26 DIAGNOSIS — G4701 Insomnia due to medical condition: Secondary | ICD-10-CM | POA: Diagnosis not present

## 2017-10-26 DIAGNOSIS — E7800 Pure hypercholesterolemia, unspecified: Secondary | ICD-10-CM | POA: Diagnosis not present

## 2017-10-26 DIAGNOSIS — N390 Urinary tract infection, site not specified: Secondary | ICD-10-CM | POA: Diagnosis not present

## 2017-10-26 DIAGNOSIS — M5441 Lumbago with sciatica, right side: Secondary | ICD-10-CM | POA: Diagnosis not present

## 2017-10-26 DIAGNOSIS — M48061 Spinal stenosis, lumbar region without neurogenic claudication: Secondary | ICD-10-CM | POA: Diagnosis not present

## 2017-10-28 ENCOUNTER — Other Ambulatory Visit: Payer: Self-pay | Admitting: Family Medicine

## 2017-10-30 ENCOUNTER — Other Ambulatory Visit: Payer: Self-pay

## 2017-10-30 MED ORDER — DULOXETINE HCL 30 MG PO CPEP: 30.0000 mg | ORAL_CAPSULE | Freq: Every day | ORAL | 1 refills | Status: DC

## 2017-10-30 NOTE — Progress Notes (Signed)
duloxetine refill Last Refill:05/08/17 # 90 1 RF Last OV: 05/08/17 PCP: Dr Pamella Pert Pharmacy:[facility] [address]

## 2017-11-18 ENCOUNTER — Other Ambulatory Visit: Payer: Self-pay | Admitting: Family Medicine

## 2017-11-20 NOTE — Telephone Encounter (Signed)
Lyrica 25 mg  refill Last Refill:07/15/17 # ? Last OV: 10/14/17 PCP: former pt of [facility] [place] [facility] [phone]  Prescribed by historical provider.  Losartan 25 mg  refill Last Refill:09/04/17 # 90 Last OV: 10/14/17 PCP: former pt of [facility] [place]: Festus Barren [phone] To early to refill.

## 2017-11-23 ENCOUNTER — Other Ambulatory Visit: Payer: Self-pay | Admitting: Family Medicine

## 2017-11-23 NOTE — Telephone Encounter (Signed)
Refill of lyrica by historical provider  Refill of cozaar  LOV 10/14/17 [facility]. [facility]  LRF by Dr. Tamala Julian  09/04/17  #90  0 refills  [place]

## 2017-11-30 DIAGNOSIS — H40013 Open angle with borderline findings, low risk, bilateral: Secondary | ICD-10-CM | POA: Diagnosis not present

## 2017-11-30 DIAGNOSIS — H524 Presbyopia: Secondary | ICD-10-CM | POA: Diagnosis not present

## 2017-11-30 DIAGNOSIS — H353112 Nonexudative age-related macular degeneration, right eye, intermediate dry stage: Secondary | ICD-10-CM | POA: Diagnosis not present

## 2017-12-12 DIAGNOSIS — H43813 Vitreous degeneration, bilateral: Secondary | ICD-10-CM | POA: Diagnosis not present

## 2017-12-12 DIAGNOSIS — H35371 Puckering of macula, right eye: Secondary | ICD-10-CM | POA: Diagnosis not present

## 2017-12-12 DIAGNOSIS — H353132 Nonexudative age-related macular degeneration, bilateral, intermediate dry stage: Secondary | ICD-10-CM | POA: Diagnosis not present

## 2017-12-12 DIAGNOSIS — Q141 Congenital malformation of retina: Secondary | ICD-10-CM | POA: Diagnosis not present

## 2018-01-01 ENCOUNTER — Other Ambulatory Visit: Payer: Self-pay | Admitting: Family Medicine

## 2018-01-02 NOTE — Telephone Encounter (Signed)
Requested Prescriptions  Pending Prescriptions Disp Refills  . buPROPion (WELLBUTRIN XL) 150 MG 24 hr tablet [Pharmacy Med Name: BUPROPION XL 150MG  TABLETS (24 H)] 90 tablet 0    Sig: TAKE 1 TABLET BY MOUTH EVERY DAY     Psychiatry: Antidepressants - bupropion Failed - 01/01/2018  9:47 PM      Failed - Last BP in normal range    BP Readings from Last 1 Encounters:  10/14/17 (!) 145/85         Passed - Valid encounter within last 6 months    Recent Outpatient Visits          2 months ago Acute cystitis with hematuria   Primary Care at [facility], [place], PA-C   3 months ago Left hip pain   Primary Care at [facility], Renette Butters, MD   3 months ago Left hip pain   Primary Care at [facility], Renette Butters, MD   4 months ago Cauda equina syndrome [facility])   Primary Care at [facility], Renette Butters, MD   5 months ago Spinal stenosis of lumbar region with neurogenic claudication   Primary Care at [facility], Renette Butters, MD

## 2018-01-12 DIAGNOSIS — G4701 Insomnia due to medical condition: Secondary | ICD-10-CM | POA: Diagnosis not present

## 2018-01-12 DIAGNOSIS — M48061 Spinal stenosis, lumbar region without neurogenic claudication: Secondary | ICD-10-CM | POA: Diagnosis not present

## 2018-01-12 DIAGNOSIS — Z23 Encounter for immunization: Secondary | ICD-10-CM | POA: Diagnosis not present

## 2018-01-12 DIAGNOSIS — B009 Herpesviral infection, unspecified: Secondary | ICD-10-CM | POA: Diagnosis not present

## 2018-01-12 DIAGNOSIS — M797 Fibromyalgia: Secondary | ICD-10-CM | POA: Diagnosis not present

## 2018-01-12 DIAGNOSIS — R32 Unspecified urinary incontinence: Secondary | ICD-10-CM | POA: Diagnosis not present

## 2018-01-25 DIAGNOSIS — D2272 Melanocytic nevi of left lower limb, including hip: Secondary | ICD-10-CM | POA: Diagnosis not present

## 2018-01-25 DIAGNOSIS — M7130 Other bursal cyst, unspecified site: Secondary | ICD-10-CM | POA: Diagnosis not present

## 2018-01-25 DIAGNOSIS — D1801 Hemangioma of skin and subcutaneous tissue: Secondary | ICD-10-CM | POA: Diagnosis not present

## 2018-01-25 DIAGNOSIS — L814 Other melanin hyperpigmentation: Secondary | ICD-10-CM | POA: Diagnosis not present

## 2018-01-25 DIAGNOSIS — L821 Other seborrheic keratosis: Secondary | ICD-10-CM | POA: Diagnosis not present

## 2018-01-25 DIAGNOSIS — D485 Neoplasm of uncertain behavior of skin: Secondary | ICD-10-CM | POA: Diagnosis not present

## 2018-01-30 ENCOUNTER — Other Ambulatory Visit: Payer: Self-pay | Admitting: Family Medicine

## 2018-02-03 ENCOUNTER — Other Ambulatory Visit: Payer: Self-pay

## 2018-02-03 MED ORDER — ATORVASTATIN CALCIUM 10 MG PO TABS: ORAL_TABLET | ORAL | 0 refills | Status: DC

## 2018-03-04 ENCOUNTER — Other Ambulatory Visit: Payer: Self-pay | Admitting: Family Medicine

## 2018-03-26 ENCOUNTER — Other Ambulatory Visit: Payer: Self-pay | Admitting: Family Medicine

## 2018-03-26 DIAGNOSIS — Z1231 Encounter for screening mammogram for malignant neoplasm of breast: Secondary | ICD-10-CM

## 2018-04-27 ENCOUNTER — Ambulatory Visit: Payer: BLUE CROSS/BLUE SHIELD

## 2018-05-21 ENCOUNTER — Ambulatory Visit
Admission: RE | Admit: 2018-05-21 | Discharge: 2018-05-21 | Disposition: A | Discharge status: Home / Self Care | Payer: Medicare Other | Source: Ambulatory Visit | Attending: Family Medicine | Admitting: Family Medicine

## 2018-05-21 ENCOUNTER — Other Ambulatory Visit: Payer: Self-pay

## 2018-05-21 DIAGNOSIS — Z1231 Encounter for screening mammogram for malignant neoplasm of breast: Secondary | ICD-10-CM

## 2018-06-11 ENCOUNTER — Telehealth: Payer: Self-pay | Admitting: General Practice

## 2018-06-11 NOTE — Telephone Encounter (Signed)
Patient need to be seen in order to get a refill on this medication, Looks like patient sees Zannie Cove at [facility]

## 2018-06-11 NOTE — Telephone Encounter (Signed)
Copied from [place] [id number]. Topic: Quick Communication - Rx Refill/Question >> Jun 11, 2018  9:56 AM Celene Kras A wrote: Medication: fluticasone (FLONASE) 50 MCG/ACT nasal spray  Has the patient contacted their pharmacy? Yes.   (Agent: If no, request that the patient contact the pharmacy for the refill.) (Agent: If yes, when and what did the pharmacy advise?)  Preferred Pharmacy (with phone number or street name): [id number] - [place], [address] AT [address] [place] [address] Phone: [phone] Fax: [phone] Not a 24 hour pharmacy; exact hours not known.    Agent: Please be advised that RX refills may take up to 3 business days. We ask that you follow-up with your pharmacy.

## 2018-08-02 ENCOUNTER — Encounter: Payer: Self-pay | Admitting: Podiatry

## 2018-08-02 ENCOUNTER — Ambulatory Visit (INDEPENDENT_AMBULATORY_CARE_PROVIDER_SITE_OTHER): Payer: Medicare Other | Admitting: Podiatry

## 2018-08-02 ENCOUNTER — Other Ambulatory Visit: Payer: Self-pay

## 2018-08-02 VITALS — BP 134/77 | HR 77 | Temp 98.1°F

## 2018-08-02 DIAGNOSIS — L600 Ingrowing nail: Secondary | ICD-10-CM | POA: Diagnosis not present

## 2018-08-02 MED ORDER — NEOMYCIN-POLYMYXIN-HC 3.5-10000-1 OT SOLN: OTIC | 0 refills | Status: DC

## 2018-08-02 NOTE — Progress Notes (Signed)
She presents today chief complaint of a painful ingrown toenail to the lateral border fourth digit of the left foot.  She states is been like this for quite some time and she would like to consider having it removed cyst on the right foot as well as it did 3 years ago.  Objective: I have reviewed her past medical history medications allergies surgery social history review of systems all with no changes.  Pulses are palpable bilaterally.  Neurologic sensorium is intact deep tendon reflexes are intact muscle strength is normal symmetrical.  Orthopedic evaluation demonstrates all joints distal to the ankle full range of motion without crepitation.  She has mild adductovarus rotated hammertoe deformities fourth and fifth bilaterally with tenderness on palpation of the fourth nail border left with mild erythema no purulence no malodor.  Assessment: Ingrown nail lateral border fourth toe left foot.  Plan: Discussed etiology pathology conservative versus surgical therapies.  Performed chemical matricectomy to the lateral border after local anesthesia was administered.  She tolerated procedure well without complications was provided with both oral and written home-going instructions for the care and soaking of the toe.  She was also provided with a prescription for Cortisporin Otic to be applied twice daily after soaking.  She understands this is amenable to it we will follow-up with me in 2 weeks for reevaluation.

## 2018-08-02 NOTE — Patient Instructions (Signed)

## 2018-08-21 ENCOUNTER — Encounter: Payer: Self-pay | Admitting: Sports Medicine

## 2018-08-21 ENCOUNTER — Ambulatory Visit (INDEPENDENT_AMBULATORY_CARE_PROVIDER_SITE_OTHER): Payer: Self-pay

## 2018-08-21 ENCOUNTER — Other Ambulatory Visit: Payer: Self-pay

## 2018-08-21 DIAGNOSIS — L600 Ingrowing nail: Secondary | ICD-10-CM

## 2018-08-22 NOTE — Progress Notes (Signed)
Patient is here today for a follow up appointment. She recently had her 4th left ingrown toenail removed on 5.28.20.She states that the area is sore but she feels like it is healing well.   No redness, no erythema, no swelling, no drainage, no s/s of infections. It is scabbed over and healing well at this time.   Discussed s/s of infection, verbal and written instructions were given to the patient. Follow up as needed

## 2018-09-16 DIAGNOSIS — R151 Fecal smearing: Secondary | ICD-10-CM | POA: Insufficient documentation

## 2018-09-16 DIAGNOSIS — H3530 Unspecified macular degeneration: Secondary | ICD-10-CM | POA: Insufficient documentation

## 2018-09-16 DIAGNOSIS — N816 Rectocele: Secondary | ICD-10-CM | POA: Insufficient documentation

## 2018-09-18 ENCOUNTER — Other Ambulatory Visit: Payer: Self-pay | Admitting: Family Medicine

## 2018-09-18 DIAGNOSIS — E2839 Other primary ovarian failure: Secondary | ICD-10-CM

## 2018-11-19 ENCOUNTER — Encounter: Payer: Self-pay | Admitting: Cardiology

## 2018-11-19 ENCOUNTER — Other Ambulatory Visit: Payer: Self-pay

## 2018-11-19 ENCOUNTER — Ambulatory Visit (INDEPENDENT_AMBULATORY_CARE_PROVIDER_SITE_OTHER): Payer: Medicare Other | Admitting: Cardiology

## 2018-11-19 VITALS — BP 165/90 | HR 63 | Temp 95.1°F | Ht 65.0 in | Wt 148.2 lb

## 2018-11-19 DIAGNOSIS — I10 Essential (primary) hypertension: Secondary | ICD-10-CM | POA: Diagnosis not present

## 2018-11-19 DIAGNOSIS — I2510 Atherosclerotic heart disease of native coronary artery without angina pectoris: Secondary | ICD-10-CM
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
## 2018-11-20 ENCOUNTER — Other Ambulatory Visit: Payer: Self-pay

## 2018-11-20 DIAGNOSIS — I10 Essential (primary) hypertension: Secondary | ICD-10-CM

## 2018-11-20 MED ORDER — LOSARTAN POTASSIUM 50 MG PO TABS: 50.0000 mg | ORAL_TABLET | Freq: Every day | ORAL | 1 refills | Status: DC

## 2018-11-20 NOTE — Addendum Note (Signed)
Addended by: Nigel Mormon on: 11/20/2018 12:03 PM   Modules accepted: Orders

## 2018-12-04 ENCOUNTER — Telehealth: Payer: Self-pay

## 2018-12-04 NOTE — Telephone Encounter (Signed)
Sorry to hear she had profound BP drop with 50 mg losartan. Is BP-for the most part- staying between 110-140/70-90 mmHg with losartan 25 mg? If so, please continue losartan 25 mg. If lower than above numbers, please stop losartan. Will discuss more during the upcoming office visit.  Thanks MJP

## 2018-12-04 NOTE — Telephone Encounter (Signed)
No it has been as high as 0000000 systolic but she is in lots of pain as well ; she will keep a diary of her bp and let you know day of appt or if she needs to sooner; She said she isnt having any issues with the 25mg  QD

## 2018-12-04 NOTE — Telephone Encounter (Signed)
Telephone encounter:  Reason for call: Pt called she started her first new dose of Losartan 50mg  and she says that soon as she took it she felt like she was going to pass out; she took her BP it was 73/48; she laid down, had some fluids and eventually she felt better; No cp or sob involved with this; Ever since then she went back to the 25mg  once a day; Her BP is all over the place and wants your advice; She will cont 25mg  QD until she gets your advice   Usual provider: [facility]  Last office visit: 11/19/2018  Next office visit: 12/20/2018   Last hospitalization:    Current Outpatient Medications on File Prior to Visit  Medication Sig Dispense Refill  . acetaminophen (TYLENOL) 500 MG tablet Take 1,000 mg by mouth at bedtime.    Marland Kitchen aspirin 81 MG tablet Take 81 mg by mouth at bedtime.     Marland Kitchen atorvastatin (LIPITOR) 10 MG tablet TAKE 1 TABLET BY MOUTH EVERY DAY AT NIGHT.NEED APPOINTMENT FOR REFILLS 30 tablet 0  . buPROPion (WELLBUTRIN XL) 150 MG 24 hr tablet TAKE 1 TABLET BY MOUTH EVERY DAY 90 tablet 0  . fluticasone (FLONASE) 50 MCG/ACT nasal spray Place 2 sprays into both nostrils daily. 16 g 12  . folic acid (FOLVITE) 1 MG tablet take 1 tablet by mouth once daily 90 tablet 3  . losartan (COZAAR) 50 MG tablet Take 1 tablet (50 mg total) by mouth daily. 90 tablet 1  . meloxicam (MOBIC) 15 MG tablet Take 1 tablet by mouth daily.    . metoprolol succinate (TOPROL-XL) 200 MG 24 hr tablet Take 1 tablet (200 mg total) by mouth daily. Take with or immediately following a meal. (Patient taking differently: Take 200 mg by mouth every evening. Take with or immediately following a meal. ) 90 tablet 3  . mirabegron ER (MYRBETRIQ) 50 MG TB24 tablet Take 1 tablet by mouth daily.    . nitroGLYCERIN (NITROSTAT) 0.4 MG SL tablet Place 1 tablet (0.4 mg total) under the tongue every 5 (five) minutes as needed for chest pain. 50 tablet 3  . Omega-3 Fatty Acids (FISH OIL) 1200 MG CAPS Take 1,200 mg by mouth daily.    Marland Kitchen  oxyCODONE-acetaminophen (PERCOCET/ROXICET) 5-325 MG tablet Take 1 tablet by mouth every 6 (six) hours as needed for severe pain. (Patient taking differently: Take 1 tablet by mouth at bedtime as needed for severe pain. ) 30 tablet 0  . terbinafine (LAMISIL) 250 MG tablet Take 1 tablet by mouth daily.    Marland Kitchen tiZANidine (ZANAFLEX) 2 MG tablet Take 1 mg by mouth at bedtime as needed.    . valACYclovir (VALTREX) 500 MG tablet Take 1 tablet (500 mg total) by mouth 2 (two) times daily. (Patient taking differently: Take 500 mg by mouth 2 (two) times daily as needed (cold sores). ) 180 tablet 3  . zolpidem (AMBIEN) 5 MG tablet Take 1 tablet (5 mg total) by mouth at bedtime as needed for sleep. (Patient taking differently: Take 5 mg by mouth at bedtime. ) 30 tablet 5   No current facility-administered medications on file prior to visit.

## 2018-12-04 NOTE — Telephone Encounter (Signed)
Okay. Continue losartan 25 mg daily for now. Will discuss more during upcoming office visit.   Thanks MJP

## 2018-12-05 NOTE — Telephone Encounter (Signed)
Pt is aware and will keep a diary and bring to upcoming appt

## 2018-12-07 ENCOUNTER — Ambulatory Visit (INDEPENDENT_AMBULATORY_CARE_PROVIDER_SITE_OTHER): Payer: Medicare Other | Admitting: Cardiology

## 2018-12-07 ENCOUNTER — Encounter: Payer: Self-pay | Admitting: Cardiology

## 2018-12-07 ENCOUNTER — Telehealth: Payer: Self-pay

## 2018-12-07 ENCOUNTER — Other Ambulatory Visit: Payer: Self-pay

## 2018-12-07 VITALS — BP 145/82 | HR 54 | Temp 97.1°F | Ht 65.0 in | Wt 147.0 lb

## 2018-12-07 DIAGNOSIS — I10 Essential (primary) hypertension: Secondary | ICD-10-CM

## 2018-12-07 DIAGNOSIS — I2510 Atherosclerotic heart disease of native coronary artery without angina pectoris: Secondary | ICD-10-CM | POA: Diagnosis not present

## 2018-12-07 MED ORDER — HYDRALAZINE HCL 25 MG PO TABS: 25.0000 mg | ORAL_TABLET | Freq: Three times a day (TID) | ORAL | 2 refills | Status: DC | PRN

## 2018-12-07 NOTE — Telephone Encounter (Signed)
Ok to stop Losartan. I can see her today. Ask if okay with her.

## 2018-12-07 NOTE — Telephone Encounter (Signed)
Pt called again today stating that past 3 days her BP has been 154/98,139/90 in the morning then she takes Losartan 25 mg and starts to feel miserable and her BP drops to 97/66 84/57 89/59; Then by time afternoon hits it goes back up to low [id number] systolic and she feels better; She thinks that she needs to stop Losartan; Pt says she wants to change her upcoming appt to in person not VV
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
## 2018-12-07 NOTE — Telephone Encounter (Signed)
Ok I put her on today

## 2018-12-14 ENCOUNTER — Other Ambulatory Visit: Payer: Medicare Other

## 2018-12-14 ENCOUNTER — Other Ambulatory Visit: Payer: Self-pay

## 2018-12-14 ENCOUNTER — Ambulatory Visit (INDEPENDENT_AMBULATORY_CARE_PROVIDER_SITE_OTHER): Payer: Medicare Other

## 2018-12-14 DIAGNOSIS — I10 Essential (primary) hypertension: Secondary | ICD-10-CM | POA: Diagnosis not present
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
## 2018-12-19 ENCOUNTER — Other Ambulatory Visit (HOSPITAL_COMMUNITY): Payer: Self-pay | Admitting: Cardiology

## 2018-12-19 DIAGNOSIS — E782 Mixed hyperlipidemia: Secondary | ICD-10-CM

## 2018-12-19 DIAGNOSIS — I2510 Atherosclerotic heart disease of native coronary artery without angina pectoris: Secondary | ICD-10-CM

## 2018-12-20 ENCOUNTER — Other Ambulatory Visit: Payer: Self-pay

## 2018-12-20 ENCOUNTER — Telehealth (INDEPENDENT_AMBULATORY_CARE_PROVIDER_SITE_OTHER): Payer: Medicare Other | Admitting: Cardiology

## 2018-12-20 ENCOUNTER — Encounter: Payer: Self-pay | Admitting: Cardiology

## 2018-12-20 VITALS — BP 120/78 | HR 58 | Ht 65.0 in | Wt 147.0 lb

## 2018-12-20 DIAGNOSIS — I10 Essential (primary) hypertension: Secondary | ICD-10-CM

## 2018-12-20 DIAGNOSIS — I2510 Atherosclerotic heart disease of native coronary artery without angina pectoris: Secondary | ICD-10-CM | POA: Diagnosis not present

## 2018-12-20 LAB — LIPID PANEL
Chol/HDL Ratio: 3.6 ratio (ref 0.0–4.4)
Cholesterol, Total: 224 mg/dL — ABNORMAL HIGH (ref 100–199)
HDL: 62 mg/dL (ref >39)
LDL Chol Calc (NIH): 141 mg/dL — ABNORMAL HIGH (ref 0–99)
Triglycerides: 118 mg/dL (ref 0–149)
VLDL Cholesterol Cal: 21 mg/dL (ref 5–40)

## 2018-12-21 ENCOUNTER — Telehealth: Payer: Self-pay

## 2018-12-21 ENCOUNTER — Other Ambulatory Visit: Payer: Self-pay

## 2018-12-21 MED ORDER — METOPROLOL TARTRATE 50 MG PO TABS: 50.0000 mg | ORAL_TABLET | Freq: Two times a day (BID) | ORAL | 3 refills | Status: DC

## 2018-12-21 MED ORDER — METOPROLOL SUCCINATE ER 100 MG PO TB24: 100.0000 mg | ORAL_TABLET | Freq: Every day | ORAL | 3 refills | Status: DC

## 2018-12-21 MED ORDER — METOPROLOL TARTRATE 100 MG PO TABS: 100.0000 mg | ORAL_TABLET | Freq: Two times a day (BID) | ORAL | 3 refills | Status: DC

## 2018-12-21 MED ORDER — METOPROLOL SUCCINATE ER 50 MG PO TB24: 50.0000 mg | ORAL_TABLET | Freq: Every day | ORAL | 3 refills | Status: DC

## 2018-12-21 NOTE — Telephone Encounter (Signed)
Pt called to inform us that Dr. Einar Gip gave her Metoprolol 200 but to take 150 daily. Pt wanted a new Rx for 50 mg and 100mg .

## 2018-12-26 MED ORDER — ATORVASTATIN CALCIUM 40 MG PO TABS: 40.0000 mg | ORAL_TABLET | Freq: Every day | ORAL | 3 refills | Status: DC

## 2018-12-26 NOTE — Addendum Note (Signed)
Addended by: Nigel Mormon on: 12/26/2018 04:44 PM   Modules accepted: Orders

## 2018-12-26 NOTE — Progress Notes (Signed)
LMTCB

## 2018-12-27 NOTE — Addendum Note (Signed)
Addended by: Nigel Mormon on: 12/27/2018 05:13 PM   Modules accepted: Orders

## 2018-12-27 NOTE — Progress Notes (Signed)
Patient is questioning your recommendation of increasing Lipitor from 5mg  that she is currently taking, to 40mg . She is concerned because she is on Lamisil.

## 2018-12-27 NOTE — Progress Notes (Signed)
Discussed with the patient. You do not need to call.

## 2018-12-27 NOTE — Addendum Note (Signed)
Addended by: Nigel Mormon on: 12/27/2018 05:12 PM   Modules accepted: Orders

## 2018-12-28 NOTE — Telephone Encounter (Signed)
Please let her know that I reviewed her labs. Liver enzymes are normal. Alkaline phosphatase-which is part of liver function, is only minimally elevated at 117 (mormal <117). Will repeat this in 3 months.  Thanks MJP

## 2018-12-31 NOTE — Telephone Encounter (Signed)
Called pt to inform her about her lab results. Pt understood

## 2019-02-01 ENCOUNTER — Other Ambulatory Visit: Payer: Self-pay | Admitting: Cardiology

## 2019-02-01 DIAGNOSIS — I10 Essential (primary) hypertension: Secondary | ICD-10-CM

## 2019-03-18 ENCOUNTER — Telehealth: Payer: Self-pay | Admitting: Cardiology

## 2019-03-18 NOTE — Telephone Encounter (Signed)
Pt called about her lipidtor 63ml and explained that it makes her sick every night. She asked if the ml can be lowered.

## 2019-03-19 ENCOUNTER — Telehealth: Payer: Self-pay | Admitting: Cardiology

## 2019-03-19 NOTE — Telephone Encounter (Signed)
Pt called about her lipidtor 38ml and explained that it makes her sick every night. She asked if the ml can be lowered.

## 2019-03-19 NOTE — Telephone Encounter (Signed)
Please ask the patient to take 1/2 tab lipitor 40 mg to see if her side effects improve.  Thanks MJP

## 2019-03-19 NOTE — Telephone Encounter (Signed)
No

## 2019-03-21 ENCOUNTER — Other Ambulatory Visit: Payer: Self-pay

## 2019-03-21 DIAGNOSIS — I2510 Atherosclerotic heart disease of native coronary artery without angina pectoris: Secondary | ICD-10-CM

## 2019-03-21 MED ORDER — ATORVASTATIN CALCIUM 20 MG PO TABS: 20.0000 mg | ORAL_TABLET | Freq: Every day | ORAL | 3 refills | Status: DC

## 2019-03-25 ENCOUNTER — Other Ambulatory Visit: Payer: Self-pay | Admitting: Cardiology

## 2019-04-12 ENCOUNTER — Other Ambulatory Visit: Payer: Self-pay | Admitting: Family Medicine

## 2019-04-14 ENCOUNTER — Ambulatory Visit: Payer: Medicare Other | Attending: Internal Medicine

## 2019-04-14 DIAGNOSIS — Z23 Encounter for immunization: Secondary | ICD-10-CM

## 2019-04-14 NOTE — Progress Notes (Signed)
   Covid-19 Vaccination Clinic  Name:  Kaylee Weiss    MRN: [id number] DOB: [birth year]  04/14/2019  Kaylee Weiss was observed post Covid-19 immunization for 15 minutes without incidence. She was provided with Vaccine Information Sheet and instruction to access the V-Safe system.   Kaylee Weiss was instructed to call 911 with any severe reactions post vaccine: Marland Kitchen Difficulty breathing  . Swelling of your face and throat  . A fast heartbeat  . A bad rash all over your body  . Dizziness and weakness    Immunizations Administered    Name Date Dose VIS Date Route   Pfizer COVID-19 Vaccine 04/14/2019  2:28 PM 0.3 mL 02/15/2019 Intramuscular   Manufacturer: [place]   Lot: [id number]   [place]: [id number]

## 2019-04-30 ENCOUNTER — Ambulatory Visit: Payer: BLUE CROSS/BLUE SHIELD

## 2019-05-03 DIAGNOSIS — H04123 Dry eye syndrome of bilateral lacrimal glands: Secondary | ICD-10-CM | POA: Insufficient documentation

## 2019-05-03 DIAGNOSIS — M8589 Other specified disorders of bone density and structure, multiple sites: Secondary | ICD-10-CM | POA: Insufficient documentation

## 2019-05-09 ENCOUNTER — Ambulatory Visit: Payer: Medicare Other | Attending: Internal Medicine

## 2019-05-09 DIAGNOSIS — Z23 Encounter for immunization: Secondary | ICD-10-CM | POA: Insufficient documentation

## 2019-05-09 NOTE — Progress Notes (Signed)
   Covid-19 Vaccination Clinic  Name:  Kaylee Weiss    MRN: [id number] DOB: 1945-09-06  05/09/2019  [name] was observed post Covid-19 immunization for 15 minutes without incident. She was provided with Vaccine Information Sheet and instruction to access the V-Safe system.   [name] was instructed to call 911 with any severe reactions post vaccine: Marland Kitchen Difficulty breathing  . Swelling of face and throat  . A fast heartbeat  . A bad rash all over body  . Dizziness and weakness   Immunizations Administered    Name Date Dose VIS Date Route   Pfizer COVID-19 Vaccine 05/09/2019  8:39 AM 0.3 mL 02/15/2019 Intramuscular   Manufacturer: [place]   Lot: [id number]   [place]: [id number]

## 2019-06-21 ENCOUNTER — Ambulatory Visit: Payer: Medicare Other | Admitting: Cardiology

## 2019-06-25 ENCOUNTER — Other Ambulatory Visit: Payer: Self-pay | Admitting: Family Medicine

## 2019-06-25 DIAGNOSIS — J320 Chronic maxillary sinusitis: Secondary | ICD-10-CM

## 2019-06-26 ENCOUNTER — Other Ambulatory Visit: Payer: Self-pay | Admitting: Family Medicine

## 2019-06-26 DIAGNOSIS — Z1231 Encounter for screening mammogram for malignant neoplasm of breast: Secondary | ICD-10-CM

## 2019-07-11 ENCOUNTER — Ambulatory Visit
Admission: RE | Admit: 2019-07-11 | Discharge: 2019-07-11 | Disposition: A | Discharge status: Home / Self Care | Payer: Medicare Other | Source: Ambulatory Visit | Attending: Family Medicine | Admitting: Family Medicine

## 2019-07-11 DIAGNOSIS — J320 Chronic maxillary sinusitis: Secondary | ICD-10-CM

## 2019-08-23 ENCOUNTER — Ambulatory Visit: Payer: BLUE CROSS/BLUE SHIELD

## 2019-08-23 ENCOUNTER — Other Ambulatory Visit: Payer: BLUE CROSS/BLUE SHIELD

## 2019-09-23 ENCOUNTER — Other Ambulatory Visit: Payer: Self-pay

## 2019-09-23 ENCOUNTER — Ambulatory Visit
Admission: RE | Admit: 2019-09-23 | Discharge: 2019-09-23 | Disposition: A | Discharge status: Home / Self Care | Payer: Medicare Other | Source: Ambulatory Visit | Attending: Family Medicine | Admitting: Family Medicine

## 2019-09-23 DIAGNOSIS — Z1231 Encounter for screening mammogram for malignant neoplasm of breast: Secondary | ICD-10-CM

## 2019-11-04 ENCOUNTER — Other Ambulatory Visit: Payer: Self-pay | Admitting: Cardiology

## 2020-07-07 ENCOUNTER — Ambulatory Visit (INDEPENDENT_AMBULATORY_CARE_PROVIDER_SITE_OTHER): Payer: Medicare Other | Admitting: Podiatry

## 2020-07-07 ENCOUNTER — Other Ambulatory Visit: Payer: Self-pay

## 2020-07-07 ENCOUNTER — Encounter: Payer: Self-pay | Admitting: Podiatry

## 2020-07-07 DIAGNOSIS — M2170 Unequal limb length (acquired), unspecified site: Secondary | ICD-10-CM

## 2020-07-07 NOTE — Progress Notes (Signed)
She presents today for chief concern low back pain and sciatica and degenerative spine disease.  She states that this has resulted in leg length discrepancy and pain in the right lower extremity.  Objective: Vital signs are stable she is alert and oriented x3.  Pulses are palpable.  Evaluation of the leg length does demonstrate about 1/4 inch short on the right side with a right shoulder drop and pelvic tilt.  Assessment: Leg length discrepancy right leg shorter by about a quarter of an inch.  Plan: She was questioning whether or not she had should have orthotics at this point I felt that the best thing to do would be to give her heel inserts/heel raises to see if this will alleviate her symptoms.  If it does then we can have an orthotic built or she can continue to use these felt inserts.  I will follow-up with her in about a month to see how she is doing with this.

## 2020-08-20 ENCOUNTER — Other Ambulatory Visit: Payer: Self-pay

## 2020-08-20 ENCOUNTER — Encounter: Payer: Self-pay | Admitting: Podiatry

## 2020-08-20 ENCOUNTER — Ambulatory Visit (INDEPENDENT_AMBULATORY_CARE_PROVIDER_SITE_OTHER): Payer: Medicare Other | Admitting: Podiatry

## 2020-08-20 DIAGNOSIS — M2170 Unequal limb length (acquired), unspecified site: Secondary | ICD-10-CM

## 2020-08-22 NOTE — Progress Notes (Signed)
She presents today states that she is doing much better with her heel lifts.  We dispensed those at her last visit.  She is able to grocery shop without hip pain in her spine.has even been able to open up the joint now and get an injection and where he has not been able to in the past.  She is very very happy with the outcome with something so simple.  She feels that she will not be in a wheelchair anytime soon.  Objective: Vital signs are stable alert oriented x3 no change in physical exam.  Assessment leg length discrepancy.  Plan: We discussed the the need for continued use of these heel pads for the short leg.  She was asking about orthotics for her type of shoe I just do not think orthotics are going to do the trick because they will have to sit up so high in the shoe and I feel that they will be more problematic for her than a simple heel lift with these these pads that we can get her.  I will be happy to follow-up with her at any time I feel that she is much improved and will continue to improve as time goes on.

## 2020-08-25 ENCOUNTER — Ambulatory Visit: Payer: Medicare Other | Admitting: Podiatry

## 2020-09-10 ENCOUNTER — Other Ambulatory Visit: Payer: Self-pay

## 2020-09-10 ENCOUNTER — Ambulatory Visit (INDEPENDENT_AMBULATORY_CARE_PROVIDER_SITE_OTHER): Payer: Medicare Other | Admitting: Podiatrist

## 2020-09-10 ENCOUNTER — Encounter: Payer: Self-pay | Admitting: Podiatrist

## 2020-09-10 DIAGNOSIS — F419 Anxiety disorder, unspecified: Secondary | ICD-10-CM | POA: Insufficient documentation

## 2020-09-10 DIAGNOSIS — J45909 Unspecified asthma, uncomplicated: Secondary | ICD-10-CM | POA: Insufficient documentation

## 2020-09-10 DIAGNOSIS — G43909 Migraine, unspecified, not intractable, without status migrainosus: Secondary | ICD-10-CM | POA: Insufficient documentation

## 2020-09-10 DIAGNOSIS — E785 Hyperlipidemia, unspecified: Secondary | ICD-10-CM | POA: Insufficient documentation

## 2020-09-10 DIAGNOSIS — L909 Atrophic disorder of skin, unspecified: Secondary | ICD-10-CM | POA: Diagnosis not present

## 2020-09-10 DIAGNOSIS — L600 Ingrowing nail: Secondary | ICD-10-CM

## 2020-09-10 DIAGNOSIS — M1990 Unspecified osteoarthritis, unspecified site: Secondary | ICD-10-CM | POA: Insufficient documentation

## 2020-09-10 DIAGNOSIS — M2170 Unequal limb length (acquired), unspecified site: Secondary | ICD-10-CM

## 2020-09-10 MED ORDER — NEOMYCIN-POLYMYXIN-HC 3.5-10000-1 OT SOLN: OTIC | 1 refills | Status: DC

## 2020-09-10 NOTE — Progress Notes (Signed)
Chief Complaint  Patient presents with   Nail Problem    Left 3rd toenail lateral border ingrown.     HPI: Patient is 75 y.o. female who presents today for the concerns as listed above.  She states she is doing well with heel lifts- grateful for the relief and function they have provided her.  She also states she has back related issues and feels like there is a roll of toilet paper underneath her toes at times.  She denies any sharp shooting pains or tingling in the toes.  She also relates pain in the left third toenail lateral border.  She states that she tried to trim the corner out but it became ingrown.  She has had this problem before and would like to have the corn removed today.   Patient Active Problem List   Diagnosis Date Noted   Anxiety 09/10/2020   Arthritis 09/10/2020   Asthma 09/10/2020   Elevated lipids 09/10/2020   Migraine 09/10/2020   Dry eye syndrome of both lacrimal glands 05/03/2019   Osteopenia of multiple sites 05/03/2019   Coronary artery disease involving native coronary artery of native heart without angina pectoris 11/19/2018   Fecal smearing 09/16/2018   Macular degeneration 09/16/2018   Rectocele 09/16/2018   Cholelithiasis with chronic cholecystitis 10/06/2017   Primary osteoarthritis involving multiple joints 10/16/2016   Pure hypercholesterolemia 10/11/2016   Constipation 07/25/2016   Spinal stenosis of lumbar region 12/16/2015   Memory change 12/16/2015   Chronic midline low back pain 09/15/2015   Cholelithiasis 06/05/2015   Common bile duct dilatation 06/05/2015   Angina pectoris ([place]) 04/23/2015   Insomnia 03/18/2015   Arteriosclerosis of coronary artery 03/05/2015   Cauda equina syndrome ([place]) 10/30/2014   Unstable gait 02/12/2014   Urinary incontinence 02/12/2014   Essential hypertension 02/12/2014   Glaucoma suspect 07/04/2011   Fibrous papule of nose 02/08/2011   Sebaceous hyperplasia 02/08/2011   Basal cell papilloma 11/02/2010    Scar 11/02/2010   Actinic keratoses 09/27/2010   Herpes 32/44/0102   Lichen simplex 72/53/6644   Herpesviral infection 09/27/2010   Back pain, chronic 08/26/2010   Enthesopathy of hip 08/26/2010   Cervical spondylosis without myelopathy 03/19/2010   Fibromyalgia 03/19/2010   Lumbar and sacral osteoarthritis 03/19/2010   Mallet deformity of left little finger 10/27/2009    Current Outpatient Medications on File Prior to Visit  Medication Sig Dispense Refill   acetaminophen (TYLENOL) 500 MG tablet Take 1,000 mg by mouth at bedtime.     acetaminophen (TYLENOL) 500 MG tablet Take by mouth.     acyclovir ointment (ZOVIRAX) 5 % APPLY TOPICALLY AS DIRECTED     amLODipine (NORVASC) 5 MG tablet amlodipine 5 mg tablet     aspirin 81 MG EC tablet Take by mouth.     aspirin 81 MG tablet Take 81 mg by mouth at bedtime.      atorvastatin (LIPITOR) 10 MG tablet Take 1 tablet by mouth daily.     buPROPion (WELLBUTRIN XL) 150 MG 24 hr tablet TAKE 1 TABLET BY MOUTH EVERY DAY 90 tablet 0   buPROPion (WELLBUTRIN XL) 300 MG 24 hr tablet      Cholecalciferol 25 MCG (1000 UT) capsule Take by mouth.     cycloSPORINE (RESTASIS) 0.05 % ophthalmic emulsion 1 drop 2 (two) times daily.     doxycycline (VIBRAMYCIN) 100 MG capsule TAKE 1 CAPSULE BY MOUTH TWICE DAILY FOR 2 WEEKS     famotidine (PEPCID) 40 MG tablet TAKE 1  TABLET(40 MG) BY MOUTH EVERY NIGHT     fluticasone (FLONASE) 50 MCG/ACT nasal spray Place 2 sprays into both nostrils daily. 16 g 12   folic acid (FOLVITE) 1 MG tablet take 1 tablet by mouth once daily 90 tablet 3   gabapentin (NEURONTIN) 100 MG capsule TAKE 1 CAPSULE(100 MG) BY MOUTH TWICE DAILY     ibandronate (BONIVA) 150 MG tablet ibandronate 150 mg tablet  TAKE 1 TABLET BY MOUTH EVERY MONTH     losartan (COZAAR) 50 MG tablet losartan 50 mg tablet     meloxicam (MOBIC) 15 MG tablet Take 1 tablet by mouth daily.     meloxicam (MOBIC) 7.5 MG tablet Take 7.5 mg by mouth 2 (two) times daily.      metoprolol succinate (TOPROL-XL) 50 MG 24 hr tablet TAKE 1 TABLET BY MOUTH EVERY DAY WITH OR IMMEDIATELY FOLLOWING A MEAL 90 tablet 0   Multiple Vitamins-Minerals (PRESERVISION AREDS 2) CAPS      nitroGLYCERIN (NITROSTAT) 0.4 MG SL tablet Place 1 tablet (0.4 mg total) under the tongue every 5 (five) minutes as needed for chest pain. 50 tablet 3   nitroGLYCERIN (NITROSTAT) 0.4 MG SL tablet Place 1 tablet under the tongue every 5 (five) minutes as needed.     Omega-3 Fatty Acids (FISH OIL) 1200 MG CAPS Take 1,200 mg by mouth daily.     oxyCODONE-acetaminophen (PERCOCET/ROXICET) 5-325 MG tablet Take 1 tablet by mouth every 6 (six) hours as needed for severe pain. (Patient taking differently: Take 1 tablet by mouth at bedtime as needed for severe pain.) 30 tablet 0   predniSONE (STERAPRED UNI-PAK 21 TAB) 10 MG (21) TBPK tablet See admin instructions. follow package directions     pyridoxine (B-6) 100 MG tablet Take by mouth.     terbinafine (LAMISIL) 250 MG tablet Take 1 tablet by mouth daily.     tizanidine (ZANAFLEX) 2 MG capsule Take by mouth.     tiZANidine (ZANAFLEX) 2 MG tablet Take 1 mg by mouth at bedtime as needed.     tobramycin (TOBREX) 0.3 % ophthalmic solution      valACYclovir (VALTREX) 500 MG tablet Take 1 tablet (500 mg total) by mouth 2 (two) times daily. (Patient taking differently: Take 500 mg by mouth 2 (two) times daily as needed (cold sores).) 180 tablet 3   valACYclovir (VALTREX) 500 MG tablet Take 1 tablet by mouth 2 (two) times daily.     zolpidem (AMBIEN) 5 MG tablet Take 1 tablet (5 mg total) by mouth at bedtime as needed for sleep. (Patient taking differently: Take 10 mg by mouth at bedtime.) 30 tablet 5   atorvastatin (LIPITOR) 20 MG tablet Take 1 tablet (20 mg total) by mouth daily at 6 PM. 90 tablet 3   metoprolol succinate (TOPROL-XL) 100 MG 24 hr tablet Take 1 tablet (100 mg total) by mouth daily. Take with or immediately following a meal. 90 tablet 3   No current  facility-administered medications on file prior to visit.    Allergies  Allergen Reactions   Demerol [Meperidine] Anaphylaxis   Gabapentin Other (See Comments)    Fatigue and foggy headed   Codeine Nausea And Vomiting   Hydralazine Other (See Comments)   Other Other (See Comments)    Suicidal ideation Alcohol (drinking kind); takes liver enzymes and elevates them   Percocet [Oxycodone-Acetaminophen] Nausea And Vomiting    Review of Systems No fevers, chills, nausea, muscle aches, no difficulty breathing, no calf pain, no chest pain  or shortness of breath.   Physical Exam  GENERAL APPEARANCE: Alert, conversant. Appropriately groomed. No acute distress.   VASCULAR: Pedal pulses palpable DP and PT bilateral.  Capillary refill time is immediate to all digits,  Proximal to distal cooling it warm to warm.  Digital perfusion adequate.   NEUROLOGIC: sensation is intact to 5.07 monofilament at 5/5 sites bilateral.  Light touch is intact bilateral, vibratory sensation intact bilateral  MUSCULOSKELETAL: acceptable muscle strength, tone and stability bilateral.  No gross boney pedal deformities noted.  No pain, crepitus or limitation noted with foot and ankle range of motion bilateral.   DERMATOLOGIC: skin is warm, supple, and dry.  No open lesions noted.  No rash, no pre ulcerative lesions.  Fat pad appears to be shifting towards the lesser digits likely causing the toilet paper roll discomfort that she is experiencing..  No neuroma type click or palpation is identified with exam.  Left third toenail lateral border does appear to be growing into the lateral skin.  It is painful with pressure.  No redness, no swelling, no sign of infection at today's visit is noted.    Assessment   Ingrown toenail of left foot  Fat pad atrophy of foot  Lower limb length difference   Plan  Examination and recommendations discussed with the patient.  I discussed that her fat pad appears to be slowly  dissipating and moving upwards towards her toes which is likely causing the sensation she is feeling.  It does not appear to be neuropathy in my opinion.  I also recommended a permanent removal of the lateral nail border of the third toe left foot.  The patient agreed.  Her skin was prepped with alcohol and a local injection of lidocaine plain was infiltrated to anesthetize the toe. The toe was then prepped with Betadine and exsanguinated. The offending nail border lateral is removed and phenol applied.  It was cleansed well with alcohol. Antibiotic ointment and a dressing was then applied and the patient was given instructions for aftercare and soaking.  She will follow-up in a week for recheck of the ingrown toenail procedure and if any concerns arise prior to that visit she will call.

## 2020-09-10 NOTE — Patient Instructions (Signed)
Soak Instructions    THE DAY AFTER THE PROCEDURE  Place 1/4 cup of epsom salts in a quart of warm tap water.  Submerge your foot or feet with outer bandage intact for the initial soak; this will allow the bandage to become moist and wet for easy lift off.  Once you remove your bandage, continue to soak in the solution for 20 minutes.  This soak should be done twice a day.  Next, remove your foot or feet from solution, blot dry the affected area and cover.  Apply cortisporin drops to toe.    You may use a band aid large enough to cover the area or use gauze and tape.     IF YOUR SKIN BECOMES IRRITATED WHILE USING THESE INSTRUCTIONS, IT IS OKAY TO SWITCH TO  antibacterial soap pump soap (Dial)  and water to keep the toe clean instead of soaking in epsom salts.

## 2020-09-11 ENCOUNTER — Other Ambulatory Visit: Payer: Self-pay | Admitting: Family Medicine

## 2020-09-11 DIAGNOSIS — Z1231 Encounter for screening mammogram for malignant neoplasm of breast: Secondary | ICD-10-CM

## 2020-09-18 ENCOUNTER — Ambulatory Visit (INDEPENDENT_AMBULATORY_CARE_PROVIDER_SITE_OTHER): Payer: Medicare Other | Admitting: Podiatry

## 2020-09-18 ENCOUNTER — Encounter: Payer: Self-pay | Admitting: Podiatry

## 2020-09-18 ENCOUNTER — Other Ambulatory Visit: Payer: Self-pay

## 2020-09-18 DIAGNOSIS — G629 Polyneuropathy, unspecified: Secondary | ICD-10-CM | POA: Diagnosis not present

## 2020-09-18 DIAGNOSIS — L600 Ingrowing nail: Secondary | ICD-10-CM

## 2020-09-18 DIAGNOSIS — M21619 Bunion of unspecified foot: Secondary | ICD-10-CM

## 2020-09-18 NOTE — Progress Notes (Signed)
Subjective:   Patient ID: Kaylee Weiss, female   DOB: 75 y.o.   MRN: [id number]   HPI Patient presents states she 1 to get her nail checked on her left foot which was removed and she is also complaining of some tingling in both feet and irritation around her big toe left over right   ROS      Objective:  Physical Exam  Neurovascular status unchanged with nail site left that is healing well with no indications that there is ingrown component with no active drainage.  Keratotic lesion of the sides of the big toes with moderate structural hyperostosis rather first metatarsal and low-grade tingling on both feet with no sharp dull vibratory issues     Assessment:  Doing well from the ingrown with mild neuropathic-like symptomatology diffuse and also moderate structural bunion leading to deviation creating callus formation     Plan:  H&P reviewed all conditions advised on padding therapy soap therapy and wider open toed shoes.  If symptoms persist we will have to consider other treatments and discussed possible gabapentin treatment but patient does not interested in this time and I do think it is premature

## 2020-11-06 ENCOUNTER — Other Ambulatory Visit: Payer: Self-pay

## 2020-11-06 ENCOUNTER — Ambulatory Visit
Admission: RE | Admit: 2020-11-06 | Discharge: 2020-11-06 | Disposition: A | Discharge status: Home / Self Care | Payer: Medicare Other | Source: Ambulatory Visit | Attending: Family Medicine | Admitting: Family Medicine

## 2020-11-06 DIAGNOSIS — Z1231 Encounter for screening mammogram for malignant neoplasm of breast: Secondary | ICD-10-CM

## 2021-01-19 LAB — COLOGUARD: COLOGUARD: NEGATIVE

## 2021-03-29 ENCOUNTER — Other Ambulatory Visit (HOSPITAL_COMMUNITY): Payer: Self-pay

## 2021-03-29 MED ORDER — METOPROLOL SUCCINATE ER 100 MG PO TB24: 100.0000 mg | ORAL_TABLET | Freq: Every day | ORAL | 0 refills | Status: DC

## 2021-03-29 MED ORDER — MELOXICAM 7.5 MG PO TABS
7.5000 mg | ORAL_TABLET | Freq: Two times a day (BID) | ORAL | 0 refills | Status: DC | PRN
  Filled 2021-05-04: qty 180, 90d supply, fill #0

## 2021-03-29 MED ORDER — ATORVASTATIN CALCIUM 10 MG PO TABS
10.0000 mg | ORAL_TABLET | Freq: Every day | ORAL | 2 refills | Status: DC
  Filled 2021-05-07: qty 90, 90d supply, fill #0
  Filled 2021-08-04: qty 90, 90d supply, fill #1
  Filled 2021-10-29: qty 90, 90d supply, fill #2

## 2021-03-29 MED ORDER — CYCLOSPORINE 0.05 % OP EMUL
OPHTHALMIC | 0 refills | Status: DC
  Filled 2021-05-05: qty 16.5, 90d supply, fill #0

## 2021-03-29 MED ORDER — FOLIC ACID 1 MG PO TABS
1.0000 mg | ORAL_TABLET | Freq: Every day | ORAL | 4 refills | Status: DC
  Filled 2021-05-04: qty 90, 90d supply, fill #0
  Filled 2021-08-04: qty 90, 90d supply, fill #1
  Filled 2021-10-29: qty 90, 90d supply, fill #2

## 2021-03-29 MED ORDER — LOSARTAN POTASSIUM 25 MG PO TABS
25.0000 mg | ORAL_TABLET | Freq: Two times a day (BID) | ORAL | 3 refills | Status: DC
  Filled 2021-05-04: qty 180, 90d supply, fill #0

## 2021-03-29 MED ORDER — GABAPENTIN 100 MG PO CAPS
100.0000 mg | ORAL_CAPSULE | Freq: Two times a day (BID) | ORAL | 1 refills | Status: DC
  Filled 2021-05-04: qty 180, 90d supply, fill #0
  Filled 2021-08-10: qty 180, 90d supply, fill #1

## 2021-03-29 MED ORDER — BUPROPION HCL ER (XL) 300 MG PO TB24
300.0000 mg | ORAL_TABLET | Freq: Every day | ORAL | 1 refills | Status: DC
  Filled 2021-05-04: qty 90, 90d supply, fill #0

## 2021-03-29 MED ORDER — BUPROPION HCL ER (XL) 150 MG PO TB24
150.0000 mg | ORAL_TABLET | Freq: Every day | ORAL | 3 refills | Status: DC
  Filled 2021-05-04: qty 90, 90d supply, fill #0
  Filled 2021-08-16: qty 90, 90d supply, fill #1

## 2021-03-29 MED ORDER — TIZANIDINE HCL 2 MG PO TABS
2.0000 mg | ORAL_TABLET | Freq: Every evening | ORAL | 0 refills | Status: DC
  Filled 2021-05-04 – 2021-08-16 (×2): qty 90, 90d supply, fill #0

## 2021-03-29 MED ORDER — METOPROLOL SUCCINATE ER 50 MG PO TB24: 50.0000 mg | ORAL_TABLET | Freq: Two times a day (BID) | ORAL | 0 refills | Status: DC

## 2021-03-29 MED ORDER — ESTRADIOL 0.1 MG/GM VA CREA
TOPICAL_CREAM | VAGINAL | 2 refills | Status: DC
  Filled 2021-05-05: qty 42.5, 90d supply, fill #0
  Filled 2021-08-16: qty 42.5, 90d supply, fill #1
  Filled 2021-10-29: qty 42.5, 90d supply, fill #2

## 2021-04-01 ENCOUNTER — Other Ambulatory Visit: Payer: Self-pay

## 2021-04-01 ENCOUNTER — Emergency Department (HOSPITAL_COMMUNITY)
Admission: EM | Admit: 2021-04-01 | Discharge: 2021-04-01 | Disposition: A | Discharge status: Home / Self Care | Payer: Medicare Other | Attending: Emergency Medicine | Admitting: Emergency Medicine

## 2021-04-01 ENCOUNTER — Emergency Department (HOSPITAL_COMMUNITY): Payer: Medicare Other

## 2021-04-01 ENCOUNTER — Encounter (HOSPITAL_COMMUNITY): Payer: Self-pay

## 2021-04-01 ENCOUNTER — Emergency Department (HOSPITAL_COMMUNITY)
Admission: EM | Admit: 2021-04-01 | Discharge: 2021-04-01 | Disposition: A | Discharge status: Home / Self Care | Payer: Medicare Other | Source: Home / Self Care | Attending: Emergency Medicine | Admitting: Emergency Medicine

## 2021-04-01 ENCOUNTER — Encounter (HOSPITAL_COMMUNITY): Payer: Self-pay | Admitting: Oncology

## 2021-04-01 DIAGNOSIS — S43004D Unspecified dislocation of right shoulder joint, subsequent encounter: Secondary | ICD-10-CM | POA: Insufficient documentation

## 2021-04-01 DIAGNOSIS — S01511A Laceration without foreign body of lip, initial encounter: Secondary | ICD-10-CM | POA: Diagnosis not present

## 2021-04-01 DIAGNOSIS — I10 Essential (primary) hypertension: Secondary | ICD-10-CM | POA: Insufficient documentation

## 2021-04-01 DIAGNOSIS — S43014A Anterior dislocation of right humerus, initial encounter: Secondary | ICD-10-CM | POA: Diagnosis not present

## 2021-04-01 DIAGNOSIS — S0031XA Abrasion of nose, initial encounter: Secondary | ICD-10-CM | POA: Insufficient documentation

## 2021-04-01 DIAGNOSIS — S52125A Nondisplaced fracture of head of left radius, initial encounter for closed fracture: Secondary | ICD-10-CM | POA: Insufficient documentation

## 2021-04-01 DIAGNOSIS — S4991XA Unspecified injury of right shoulder and upper arm, initial encounter: Secondary | ICD-10-CM | POA: Diagnosis present

## 2021-04-01 DIAGNOSIS — R7309 Other abnormal glucose: Secondary | ICD-10-CM | POA: Insufficient documentation

## 2021-04-01 DIAGNOSIS — W19XXXA Unspecified fall, initial encounter: Secondary | ICD-10-CM

## 2021-04-01 DIAGNOSIS — Y9301 Activity, walking, marching and hiking: Secondary | ICD-10-CM | POA: Diagnosis not present

## 2021-04-01 DIAGNOSIS — S43034A Inferior dislocation of right humerus, initial encounter: Secondary | ICD-10-CM | POA: Diagnosis not present

## 2021-04-01 DIAGNOSIS — Z79899 Other long term (current) drug therapy: Secondary | ICD-10-CM | POA: Insufficient documentation

## 2021-04-01 DIAGNOSIS — Z7982 Long term (current) use of aspirin: Secondary | ICD-10-CM | POA: Insufficient documentation

## 2021-04-01 DIAGNOSIS — E041 Nontoxic single thyroid nodule: Secondary | ICD-10-CM | POA: Insufficient documentation

## 2021-04-01 DIAGNOSIS — S0990XA Unspecified injury of head, initial encounter: Secondary | ICD-10-CM | POA: Insufficient documentation

## 2021-04-01 DIAGNOSIS — J328 Other chronic sinusitis: Secondary | ICD-10-CM | POA: Diagnosis not present

## 2021-04-01 DIAGNOSIS — S43004A Unspecified dislocation of right shoulder joint, initial encounter: Secondary | ICD-10-CM

## 2021-04-01 DIAGNOSIS — S52611A Displaced fracture of right ulna styloid process, initial encounter for closed fracture: Secondary | ICD-10-CM | POA: Insufficient documentation

## 2021-04-01 DIAGNOSIS — R110 Nausea: Secondary | ICD-10-CM | POA: Insufficient documentation

## 2021-04-01 DIAGNOSIS — W01198A Fall on same level from slipping, tripping and stumbling with subsequent striking against other object, initial encounter: Secondary | ICD-10-CM | POA: Insufficient documentation

## 2021-04-01 LAB — BASIC METABOLIC PANEL
Anion gap: 7 (ref 5–15)
BUN: 19 mg/dL (ref 8–23)
CO2: 24 mmol/L (ref 22–32)
Calcium: 9 mg/dL (ref 8.9–10.3)
Chloride: 109 mmol/L (ref 98–111)
Creatinine, Ser: 0.85 mg/dL (ref 0.44–1.00)
GFR, Estimated: >60 mL/min (ref >60)
Glucose, Bld: 161 mg/dL — ABNORMAL HIGH (ref 70–99)
Potassium: 4.1 mmol/L (ref 3.5–5.1)
Sodium: 140 mmol/L (ref 135–145)

## 2021-04-01 LAB — CBC WITH DIFFERENTIAL/PLATELET
Abs Immature Granulocytes: 0.03 10*3/uL (ref 0.00–0.07)
Basophils Absolute: 0 10*3/uL (ref 0.0–0.1)
Basophils Relative: 0 %
Eosinophils Absolute: 0.2 10*3/uL (ref 0.0–0.5)
Eosinophils Relative: 3 %
HCT: 46 % (ref 36.0–46.0)
Hemoglobin: 14.7 g/dL (ref 12.0–15.0)
Immature Granulocytes: 0 %
Lymphocytes Relative: 26 %
Lymphs Abs: 2 10*3/uL (ref 0.7–4.0)
MCH: 31.3 pg (ref 26.0–34.0)
MCHC: 32 g/dL (ref 30.0–36.0)
MCV: 97.9 fL (ref 80.0–100.0)
Monocytes Absolute: 0.5 10*3/uL (ref 0.1–1.0)
Monocytes Relative: 7 %
Neutro Abs: 4.9 10*3/uL (ref 1.7–7.7)
Neutrophils Relative %: 64 %
Platelets: 229 10*3/uL (ref 150–400)
RBC: 4.7 MIL/uL (ref 3.87–5.11)
RDW: 13.5 % (ref 11.5–15.5)
WBC: 7.6 10*3/uL (ref 4.0–10.5)
nRBC: 0 % (ref 0.0–0.2)

## 2021-04-01 MED ORDER — PROPOFOL 10 MG/ML IV BOLUS
30.0000 mg | Freq: Once | INTRAVENOUS | Status: AC
  Administered 2021-04-01: 30 mg via INTRAVENOUS
  Filled 2021-04-01: qty 20

## 2021-04-01 MED ORDER — ONDANSETRON 4 MG PO TBDP
4.0000 mg | ORAL_TABLET | Freq: Once | ORAL | Status: AC
Start: 2021-04-01 — End: 2021-04-01
  Administered 2021-04-01: 4 mg via ORAL
  Filled 2021-04-01: qty 1

## 2021-04-01 MED ORDER — ONDANSETRON HCL 4 MG/2ML IJ SOLN
4.0000 mg | Freq: Once | INTRAMUSCULAR | Status: AC
Start: 2021-04-01 — End: 2021-04-01
  Administered 2021-04-01: 4 mg via INTRAVENOUS
  Filled 2021-04-01: qty 2

## 2021-04-01 MED ORDER — HYDROMORPHONE HCL 1 MG/ML IJ SOLN
0.5000 mg | Freq: Once | INTRAMUSCULAR | Status: AC
  Administered 2021-04-01: 0.5 mg via INTRAVENOUS
  Filled 2021-04-01: qty 1

## 2021-04-01 MED ORDER — ONDANSETRON 4 MG PO TBDP: 4.0000 mg | ORAL_TABLET | Freq: Three times a day (TID) | ORAL | 0 refills | Status: DC | PRN

## 2021-04-01 MED ORDER — ONDANSETRON HCL 4 MG/2ML IJ SOLN
4.0000 mg | Freq: Once | INTRAMUSCULAR | Status: AC
  Administered 2021-04-01: 4 mg via INTRAVENOUS
  Filled 2021-04-01: qty 2

## 2021-04-01 NOTE — ED Provider Notes (Addendum)
[facility] DEPT Provider Note   CSN: [id number] Arrival date & time: 04/01/21  1830     History  Chief Complaint  Patient presents with   Lytle Michaels    Kaylee Weiss is a 76 y.o. female.  Patient seen earlier today.  Patient was out walking her dog and she tripped and hit her face and right shoulder.  Patient was noted to have a right shoulder dislocation which was relocated during that visit.  Also known to have abrasions to her face and lips.  And complaint of pain to the chest and now with complaint of left elbow and left wrist.  Patient also with a complaint of feeling nauseated and having a headache.  During the visit earlier today patient had CT head neck and face without any acute findings.  Patient's right shoulder x-ray showed the dislocation.  And appropriate reduction.  Patient also had chest and ribs without any significant findings.  However patient now with a new complaint of left elbow and left wrist pain will require x-rays of those areas.  Patient does have her right arm in shoulder immobilizer.  Patient has been followed by [facility] in the past.      Home Medications Prior to Admission medications   Medication Sig Start Date End Date Taking? Authorizing Provider  ondansetron (ZOFRAN-ODT) 4 MG disintegrating tablet Take 1 tablet (4 mg total) by mouth every 8 (eight) hours as needed for nausea or vomiting. 04/01/21  Yes Fredia Sorrow, MD  acetaminophen (TYLENOL) 500 MG tablet Take 1,000 mg by mouth at bedtime.    [provider]  acetaminophen (TYLENOL) 500 MG tablet Take by mouth.    [provider]  acyclovir ointment (ZOVIRAX) 5 % APPLY TOPICALLY AS DIRECTED 07/29/20   [provider]  amLODipine (NORVASC) 5 MG tablet amlodipine 5 mg tablet 04/15/20   [provider]  aspirin 81 MG EC tablet Take by mouth.    [provider]  aspirin 81 MG tablet Take 81 mg by mouth at bedtime.     [provider]  atorvastatin (LIPITOR) 10 MG tablet Take 1 tablet by mouth daily. 02/21/20   [provider]  atorvastatin (LIPITOR) 10 MG tablet Take 1 tablet by mouth daily. 11/05/20     atorvastatin (LIPITOR) 20 MG tablet Take 1 tablet (20 mg total) by mouth daily at 6 PM. 03/21/19 06/19/19  Adrian Prows, MD  buPROPion (WELLBUTRIN XL) 150 MG 24 hr tablet TAKE 1 TABLET BY MOUTH EVERY DAY 01/02/18   Delia Chimes A, MD  buPROPion (WELLBUTRIN XL) 150 MG 24 hr tablet Take 1 tablet by mouth daily. 03/02/21     buPROPion (WELLBUTRIN XL) 300 MG 24 hr tablet  07/27/20   [provider]  buPROPion (WELLBUTRIN XL) 300 MG 24 hr tablet Take 1 tablet by mouth daily. 07/27/20     Cholecalciferol 25 MCG (1000 UT) capsule Take by mouth.    [provider]  cycloSPORINE (RESTASIS) 0.05 % ophthalmic emulsion 1 drop 2 (two) times daily. 08/27/20   [provider]  cycloSPORINE (RESTASIS) 0.05 % ophthalmic emulsion Place 1 drop into both eyes 2 times a day 08/27/20     doxycycline (VIBRAMYCIN) 100 MG capsule TAKE 1 CAPSULE BY MOUTH TWICE DAILY FOR 2 WEEKS 06/05/20   [provider]  estradiol (ESTRACE) 0.1 MG/GM vaginal cream Insert 0.5 grams vaginally 3 times a week 02/24/21     famotidine (PEPCID) 40 MG tablet TAKE 1  TABLET(40 MG) BY MOUTH EVERY NIGHT 02/12/20   [provider]  fluticasone (FLONASE) 50 MCG/ACT nasal spray Place 2 sprays into both nostrils daily. 05/08/17   Wardell Honour, MD  folic acid (FOLVITE) 1 MG tablet take 1 tablet by mouth once daily 04/18/17   Wardell Honour, MD  folic acid (FOLVITE) 1 MG tablet Take 1 tablet (1 mg total) by mouth daily. 01/08/21     gabapentin (NEURONTIN) 100 MG capsule TAKE 1 CAPSULE(100 MG) BY MOUTH TWICE DAILY 08/13/20   [provider]  gabapentin (NEURONTIN) 100 MG capsule Take 1 capsule by mouth 2 times daily. 08/13/20     ibandronate (BONIVA) 150 MG tablet ibandronate 150 mg tablet  TAKE 1 TABLET BY MOUTH EVERY  MONTH    [provider]  losartan (COZAAR) 25 MG tablet Take 1 tablet by mouth 2 times daily. 01/25/21     losartan (COZAAR) 50 MG tablet losartan 50 mg tablet 03/16/20   [provider]  meloxicam (MOBIC) 15 MG tablet Take 1 tablet by mouth daily. 10/11/18   [provider]  meloxicam (MOBIC) 7.5 MG tablet Take 7.5 mg by mouth 2 (two) times daily. 09/09/20   [provider]  meloxicam (MOBIC) 7.5 MG tablet Take 1 tablet by mouth 2 times daily as needed for pain 02/03/21     metoprolol succinate (TOPROL-XL) 100 MG 24 hr tablet Take 1 tablet (100 mg total) by mouth daily. Take with or immediately following a meal. 12/21/18 03/21/19  Adrian Prows, MD  metoprolol succinate (TOPROL-XL) 100 MG 24 hr tablet Take 1 tablet (100 mg total) by mouth daily. 04/15/20     metoprolol succinate (TOPROL-XL) 50 MG 24 hr tablet TAKE 1 TABLET BY MOUTH EVERY DAY WITH OR IMMEDIATELY FOLLOWING A MEAL 11/04/19   Patwardhan, Manish J, MD  metoprolol succinate (TOPROL-XL) 50 MG 24 hr tablet Take 1 tablet by mouth 2 times daily. 04/15/20     Multiple Vitamins-Minerals (PRESERVISION AREDS 2) CAPS  03/07/1988   [provider]  neomycin-polymyxin-hydrocortisone (CORTISPORIN) OTIC solution Apply to toe twice daily (after soaks) and cover. 09/10/20   Bronson Ing, DPM  nitroGLYCERIN (NITROSTAT) 0.4 MG SL tablet Place 1 tablet (0.4 mg total) under the tongue every 5 (five) minutes as needed for chest pain. 05/08/17   Wardell Honour, MD  nitroGLYCERIN (NITROSTAT) 0.4 MG SL tablet Place 1 tablet under the tongue every 5 (five) minutes as needed. 04/05/19   [provider]  Omega-3 Fatty Acids (FISH OIL) 1200 MG CAPS Take 1,200 mg by mouth daily.    [provider]  oxyCODONE-acetaminophen (PERCOCET/ROXICET) 5-325 MG tablet Take 1 tablet by mouth every 6 (six) hours as needed for severe pain. Patient taking differently: Take 1 tablet by mouth at bedtime as needed for severe pain.  12/17/16   Wardell Honour, MD  predniSONE (STERAPRED UNI-PAK 21 TAB) 10 MG (21) TBPK tablet See admin instructions. follow package directions 05/26/20   [provider]  pyridoxine (B-6) 100 MG tablet Take by mouth.    [provider]  terbinafine (LAMISIL) 250 MG tablet Take 1 tablet by mouth daily.    [provider]  tizanidine (ZANAFLEX) 2 MG capsule Take by mouth.    [provider]  tiZANidine (ZANAFLEX) 2 MG tablet Take 1 mg by mouth at bedtime as needed. 10/31/18   [provider]  tiZANidine (ZANAFLEX) 2 MG tablet Take 1 tablet by mouth every evening at bedtime as needed  for pain. Okay to take 1/2 tablet if 1 full tablet causes sedation. 03/24/21     tobramycin (TOBREX) 0.3 % ophthalmic solution  09/03/20   [provider]  valACYclovir (VALTREX) 500 MG tablet Take 1 tablet (500 mg total) by mouth 2 (two) times daily. Patient taking differently: Take 500 mg by mouth 2 (two) times daily as needed (cold sores). 03/22/16   Wardell Honour, MD  valACYclovir (VALTREX) 500 MG tablet Take 1 tablet by mouth 2 (two) times daily. 11/25/19   [provider]  zolpidem (AMBIEN) 5 MG tablet Take 1 tablet (5 mg total) by mouth at bedtime as needed for sleep. Patient taking differently: Take 10 mg by mouth at bedtime. 09/20/17   Wardell Honour, MD      Allergies    Demerol [meperidine], Gabapentin, Codeine, Hydralazine, Other, and Percocet [oxycodone-acetaminophen]    Review of Systems   Review of Systems  Constitutional:  Negative for chills and fever.  HENT:  Negative for ear pain and sore throat.   Eyes:  Negative for pain and visual disturbance.  Respiratory:  Negative for cough and shortness of breath.   Cardiovascular:  Negative for chest pain and palpitations.  Gastrointestinal:  Positive for nausea. Negative for abdominal pain and vomiting.  Genitourinary:  Negative for dysuria and hematuria.  Musculoskeletal:  Positive for joint  swelling. Negative for arthralgias and back pain.  Skin:  Positive for wound. Negative for color change and rash.  Neurological:  Positive for headaches. Negative for seizures and syncope.  All other systems reviewed and are negative.  Physical Exam Updated Vital Signs BP 116/65 (BP Location: Left Arm)    Pulse 71    Temp 97.6 F (36.4 C) (Oral)    Resp 18    Ht 1.638 m (5' 4.5")    Wt 61.9 kg    SpO2 94%    BMI 23.07 kg/m  Physical Exam Vitals and nursing note reviewed.  Constitutional:      General: She is not in acute distress.    Appearance: She is well-developed.  HENT:     Head: Normocephalic.     Comments: Patient with abrasions to the nose and some swelling to the lower lip.  No active bleeding. Eyes:     Conjunctiva/sclera: Conjunctivae normal.  Cardiovascular:     Rate and Rhythm: Normal rate and regular rhythm.     Heart sounds: No murmur heard. Pulmonary:     Effort: Pulmonary effort is normal. No respiratory distress.     Breath sounds: Normal breath sounds.  Abdominal:     Palpations: Abdomen is soft.     Tenderness: There is no abdominal tenderness.  Musculoskeletal:        General: Tenderness present. No swelling.     Cervical back: Neck supple. No rigidity.     Comments: Bruising and tenderness to the left wrist area.  Patient able to make a good fist with her right hand.  Sensations intact cap refills intact radial pulses 2+.  Patient also with some tenderness and bruising minimal swelling to the left elbow area.  Right upper extremity is in the shoulder immobilizer.  Patient with good cap refill there.  Lower extremities without any significant injury.  Skin:    General: Skin is warm and dry.     Capillary Refill: Capillary refill takes less than 2 seconds.  Neurological:     General: No focal deficit present.     Mental Status: She is alert  and oriented to person, place, and time.  Psychiatric:        Mood and Affect: Mood normal.    ED Results /  Procedures / Treatments   Labs (all labs ordered are listed, but only abnormal results are displayed) Labs Reviewed - No data to display  EKG None  Radiology DG Ribs Unilateral W/Chest Right  Result Date: 04/01/2021 CLINICAL DATA:  Right shoulder pain and deformity following a fall today. EXAM: RIGHT RIBS AND CHEST - 3+ VIEW COMPARISON:  Chest radiographs dated 12/03/2016. FINDINGS: Normal sized heart. Mildly tortuous aorta. Interval elevation of the right hemidiaphragm or increased size of a previously demonstrated right diaphragmatic eventration. Associated mild right basilar atelectasis. Otherwise, clear lungs. Right shoulder dislocation described on the shoulder radiographs report. No rib fracture or pneumothorax seen. Cholecystectomy clips. IMPRESSION: 1. Previously described right shoulder dislocation. 2. No rib fracture or pneumothorax seen. 3. 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 Electronically Signed   By: Claudie Revering M.D.   On: 04/01/2021 12:27   DG Shoulder Right  Result Date: 04/01/2021 CLINICAL DATA:  Status post reduction dislocation. EXAM: RIGHT SHOULDER - 2+ VIEW COMPARISON:  Same day. FINDINGS: There is been successful reduction of previously described right glenohumeral joint dislocation. No definite fracture is noted. IMPRESSION: Successful reduction of previously described right glenohumeral joint dislocation. Electronically Signed   By: Marijo Conception M.D.   On: 04/01/2021 13:56   DG Shoulder Right  Result Date: 04/01/2021 CLINICAL DATA:  Right shoulder pain and deformity following a fall. EXAM: RIGHT SHOULDER - 2+ VIEW COMPARISON:  None. FINDINGS: Inferior and slightly anterior dislocation of the humeral head relative to the glenoid. The superior aspect of the humeral head is impacted against the inferior aspect of the glenoid with a  probable small Hill-Sachs deformity. IMPRESSION: Inferior and slightly anterior right shoulder dislocation with a possible associated Hill-Sachs deformity. Electronically Signed   By: Claudie Revering M.D.   On: 04/01/2021 12:25   DG Elbow Complete Left  Result Date: 04/01/2021 CLINICAL DATA:  Fall. EXAM: LEFT ELBOW - COMPLETE 3+ VIEW COMPARISON:  None. FINDINGS: There is a nondisplaced radial head fracture with intra-articular extension. There is no dislocation. Joint spaces are maintained. Joint effusion is present. IMPRESSION: 1. Nondisplaced radial head fracture. 2. Joint effusion. Electronically Signed   By: Ronney Asters M.D.   On: 04/01/2021 21:02   DG Wrist Complete Left  Result Date: 04/01/2021 CLINICAL DATA:  Diffuse left wrist pain following a fall. EXAM: LEFT WRIST - COMPLETE 3+ VIEW COMPARISON:  Left hand dated 02/12/2014 FINDINGS: Stable fixation screws in the distal 5th metacarpal. Interval fracture of the ulnar styloid with mild radial displacement of the distal fragment. No other fractures are seen. IMPRESSION: Ulnar styloid fracture. Electronically Signed   By: Claudie Revering M.D.   On: 04/01/2021 19:17   CT Head Wo Contrast  Result Date: 04/01/2021 CLINICAL DATA:  Right shoulder deformity following a fall today. EXAM: CT HEAD WITHOUT CONTRAST CT MAXILLOFACIAL WITHOUT CONTRAST CT CERVICAL SPINE WITHOUT CONTRAST TECHNIQUE: Multidetector CT imaging of the head, cervical spine, and maxillofacial structures were performed using the standard protocol without intravenous contrast. Multiplanar CT image reconstructions of the cervical spine and maxillofacial structures were also generated. RADIATION DOSE REDUCTION: This exam was performed according to the departmental dose-optimization program which includes automated exposure control, adjustment of the mA and/or kV according to patient  size and/or use of iterative reconstruction technique. COMPARISON:  Head CT dated 03/09/2014.  Brain MRI dated  12/29/2015 FINDINGS: CT HEAD FINDINGS Brain: Normal appearing cerebral hemispheres and posterior fossa structures. Normal size and position of the ventricles. No intracranial hemorrhage, mass lesion or CT evidence of acute infarction. Vascular: No hyperdense vessel or unexpected calcification. Skull: Normal. Negative for fracture or focal lesion. Other: None. CT MAXILLOFACIAL FINDINGS Osseous: No fracture or mandibular dislocation. No destructive process. Orbits: Status post bilateral cataract extraction. Sinuses: Mucosal thickening and retained secretions in the right maxillary sinus with some calcific density material in the retained secretions. Completely opacified sphenoid sinus on the left. Soft tissues: Unremarkable. CT CERVICAL SPINE FINDINGS Alignment: Mild levoconvex cervicothoracic scoliosis. No subluxations. Skull base and vertebrae: No acute fracture. No primary bone lesion or focal pathologic process. Soft tissues and spinal canal: No prevertebral fluid or swelling. No visible canal hematoma. Disc levels:  Multilevel degenerative changes. Upper chest: Clear lung apices. Other: Multiple bilateral thyroid nodules with a normal sized thyroid gland. The largest nodule is in the left lobe, measuring 1.3 cm in maximum diameter. Minimal bilateral carotid artery calcifications. IMPRESSION: 1. No skull fracture or intracranial hemorrhage. 2. No maxillofacial fracture. 3. No cervical spine fracture or subluxation. 4. Chronic left sphenoid and right maxillary sinusitis. The secretions in the right maxillary sinus have some calcific density. This can be seen with fungal sinusitis. 5. Multilevel cervical spine degenerative changes. 6. Multinodular thyroid with a 1.2 cm nodule on the left. Not clinically significant; no follow-up imaging recommended (ref: J Am Coll Radiol. 2015 Feb;12(2): 143-50). Electronically Signed   By: Claudie Revering M.D.   On: 04/01/2021 12:51   CT Cervical Spine Wo Contrast  Result Date:  04/01/2021 CLINICAL DATA:  Right shoulder deformity following a fall today. EXAM: CT HEAD WITHOUT CONTRAST CT MAXILLOFACIAL WITHOUT CONTRAST CT CERVICAL SPINE WITHOUT CONTRAST TECHNIQUE: Multidetector CT imaging of the head, cervical spine, and maxillofacial structures were performed using the standard protocol without intravenous contrast. Multiplanar CT image reconstructions of the cervical spine and maxillofacial structures were also generated. RADIATION DOSE REDUCTION: This exam was performed according to the departmental dose-optimization program which includes automated exposure control, adjustment of the mA and/or kV according to patient size and/or use of iterative reconstruction technique. COMPARISON:  Head CT dated 03/09/2014.  Brain MRI dated 12/29/2015 FINDINGS: CT HEAD FINDINGS Brain: Normal appearing cerebral hemispheres and posterior fossa structures. Normal size and position of the ventricles. No intracranial hemorrhage, mass lesion or CT evidence of acute infarction. Vascular: No hyperdense vessel or unexpected calcification. Skull: Normal. Negative for fracture or focal lesion. Other: None. CT MAXILLOFACIAL FINDINGS Osseous: No fracture or mandibular dislocation. No destructive process. Orbits: Status post bilateral cataract extraction. Sinuses: Mucosal thickening and retained secretions in the right maxillary sinus with some calcific density material in the retained secretions. Completely opacified sphenoid sinus on the left. Soft tissues: Unremarkable. CT CERVICAL SPINE FINDINGS Alignment: Mild levoconvex cervicothoracic scoliosis. No subluxations. Skull base and vertebrae: No acute fracture. No primary bone lesion or focal pathologic process. Soft tissues and spinal canal: No prevertebral fluid or swelling. No visible canal hematoma. Disc levels:  Multilevel degenerative changes. Upper chest: Clear lung apices. Other: Multiple bilateral thyroid nodules with a normal sized thyroid gland. The  largest nodule is in the left lobe, measuring 1.3 cm in maximum diameter. Minimal bilateral carotid artery calcifications. IMPRESSION: 1. No skull fracture or intracranial hemorrhage. 2. No maxillofacial fracture. 3. No cervical spine fracture or subluxation.  4. Chronic left sphenoid and right maxillary sinusitis. The secretions in the right maxillary sinus have some calcific density. This can be seen with fungal sinusitis. 5. Multilevel cervical spine degenerative changes. 6. Multinodular thyroid with a 1.2 cm nodule on the left. Not clinically significant; no follow-up imaging recommended (ref: J Am Coll Radiol. 2015 Feb;12(2): 143-50). Electronically Signed   By: Claudie Revering M.D.   On: 04/01/2021 12:51   CT Maxillofacial Wo Contrast  Result Date: 04/01/2021 CLINICAL DATA:  Right shoulder deformity following a fall today. EXAM: CT HEAD WITHOUT CONTRAST CT MAXILLOFACIAL WITHOUT CONTRAST CT CERVICAL SPINE WITHOUT CONTRAST TECHNIQUE: Multidetector CT imaging of the head, cervical spine, and maxillofacial structures were performed using the standard protocol without intravenous contrast. Multiplanar CT image reconstructions of the cervical spine and maxillofacial structures were also generated. RADIATION DOSE REDUCTION: This exam was performed according to the departmental dose-optimization program which includes automated exposure control, adjustment of the mA and/or kV according to patient size and/or use of iterative reconstruction technique. COMPARISON:  Head CT dated 03/09/2014.  Brain MRI dated 12/29/2015 FINDINGS: CT HEAD FINDINGS Brain: Normal appearing cerebral hemispheres and posterior fossa structures. Normal size and position of the ventricles. No intracranial hemorrhage, mass lesion or CT evidence of acute infarction. Vascular: No hyperdense vessel or unexpected calcification. Skull: Normal. Negative for fracture or focal lesion. Other: None. CT MAXILLOFACIAL FINDINGS Osseous: No fracture or  mandibular dislocation. No destructive process. Orbits: Status post bilateral cataract extraction. Sinuses: Mucosal thickening and retained secretions in the right maxillary sinus with some calcific density material in the retained secretions. Completely opacified sphenoid sinus on the left. Soft tissues: Unremarkable. CT CERVICAL SPINE FINDINGS Alignment: Mild levoconvex cervicothoracic scoliosis. No subluxations. Skull base and vertebrae: No acute fracture. No primary bone lesion or focal pathologic process. Soft tissues and spinal canal: No prevertebral fluid or swelling. No visible canal hematoma. Disc levels:  Multilevel degenerative changes. Upper chest: Clear lung apices. Other: Multiple bilateral thyroid nodules with a normal sized thyroid gland. The largest nodule is in the left lobe, measuring 1.3 cm in maximum diameter. Minimal bilateral carotid artery calcifications. IMPRESSION: 1. No skull fracture or intracranial hemorrhage. 2. No maxillofacial fracture. 3. No cervical spine fracture or subluxation. 4. Chronic left sphenoid and right maxillary sinusitis. The secretions in the right maxillary sinus have some calcific density. This can be seen with fungal sinusitis. 5. Multilevel cervical spine degenerative changes. 6. Multinodular thyroid with a 1.2 cm nodule on the left. Not clinically significant; no follow-up imaging recommended (ref: J Am Coll Radiol. 2015 Feb;12(2): 143-50). Electronically Signed   By: Claudie Revering M.D.   On: 04/01/2021 12:51    Procedures Procedures    Medications Ordered in ED Medications  ondansetron (ZOFRAN-ODT) disintegrating tablet 4 mg (has no administration in time range)    ED Course/ Medical Decision Making/ A&P                           Medical Decision Making Amount and/or Complexity of Data Reviewed Radiology: ordered.  Risk Prescription drug management.   X-ray of the left wrist shows a ulnar styloid fracture.  But patient also with a complaint  of left elbow pain.  We will get x-rays of that.  If no injury to the left elbow we will treat with a sugar-tong splint to the left arm.  Patient prefers to follow-up with [facility].  Will give referral to that.  Patient earlier had  x-rays of the chest shoulder showing appropriate reduction also had CT head neck and face without any acute findings.  Patient had head CT earlier without any acute findings.  Her complaints of headache and some nausea probably secondary to concussion.  We will treat with antinausea medicine and will review the need for pain medicine.  Patient can also follow-up with [facility] I[facility] regarding the right shoulder dislocation that was taken care of earlier.  X-rays done this evening show a distal slightly displaced ulnar styloid fracture.  Of the right wrist area.  And also x-rays of the elbow show a nondisplaced radial head fracture with some joint effusion.  The radial head fracture could be treated with a sling but patient already has the other arm in a sling I will go ahead and place her in a sugar-tong type splint.  Have her follow-up with [facility] orthopedics.  Patient will also be given Zofran for what is probable concussion.  Patient's head CT earlier stated was negative.  Splint was placed by the Ortho tech.  I checked it afterwards good cap refill to the fingers.  Does not feel too tight.  Patient okay for discharge and follow-up with orthopedics.   Final Clinical Impression(s) / ED Diagnoses Final diagnoses:  Fall, initial encounter  Injury of head, initial encounter  Dislocation of right shoulder joint, subsequent encounter  Closed nondisplaced fracture of head of left radius, initial encounter  Closed displaced fracture of styloid process of right ulna, initial encounter    Rx / DC Orders ED Discharge Orders          Ordered    ondansetron (ZOFRAN-ODT) 4 MG disintegrating tablet  Every 8 hours PRN        04/01/21 2131               Fredia Sorrow, MD 04/01/21 2133    Fredia Sorrow, MD 04/01/21 2148

## 2021-04-01 NOTE — ED Triage Notes (Signed)
Patient was seen earlier today for a fall. Patient reports that no one looked at her left wrist and is having pain. Patient states she fell on hr chin on concrete. Patient c/o dizziness, headache, nausea, and states she vomited x 1. Patient denies LOC or taking blood thinners.

## 2021-04-01 NOTE — ED Triage Notes (Signed)
Pt bib [facility] from home s/p mechanical fall while walking her dog.  Pt has deformity to right shoulder.  Pt rating pain 10/10.  Given 250 ml's NS, 4 mg zofran and 100 mcg of fentanyl prior to arrival.  Denies LOC or use of anticoagulants.

## 2021-04-01 NOTE — ED Notes (Signed)
Conscious Sedation  1325: Time Out Preformed.  Dr. Roderic Palau, Jenny Reichmann, OT, Sharrie Rothman, RN, [place] present. 1326: 30 mg Propofol given 1326: Achieved sedation(Respirations even and unlabored) 1327: Reduction of right shoulder achieved(Respirations even and unlabored) 1327:O2 sats dropped into the 80's on RA.  2L supplemental O2 applied.(Respirations even and unlabored) 1328:O2 saturation improved to 93%(Respirations even and unlabored) 1329: Pt A&O x 3.  1335: VS WNL pt awake and alert denying pain to shoulder at this time.

## 2021-04-01 NOTE — Discharge Instructions (Signed)
Follow-up with Dr. Judee Clara or one of his partners next week.

## 2021-04-01 NOTE — ED Provider Notes (Signed)
[facility] DEPT Provider Note   CSN: [id number] Arrival date & time: 04/01/21  1014     History  Chief Complaint  Patient presents with   Shoulder Injury    Kaylee Weiss is a 76 y.o. female.  Patient was walking her dog and she tripped and hit her face and right shoulder.  Patient has deformity to the right shoulder and nonsuturable laceration to the lower lip, patient has history of hypertension  The history is provided by the patient and medical records. No language interpreter was used.  Shoulder Injury This is a new problem. The current episode started less than 1 hour ago. The problem occurs constantly. The problem has not changed since onset.Pertinent negatives include no chest pain, no abdominal pain and no headaches. Exacerbated by: movement. Nothing relieves the symptoms. She has tried nothing for the symptoms. The treatment provided no relief.      Home Medications Prior to Admission medications   Medication Sig Start Date End Date Taking? Authorizing Provider  acetaminophen (TYLENOL) 500 MG tablet Take 1,000 mg by mouth at bedtime.    [provider]  acetaminophen (TYLENOL) 500 MG tablet Take by mouth.    [provider]  acyclovir ointment (ZOVIRAX) 5 % APPLY TOPICALLY AS DIRECTED 07/29/20   [provider]  amLODipine (NORVASC) 5 MG tablet amlodipine 5 mg tablet 04/15/20   [provider]  aspirin 81 MG EC tablet Take by mouth.    [provider]  aspirin 81 MG tablet Take 81 mg by mouth at bedtime.     [provider]  atorvastatin (LIPITOR) 10 MG tablet Take 1 tablet by mouth daily. 02/21/20   [provider]  atorvastatin (LIPITOR) 10 MG tablet Take 1 tablet by mouth daily. 11/05/20     atorvastatin (LIPITOR) 20 MG tablet Take 1 tablet (20 mg total) by mouth daily at 6 PM. 03/21/19 06/19/19  Adrian Prows, MD  buPROPion (WELLBUTRIN XL) 150 MG 24 hr tablet TAKE 1 TABLET BY MOUTH  EVERY DAY 01/02/18   Delia Chimes A, MD  buPROPion (WELLBUTRIN XL) 150 MG 24 hr tablet Take 1 tablet by mouth daily. 03/02/21     buPROPion (WELLBUTRIN XL) 300 MG 24 hr tablet  07/27/20   [provider]  buPROPion (WELLBUTRIN XL) 300 MG 24 hr tablet Take 1 tablet by mouth daily. 07/27/20     Cholecalciferol 25 MCG (1000 UT) capsule Take by mouth.    [provider]  cycloSPORINE (RESTASIS) 0.05 % ophthalmic emulsion 1 drop 2 (two) times daily. 08/27/20   [provider]  cycloSPORINE (RESTASIS) 0.05 % ophthalmic emulsion Place 1 drop into both eyes 2 times a day 08/27/20     doxycycline (VIBRAMYCIN) 100 MG capsule TAKE 1 CAPSULE BY MOUTH TWICE DAILY FOR 2 WEEKS 06/05/20   [provider]  estradiol (ESTRACE) 0.1 MG/GM vaginal cream Insert 0.5 grams vaginally 3 times a week 02/24/21     famotidine (PEPCID) 40 MG tablet TAKE 1 TABLET(40 MG) BY MOUTH EVERY NIGHT 02/12/20   [provider]  fluticasone (FLONASE) 50 MCG/ACT nasal spray Place 2 sprays into both nostrils daily. 05/08/17   Wardell Honour, MD  folic acid (FOLVITE) 1 MG tablet take 1 tablet by mouth once daily 04/18/17   Wardell Honour, MD  folic acid (FOLVITE) 1 MG tablet Take 1 tablet (1 mg total) by mouth daily. 01/08/21     gabapentin (NEURONTIN) 100 MG capsule TAKE 1  CAPSULE(100 MG) BY MOUTH TWICE DAILY 08/13/20   [provider]  gabapentin (NEURONTIN) 100 MG capsule Take 1 capsule by mouth 2 times daily. 08/13/20     ibandronate (BONIVA) 150 MG tablet ibandronate 150 mg tablet  TAKE 1 TABLET BY MOUTH EVERY MONTH    [provider]  losartan (COZAAR) 25 MG tablet Take 1 tablet by mouth 2 times daily. 01/25/21     losartan (COZAAR) 50 MG tablet losartan 50 mg tablet 03/16/20   [provider]  meloxicam (MOBIC) 15 MG tablet Take 1 tablet by mouth daily. 10/11/18   [provider]  meloxicam (MOBIC) 7.5 MG tablet Take 7.5 mg by mouth 2 (two) times daily. 09/09/20    [provider]  meloxicam (MOBIC) 7.5 MG tablet Take 1 tablet by mouth 2 times daily as needed for pain 02/03/21     metoprolol succinate (TOPROL-XL) 100 MG 24 hr tablet Take 1 tablet (100 mg total) by mouth daily. Take with or immediately following a meal. 12/21/18 03/21/19  Adrian Prows, MD  metoprolol succinate (TOPROL-XL) 100 MG 24 hr tablet Take 1 tablet (100 mg total) by mouth daily. 04/15/20     metoprolol succinate (TOPROL-XL) 50 MG 24 hr tablet TAKE 1 TABLET BY MOUTH EVERY DAY WITH OR IMMEDIATELY FOLLOWING A MEAL 11/04/19   Patwardhan, Manish J, MD  metoprolol succinate (TOPROL-XL) 50 MG 24 hr tablet Take 1 tablet by mouth 2 times daily. 04/15/20     Multiple Vitamins-Minerals (PRESERVISION AREDS 2) CAPS  03/07/1988   [provider]  neomycin-polymyxin-hydrocortisone (CORTISPORIN) OTIC solution Apply to toe twice daily (after soaks) and cover. 09/10/20   Bronson Ing, DPM  nitroGLYCERIN (NITROSTAT) 0.4 MG SL tablet Place 1 tablet (0.4 mg total) under the tongue every 5 (five) minutes as needed for chest pain. 05/08/17   Wardell Honour, MD  nitroGLYCERIN (NITROSTAT) 0.4 MG SL tablet Place 1 tablet under the tongue every 5 (five) minutes as needed. 04/05/19   [provider]  Omega-3 Fatty Acids (FISH OIL) 1200 MG CAPS Take 1,200 mg by mouth daily.    [provider]  oxyCODONE-acetaminophen (PERCOCET/ROXICET) 5-325 MG tablet Take 1 tablet by mouth every 6 (six) hours as needed for severe pain. Patient taking differently: Take 1 tablet by mouth at bedtime as needed for severe pain. 12/17/16   Wardell Honour, MD  predniSONE (STERAPRED UNI-PAK 21 TAB) 10 MG (21) TBPK tablet See admin instructions. follow package directions 05/26/20   [provider]  pyridoxine (B-6) 100 MG tablet Take by mouth.    [provider]  terbinafine (LAMISIL) 250 MG tablet Take 1 tablet by mouth daily.    [provider]  tizanidine (ZANAFLEX) 2 MG capsule Take  by mouth.    [provider]  tiZANidine (ZANAFLEX) 2 MG tablet Take 1 mg by mouth at bedtime as needed. 10/31/18   [provider]  tiZANidine (ZANAFLEX) 2 MG tablet Take 1 tablet by mouth every evening at bedtime as needed for pain. Okay to take 1/2 tablet if 1 full tablet causes sedation. 03/24/21     tobramycin (TOBREX) 0.3 % ophthalmic solution  09/03/20   [provider]  valACYclovir (VALTREX) 500 MG tablet Take 1 tablet (500 mg total) by mouth 2 (two) times daily. Patient taking differently: Take 500 mg by mouth 2 (two) times daily as needed (cold sores). 03/22/16   Wardell Honour, MD  valACYclovir (VALTREX) 500 MG tablet Take 1 tablet by  mouth 2 (two) times daily. 11/25/19   [provider]  zolpidem (AMBIEN) 5 MG tablet Take 1 tablet (5 mg total) by mouth at bedtime as needed for sleep. Patient taking differently: Take 10 mg by mouth at bedtime. 09/20/17   Wardell Honour, MD      Allergies    Demerol [meperidine], Gabapentin, Codeine, Hydralazine, Other, and Percocet [oxycodone-acetaminophen]    Review of Systems   Review of Systems  Constitutional:  Negative for appetite change and fatigue.  HENT:  Negative for congestion, ear discharge and sinus pressure.   Eyes:  Negative for discharge.  Respiratory:  Negative for cough.   Cardiovascular:  Negative for chest pain.  Gastrointestinal:  Negative for abdominal pain and diarrhea.  Genitourinary:  Negative for frequency and hematuria.  Musculoskeletal:  Negative for back pain.       Shoulder pain  Skin:  Negative for rash.  Neurological:  Negative for seizures and headaches.  Psychiatric/Behavioral:  Negative for hallucinations.    Physical Exam Updated Vital Signs BP 112/67    Pulse 61    Resp 15    Ht 5' 4.5" (1.638 m)    Wt 61.9 kg    SpO2 95%    BMI 23.07 kg/m  Physical Exam Vitals reviewed.  Constitutional:      Appearance: She is well-developed.  HENT:     Head: Normocephalic.  Eyes:      General: No scleral icterus.    Conjunctiva/sclera: Conjunctivae normal.  Neck:     Thyroid: No thyromegaly.  Cardiovascular:     Rate and Rhythm: Normal rate and regular rhythm.     Heart sounds: No murmur heard.   No friction rub. No gallop.  Pulmonary:     Breath sounds: No stridor. No wheezing or rales.  Chest:     Chest wall: No tenderness.  Abdominal:     General: There is no distension.     Tenderness: There is no abdominal tenderness. There is no rebound.  Musculoskeletal:     Cervical back: Neck supple.     Comments: Tender left shoulder with deformity.  Neurovascular normal  Lymphadenopathy:     Cervical: No cervical adenopathy.  Skin:    Findings: No erythema or rash.  Neurological:     Mental Status: She is alert and oriented to person, place, and time.     Motor: No abnormal muscle tone.     Coordination: Coordination normal.  Psychiatric:        Behavior: Behavior normal.    ED Results / Procedures / Treatments   Labs (all labs ordered are listed, but only abnormal results are displayed) Labs Reviewed  BASIC METABOLIC PANEL - Abnormal; Notable for the following components:      Result Value   Glucose, Bld 161 (*)    All other components within normal limits  CBC WITH DIFFERENTIAL/PLATELET    EKG None  Radiology DG Ribs Unilateral W/Chest Right  Result Date: 04/01/2021 CLINICAL DATA:  Right shoulder pain and deformity following a fall today. EXAM: RIGHT RIBS AND CHEST - 3+ VIEW COMPARISON:  Chest radiographs dated 12/03/2016. FINDINGS: Normal sized heart. Mildly tortuous aorta. Interval elevation of the right hemidiaphragm or increased size of a previously demonstrated right diaphragmatic eventration. Associated mild right basilar atelectasis. Otherwise, clear lungs. Right shoulder dislocation described on the shoulder radiographs report. No rib fracture or pneumothorax seen. Cholecystectomy clips. IMPRESSION: 1. Previously described right shoulder  dislocation. 2. No rib fracture or pneumothorax seen.  3. 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 Electronically Signed   By: Claudie Revering M.D.   On: 04/01/2021 12:27   DG Shoulder Right  Result Date: 04/01/2021 CLINICAL DATA:  Status post reduction dislocation. EXAM: RIGHT SHOULDER - 2+ VIEW COMPARISON:  Same day. FINDINGS: There is been successful reduction of previously described right glenohumeral joint dislocation. No definite fracture is noted. IMPRESSION: Successful reduction of previously described right glenohumeral joint dislocation. Electronically Signed   By: Marijo Conception M.D.   On: 04/01/2021 13:56   DG Shoulder Right  Result Date: 04/01/2021 CLINICAL DATA:  Right shoulder pain and deformity following a fall. EXAM: RIGHT SHOULDER - 2+ VIEW COMPARISON:  None. FINDINGS: Inferior and slightly anterior dislocation of the humeral head relative to the glenoid. The superior aspect of the humeral head is impacted against the inferior aspect of the glenoid with a probable small Hill-Sachs deformity. IMPRESSION: Inferior and slightly anterior right shoulder dislocation with a possible associated Hill-Sachs deformity. Electronically Signed   By: Claudie Revering M.D.   On: 04/01/2021 12:25   CT Head Wo Contrast  Result Date: 04/01/2021 CLINICAL DATA:  Right shoulder deformity following a fall today. EXAM: CT HEAD WITHOUT CONTRAST CT MAXILLOFACIAL WITHOUT CONTRAST CT CERVICAL SPINE WITHOUT CONTRAST TECHNIQUE: Multidetector CT imaging of the head, cervical spine, and maxillofacial structures were performed using the standard protocol without intravenous contrast. Multiplanar CT image reconstructions of the cervical spine and maxillofacial structures were also generated. RADIATION DOSE REDUCTION: This exam was performed according to the departmental dose-optimization  program which includes automated exposure control, adjustment of the mA and/or kV according to patient size and/or use of iterative reconstruction technique. COMPARISON:  Head CT dated 03/09/2014.  Brain MRI dated 12/29/2015 FINDINGS: CT HEAD FINDINGS Brain: Normal appearing cerebral hemispheres and posterior fossa structures. Normal size and position of the ventricles. No intracranial hemorrhage, mass lesion or CT evidence of acute infarction. Vascular: No hyperdense vessel or unexpected calcification. Skull: Normal. Negative for fracture or focal lesion. Other: None. CT MAXILLOFACIAL FINDINGS Osseous: No fracture or mandibular dislocation. No destructive process. Orbits: Status post bilateral cataract extraction. Sinuses: Mucosal thickening and retained secretions in the right maxillary sinus with some calcific density material in the retained secretions. Completely opacified sphenoid sinus on the left. Soft tissues: Unremarkable. CT CERVICAL SPINE FINDINGS Alignment: Mild levoconvex cervicothoracic scoliosis. No subluxations. Skull base and vertebrae: No acute fracture. No primary bone lesion or focal pathologic process. Soft tissues and spinal canal: No prevertebral fluid or swelling. No visible canal hematoma. Disc levels:  Multilevel degenerative changes. Upper chest: Clear lung apices. Other: Multiple bilateral thyroid nodules with a normal sized thyroid gland. The largest nodule is in the left lobe, measuring 1.3 cm in maximum diameter. Minimal bilateral carotid artery calcifications. IMPRESSION: 1. No skull fracture or intracranial hemorrhage. 2. No maxillofacial fracture. 3. No cervical spine fracture or subluxation. 4. Chronic left sphenoid and right maxillary sinusitis. The secretions in the right maxillary sinus have some calcific density. This can be seen with fungal sinusitis. 5. Multilevel cervical spine degenerative changes. 6. Multinodular thyroid with a 1.2 cm nodule on the left. Not clinically  significant; no follow-up imaging recommended (ref: J Am Coll Radiol. 2015 Feb;12(2): 143-50). Electronically Signed   By: Claudie Revering M.D.   On: 04/01/2021 12:51   CT Cervical Spine Wo Contrast  Result Date: 04/01/2021 CLINICAL DATA:  Right shoulder  deformity following a fall today. EXAM: CT HEAD WITHOUT CONTRAST CT MAXILLOFACIAL WITHOUT CONTRAST CT CERVICAL SPINE WITHOUT CONTRAST TECHNIQUE: Multidetector CT imaging of the head, cervical spine, and maxillofacial structures were performed using the standard protocol without intravenous contrast. Multiplanar CT image reconstructions of the cervical spine and maxillofacial structures were also generated. RADIATION DOSE REDUCTION: This exam was performed according to the departmental dose-optimization program which includes automated exposure control, adjustment of the mA and/or kV according to patient size and/or use of iterative reconstruction technique. COMPARISON:  Head CT dated 03/09/2014.  Brain MRI dated 12/29/2015 FINDINGS: CT HEAD FINDINGS Brain: Normal appearing cerebral hemispheres and posterior fossa structures. Normal size and position of the ventricles. No intracranial hemorrhage, mass lesion or CT evidence of acute infarction. Vascular: No hyperdense vessel or unexpected calcification. Skull: Normal. Negative for fracture or focal lesion. Other: None. CT MAXILLOFACIAL FINDINGS Osseous: No fracture or mandibular dislocation. No destructive process. Orbits: Status post bilateral cataract extraction. Sinuses: Mucosal thickening and retained secretions in the right maxillary sinus with some calcific density material in the retained secretions. Completely opacified sphenoid sinus on the left. Soft tissues: Unremarkable. CT CERVICAL SPINE FINDINGS Alignment: Mild levoconvex cervicothoracic scoliosis. No subluxations. Skull base and vertebrae: No acute fracture. No primary bone lesion or focal pathologic process. Soft tissues and spinal canal: No  prevertebral fluid or swelling. No visible canal hematoma. Disc levels:  Multilevel degenerative changes. Upper chest: Clear lung apices. Other: Multiple bilateral thyroid nodules with a normal sized thyroid gland. The largest nodule is in the left lobe, measuring 1.3 cm in maximum diameter. Minimal bilateral carotid artery calcifications. IMPRESSION: 1. No skull fracture or intracranial hemorrhage. 2. No maxillofacial fracture. 3. No cervical spine fracture or subluxation. 4. Chronic left sphenoid and right maxillary sinusitis. The secretions in the right maxillary sinus have some calcific density. This can be seen with fungal sinusitis. 5. Multilevel cervical spine degenerative changes. 6. Multinodular thyroid with a 1.2 cm nodule on the left. Not clinically significant; no follow-up imaging recommended (ref: J Am Coll Radiol. 2015 Feb;12(2): 143-50). Electronically Signed   By: Claudie Revering M.D.   On: 04/01/2021 12:51   CT Maxillofacial Wo Contrast  Result Date: 04/01/2021 CLINICAL DATA:  Right shoulder deformity following a fall today. EXAM: CT HEAD WITHOUT CONTRAST CT MAXILLOFACIAL WITHOUT CONTRAST CT CERVICAL SPINE WITHOUT CONTRAST TECHNIQUE: Multidetector CT imaging of the head, cervical spine, and maxillofacial structures were performed using the standard protocol without intravenous contrast. Multiplanar CT image reconstructions of the cervical spine and maxillofacial structures were also generated. RADIATION DOSE REDUCTION: This exam was performed according to the departmental dose-optimization program which includes automated exposure control, adjustment of the mA and/or kV according to patient size and/or use of iterative reconstruction technique. COMPARISON:  Head CT dated 03/09/2014.  Brain MRI dated 12/29/2015 FINDINGS: CT HEAD FINDINGS Brain: Normal appearing cerebral hemispheres and posterior fossa structures. Normal size and position of the ventricles. No intracranial hemorrhage, mass lesion or  CT evidence of acute infarction. Vascular: No hyperdense vessel or unexpected calcification. Skull: Normal. Negative for fracture or focal lesion. Other: None. CT MAXILLOFACIAL FINDINGS Osseous: No fracture or mandibular dislocation. No destructive process. Orbits: Status post bilateral cataract extraction. Sinuses: Mucosal thickening and retained secretions in the right maxillary sinus with some calcific density material in the retained secretions. Completely opacified sphenoid sinus on the left. Soft tissues: Unremarkable. CT CERVICAL SPINE FINDINGS Alignment: Mild levoconvex cervicothoracic scoliosis. No subluxations. Skull base and vertebrae: No acute fracture. No primary bone  lesion or focal pathologic process. Soft tissues and spinal canal: No prevertebral fluid or swelling. No visible canal hematoma. Disc levels:  Multilevel degenerative changes. Upper chest: Clear lung apices. Other: Multiple bilateral thyroid nodules with a normal sized thyroid gland. The largest nodule is in the left lobe, measuring 1.3 cm in maximum diameter. Minimal bilateral carotid artery calcifications. IMPRESSION: 1. No skull fracture or intracranial hemorrhage. 2. No maxillofacial fracture. 3. No cervical spine fracture or subluxation. 4. Chronic left sphenoid and right maxillary sinusitis. The secretions in the right maxillary sinus have some calcific density. This can be seen with fungal sinusitis. 5. Multilevel cervical spine degenerative changes. 6. Multinodular thyroid with a 1.2 cm nodule on the left. Not clinically significant; no follow-up imaging recommended (ref: J Am Coll Radiol. 2015 Feb;12(2): 143-50). Electronically Signed   By: Claudie Revering M.D.   On: 04/01/2021 12:51    Procedures Reduction of dislocation  Date/Time: 04/03/2021 8:55 AM Performed by: Milton Ferguson, MD Authorized by: Milton Ferguson, MD  Comments: Patient had dislocated right shoulder.  Neurovascular exam was normal.  Patient was given 30 mg of  propofol for conscious sedation.  Shoulder was reduced with manual manipulation without problem.  Patient tolerated the procedure well.  Neurovascular exam was normal afterward      Medications Ordered in ED Medications  HYDROmorphone (DILAUDID) injection 0.5 mg (0.5 mg Intravenous Given 04/01/21 1051)  ondansetron (ZOFRAN) injection 4 mg (4 mg Intravenous Given 04/01/21 1120)  HYDROmorphone (DILAUDID) injection 0.5 mg (0.5 mg Intravenous Given 04/01/21 1208)  propofol (DIPRIVAN) 10 mg/mL bolus/IV push 30 mg (30 mg Intravenous Given 04/01/21 1327)  ondansetron (ZOFRAN) injection 4 mg (4 mg Intravenous Given 04/01/21 1439)    ED Course/ Medical Decision Making/ A&P                           Medical Decision Making Amount and/or Complexity of Data Reviewed Labs: ordered. Radiology: ordered.  Risk Prescription drug management.  Dislocated right shoulder with contusion to face nonsuturable laceration Conscious sedation was used and the right shoulder was reduced without problems    This patient presents to the ED for concern of shoulder pain, this involves an extensive number of treatment options, and is a complaint that carries with it a high risk of complications and morbidity.  The differential diagnosis includes fractured shoulder or dislocation of both   Co morbidities that complicate the patient evaluation  Hypertension   Additional history obtained:  Additional history obtained from significant other External records from outside source obtained and reviewed including hospital record   Lab Tests:  I Ordered, and personally interpreted labs.  The pertinent results include: CBC and chemistry that showed elevated glucose   Imaging Studies ordered:  I ordered imaging studies including chest x-ray and right shoulder film I independently visualized and interpreted imaging which showed dislocated shoulder I agree with the radiologist interpretation   Cardiac  Monitoring:  The patient was maintained on a cardiac monitor.  I personally viewed and interpreted the cardiac monitored which showed an underlying rhythm of: Normal sinus rhythm   Medicines ordered and prescription drug management:  I ordered medication including Dilaudid for pain and propofol for sedation Reevaluation of the patient after these medicines showed that the patient improved I have reviewed the patients home medicines and have made adjustments as needed   Test Considered:  CT shoulder   Critical Interventions:  Reduction of dislocation   Consultations Obtained:  No consulted  Problem List / ED Course:  Hypertension and dislocated shoulder   Reevaluation:  After the interventions noted above, I reevaluated the patient and found that they have :improved   Social Determinants of Health:  None   Dispostion:  After consideration of the diagnostic results and the patients response to treatment, I feel that the patent would benefit from discharge home with a sling and follow-up with Ortho.         Final Clinical Impression(s) / ED Diagnoses Final diagnoses:  None    Rx / DC Orders ED Discharge Orders     None         Milton Ferguson, MD 04/03/21 0900

## 2021-04-01 NOTE — Discharge Instructions (Addendum)
Take the Zofran as needed for the nausea.  Okay to take your Excedrin pain medicine that you normally take at home.  Make an appointment follow-up with orthopedics.  Keep your right arm in a sling.  Otherwise the shoulder can dislocate again.  I have to keep the splint on the left arm and forearm dry.  The headache and nausea may be secondary to concussion.  Follow-up with your regular doctor in 1 week if symptoms not improving.  Or if not resolved.

## 2021-04-28 ENCOUNTER — Other Ambulatory Visit (HOSPITAL_COMMUNITY): Payer: Self-pay

## 2021-05-01 ENCOUNTER — Other Ambulatory Visit (HOSPITAL_COMMUNITY): Payer: Self-pay

## 2021-05-01 MED ORDER — FAMOTIDINE 40 MG PO TABS
40.0000 mg | ORAL_TABLET | Freq: Every day | ORAL | 1 refills | Status: DC
  Filled 2021-05-01: qty 90, 90d supply, fill #0
  Filled 2021-08-16: qty 90, 90d supply, fill #1

## 2021-05-04 ENCOUNTER — Other Ambulatory Visit (HOSPITAL_COMMUNITY): Payer: Self-pay

## 2021-05-05 ENCOUNTER — Other Ambulatory Visit (HOSPITAL_COMMUNITY): Payer: Self-pay

## 2021-05-06 ENCOUNTER — Other Ambulatory Visit (HOSPITAL_COMMUNITY): Payer: Self-pay

## 2021-05-06 MED ORDER — CYCLOSPORINE 0.05 % OP EMUL
OPHTHALMIC | 11 refills | Status: DC
  Filled 2021-05-06: qty 180, 45d supply, fill #0
  Filled 2021-08-10: qty 180, 90d supply, fill #1

## 2021-05-07 ENCOUNTER — Other Ambulatory Visit (HOSPITAL_COMMUNITY): Payer: Self-pay

## 2021-05-11 ENCOUNTER — Other Ambulatory Visit (HOSPITAL_COMMUNITY): Payer: Self-pay

## 2021-05-26 ENCOUNTER — Other Ambulatory Visit (HOSPITAL_COMMUNITY): Payer: Self-pay

## 2021-05-26 MED ORDER — LOSARTAN POTASSIUM 25 MG PO TABS
25.0000 mg | ORAL_TABLET | Freq: Every day | ORAL | 4 refills | Status: DC
  Filled 2021-05-26 – 2021-08-16 (×2): qty 90, 90d supply, fill #0

## 2021-05-26 MED ORDER — ZOLPIDEM TARTRATE 5 MG PO TABS
ORAL_TABLET | ORAL | 1 refills | Status: DC
  Filled 2021-05-26: qty 135, 90d supply, fill #0

## 2021-05-27 ENCOUNTER — Other Ambulatory Visit: Payer: Self-pay | Admitting: Family Medicine

## 2021-05-27 DIAGNOSIS — E041 Nontoxic single thyroid nodule: Secondary | ICD-10-CM

## 2021-06-02 ENCOUNTER — Other Ambulatory Visit (HOSPITAL_COMMUNITY): Payer: Self-pay

## 2021-06-02 MED ORDER — AMLODIPINE BESYLATE 5 MG PO TABS
5.0000 mg | ORAL_TABLET | Freq: Every day | ORAL | 3 refills | Status: DC
  Filled 2021-06-02: qty 90, 90d supply, fill #0
  Filled 2021-08-16: qty 90, 90d supply, fill #1

## 2021-06-03 ENCOUNTER — Other Ambulatory Visit (HOSPITAL_COMMUNITY): Payer: Self-pay

## 2021-06-07 ENCOUNTER — Other Ambulatory Visit: Payer: Medicare Other

## 2021-06-07 ENCOUNTER — Ambulatory Visit
Admission: RE | Admit: 2021-06-07 | Discharge: 2021-06-07 | Disposition: A | Discharge status: Home / Self Care | Payer: Medicare Other | Source: Ambulatory Visit | Attending: Family Medicine | Admitting: Family Medicine

## 2021-06-07 DIAGNOSIS — E041 Nontoxic single thyroid nodule: Secondary | ICD-10-CM

## 2021-07-27 ENCOUNTER — Other Ambulatory Visit (HOSPITAL_COMMUNITY): Payer: Self-pay

## 2021-07-27 MED ORDER — ZOLPIDEM TARTRATE 5 MG PO TABS
ORAL_TABLET | ORAL | 1 refills | Status: DC
  Filled 2021-07-27: qty 135, 90d supply, fill #0
  Filled 2021-08-03: qty 135, 68d supply, fill #0
  Filled 2021-10-29: qty 135, 68d supply, fill #1

## 2021-07-30 ENCOUNTER — Other Ambulatory Visit (HOSPITAL_COMMUNITY): Payer: Self-pay

## 2021-08-03 ENCOUNTER — Other Ambulatory Visit (HOSPITAL_COMMUNITY): Payer: Self-pay

## 2021-08-04 ENCOUNTER — Other Ambulatory Visit (HOSPITAL_COMMUNITY): Payer: Self-pay

## 2021-08-04 ENCOUNTER — Encounter: Payer: Self-pay | Admitting: Neurology

## 2021-08-10 ENCOUNTER — Other Ambulatory Visit (HOSPITAL_COMMUNITY): Payer: Self-pay

## 2021-08-10 MED ORDER — METOPROLOL SUCCINATE ER 50 MG PO TB24
50.0000 mg | ORAL_TABLET | Freq: Every day | ORAL | 4 refills | Status: DC
  Filled 2021-08-10: qty 90, 90d supply, fill #0

## 2021-08-11 ENCOUNTER — Other Ambulatory Visit (HOSPITAL_COMMUNITY): Payer: Self-pay

## 2021-08-16 ENCOUNTER — Other Ambulatory Visit (HOSPITAL_COMMUNITY): Payer: Self-pay

## 2021-08-17 ENCOUNTER — Ambulatory Visit: Payer: Medicare Other | Admitting: Podiatry

## 2021-08-17 ENCOUNTER — Other Ambulatory Visit (HOSPITAL_COMMUNITY): Payer: Self-pay

## 2021-08-17 MED ORDER — MELOXICAM 7.5 MG PO TABS
7.5000 mg | ORAL_TABLET | Freq: Two times a day (BID) | ORAL | 0 refills | Status: DC | PRN
  Filled 2021-08-17: qty 180, 90d supply, fill #0

## 2021-08-23 ENCOUNTER — Other Ambulatory Visit: Payer: Self-pay | Admitting: Family Medicine

## 2021-08-23 DIAGNOSIS — R296 Repeated falls: Secondary | ICD-10-CM

## 2021-08-23 DIAGNOSIS — R4789 Other speech disturbances: Secondary | ICD-10-CM

## 2021-08-23 DIAGNOSIS — R4701 Aphasia: Secondary | ICD-10-CM

## 2021-08-25 ENCOUNTER — Ambulatory Visit
Admission: RE | Admit: 2021-08-25 | Discharge: 2021-08-25 | Disposition: A | Discharge status: Home / Self Care | Payer: Medicare Other | Source: Ambulatory Visit | Attending: Family Medicine | Admitting: Family Medicine

## 2021-08-25 DIAGNOSIS — R4701 Aphasia: Secondary | ICD-10-CM

## 2021-08-25 DIAGNOSIS — R4789 Other speech disturbances: Secondary | ICD-10-CM

## 2021-08-25 DIAGNOSIS — R296 Repeated falls: Secondary | ICD-10-CM

## 2021-08-25 MED ORDER — GADOBENATE DIMEGLUMINE 529 MG/ML IV SOLN
12.0000 mL | Freq: Once | INTRAVENOUS | Status: AC | PRN
  Administered 2021-08-25: 12 mL via INTRAVENOUS

## 2021-08-26 ENCOUNTER — Other Ambulatory Visit: Payer: Self-pay | Admitting: Family Medicine

## 2021-08-26 ENCOUNTER — Other Ambulatory Visit: Payer: Self-pay | Admitting: Certified Registered Nurse Anesthetist

## 2021-09-01 ENCOUNTER — Other Ambulatory Visit (HOSPITAL_COMMUNITY): Payer: Self-pay

## 2021-09-01 ENCOUNTER — Encounter: Payer: Self-pay | Admitting: Podiatry

## 2021-09-01 ENCOUNTER — Ambulatory Visit (INDEPENDENT_AMBULATORY_CARE_PROVIDER_SITE_OTHER): Payer: Medicare Other | Admitting: Podiatry

## 2021-09-01 DIAGNOSIS — G629 Polyneuropathy, unspecified: Secondary | ICD-10-CM

## 2021-09-01 DIAGNOSIS — M2170 Unequal limb length (acquired), unspecified site: Secondary | ICD-10-CM | POA: Diagnosis not present

## 2021-09-01 MED ORDER — AMOXICILLIN-POT CLAVULANATE 875-125 MG PO TABS
ORAL_TABLET | ORAL | 0 refills | Status: DC
  Filled 2021-09-01: qty 28, 14d supply, fill #0
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
## 2021-09-02 ENCOUNTER — Other Ambulatory Visit: Payer: Self-pay | Admitting: Family Medicine

## 2021-09-02 DIAGNOSIS — M899 Disorder of bone, unspecified: Secondary | ICD-10-CM

## 2021-09-09 ENCOUNTER — Other Ambulatory Visit (HOSPITAL_COMMUNITY): Payer: Self-pay

## 2021-09-09 MED ORDER — METOPROLOL SUCCINATE ER 50 MG PO TB24
50.0000 mg | ORAL_TABLET | Freq: Two times a day (BID) | ORAL | 4 refills | Status: DC
  Filled 2021-09-09: qty 180, 90d supply, fill #0

## 2021-09-09 MED ORDER — LOSARTAN POTASSIUM 25 MG PO TABS
25.0000 mg | ORAL_TABLET | Freq: Two times a day (BID) | ORAL | 4 refills | Status: DC
  Filled 2021-09-09: qty 180, 90d supply, fill #0
  Filled 2021-10-29 – 2022-01-27 (×2): qty 180, 90d supply, fill #1

## 2021-09-10 ENCOUNTER — Other Ambulatory Visit (HOSPITAL_COMMUNITY): Payer: Self-pay

## 2021-09-20 ENCOUNTER — Encounter (HOSPITAL_COMMUNITY)
Admission: RE | Admit: 2021-09-20 | Discharge: 2021-09-20 | Disposition: A | Discharge status: Home / Self Care | Payer: Medicare Other | Source: Ambulatory Visit | Attending: Family Medicine | Admitting: Family Medicine

## 2021-09-20 DIAGNOSIS — M899 Disorder of bone, unspecified: Secondary | ICD-10-CM | POA: Insufficient documentation

## 2021-09-20 MED ORDER — TECHNETIUM TC 99M MEDRONATE IV KIT
20.0000 | PACK | Freq: Once | INTRAVENOUS | Status: AC | PRN
  Administered 2021-09-20: 20.8 via INTRAVENOUS

## 2021-09-22 ENCOUNTER — Other Ambulatory Visit (HOSPITAL_COMMUNITY): Payer: Self-pay | Admitting: Family Medicine

## 2021-09-22 ENCOUNTER — Ambulatory Visit (HOSPITAL_COMMUNITY)
Admission: RE | Admit: 2021-09-22 | Discharge: 2021-09-22 | Disposition: A | Discharge status: Home / Self Care | Payer: Medicare Other | Source: Ambulatory Visit | Attending: Family Medicine | Admitting: Family Medicine

## 2021-09-22 ENCOUNTER — Other Ambulatory Visit (HOSPITAL_COMMUNITY): Payer: Self-pay

## 2021-09-22 DIAGNOSIS — M898X9 Other specified disorders of bone, unspecified site: Secondary | ICD-10-CM

## 2021-09-22 DIAGNOSIS — M899 Disorder of bone, unspecified: Secondary | ICD-10-CM

## 2021-09-22 MED ORDER — BUPROPION HCL ER (XL) 300 MG PO TB24
ORAL_TABLET | ORAL | 1 refills | Status: DC
  Filled 2021-09-22: qty 90, 90d supply, fill #0

## 2021-09-24 ENCOUNTER — Ambulatory Visit (HOSPITAL_COMMUNITY)
Admission: RE | Admit: 2021-09-24 | Discharge: 2021-09-24 | Disposition: A | Discharge status: Home / Self Care | Payer: Medicare Other | Source: Ambulatory Visit | Attending: Family Medicine | Admitting: Family Medicine

## 2021-09-24 ENCOUNTER — Other Ambulatory Visit (HOSPITAL_COMMUNITY): Payer: Self-pay | Admitting: Family Medicine

## 2021-09-24 DIAGNOSIS — M899 Disorder of bone, unspecified: Secondary | ICD-10-CM | POA: Insufficient documentation

## 2021-09-25 ENCOUNTER — Other Ambulatory Visit (HOSPITAL_COMMUNITY): Payer: Self-pay

## 2021-09-28 ENCOUNTER — Ambulatory Visit (HOSPITAL_COMMUNITY)
Admission: RE | Admit: 2021-09-28 | Discharge: 2021-09-28 | Disposition: A | Discharge status: Home / Self Care | Payer: Medicare Other | Source: Ambulatory Visit | Attending: Family Medicine | Admitting: Family Medicine

## 2021-09-28 ENCOUNTER — Other Ambulatory Visit (HOSPITAL_COMMUNITY): Payer: Self-pay | Admitting: Family Medicine

## 2021-09-28 ENCOUNTER — Other Ambulatory Visit: Payer: Medicare Other

## 2021-09-28 DIAGNOSIS — M899 Disorder of bone, unspecified: Secondary | ICD-10-CM | POA: Insufficient documentation

## 2021-10-07 ENCOUNTER — Other Ambulatory Visit (HOSPITAL_COMMUNITY): Payer: Self-pay

## 2021-10-13 ENCOUNTER — Other Ambulatory Visit (HOSPITAL_COMMUNITY): Payer: Self-pay

## 2021-10-13 MED ORDER — BENZONATATE 100 MG PO CAPS
ORAL_CAPSULE | ORAL | 0 refills | Status: DC
Start: 2021-10-13 — End: 2021-11-05
  Filled 2021-10-13: qty 20, 7d supply, fill #0

## 2021-10-15 ENCOUNTER — Other Ambulatory Visit (HOSPITAL_COMMUNITY): Payer: Self-pay

## 2021-10-15 MED ORDER — PAXLOVID (300/100) 20 X 150 MG & 10 X 100MG PO TBPK
ORAL_TABLET | ORAL | 0 refills | Status: DC
Start: 2021-10-14 — End: 2021-11-05
  Filled 2021-10-15: qty 30, 5d supply, fill #0

## 2021-10-29 ENCOUNTER — Other Ambulatory Visit (HOSPITAL_COMMUNITY): Payer: Self-pay

## 2021-10-30 ENCOUNTER — Other Ambulatory Visit (HOSPITAL_COMMUNITY): Payer: Self-pay

## 2021-11-01 ENCOUNTER — Other Ambulatory Visit (HOSPITAL_COMMUNITY): Payer: Self-pay

## 2021-11-01 MED ORDER — TIZANIDINE HCL 2 MG PO TABS
2.0000 mg | ORAL_TABLET | Freq: Every evening | ORAL | 0 refills | Status: DC
  Filled 2021-11-01: qty 90, 90d supply, fill #0

## 2021-11-01 MED ORDER — MELOXICAM 7.5 MG PO TABS
7.5000 mg | ORAL_TABLET | Freq: Two times a day (BID) | ORAL | 0 refills | Status: DC | PRN
  Filled 2021-11-01: qty 180, 90d supply, fill #0

## 2021-11-02 NOTE — Progress Notes (Addendum)
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 11/17/21 9:21 AM   Received MRI lumbar spine report from Raliegh Ip from 03/13/21:

## 2021-11-03 ENCOUNTER — Other Ambulatory Visit (HOSPITAL_COMMUNITY): Payer: Self-pay

## 2021-11-03 MED ORDER — CYCLOSPORINE 0.05 % OP EMUL
1.0000 [drp] | Freq: Two times a day (BID) | OPHTHALMIC | 11 refills | Status: DC
  Filled 2021-11-03: qty 180, 90d supply, fill #0

## 2021-11-04 ENCOUNTER — Other Ambulatory Visit (HOSPITAL_COMMUNITY): Payer: Self-pay

## 2021-11-04 MED ORDER — TOBRAMYCIN 0.3 % OP SOLN
OPHTHALMIC | 5 refills | Status: DC
  Filled 2021-11-04: qty 5, 10d supply, fill #0
  Filled 2022-01-27: qty 5, 10d supply, fill #1
  Filled 2022-02-09: qty 5, 10d supply, fill #2
  Filled 2022-02-17: qty 5, 10d supply, fill #3
  Filled 2022-03-01: qty 5, 10d supply, fill #4
  Filled 2022-03-11: qty 5, 10d supply, fill #5

## 2021-11-05 ENCOUNTER — Ambulatory Visit (INDEPENDENT_AMBULATORY_CARE_PROVIDER_SITE_OTHER): Payer: Medicare Other | Admitting: Neurology

## 2021-11-05 ENCOUNTER — Encounter: Payer: Self-pay | Admitting: Neurology

## 2021-11-05 ENCOUNTER — Other Ambulatory Visit (HOSPITAL_COMMUNITY): Payer: Self-pay

## 2021-11-05 ENCOUNTER — Other Ambulatory Visit (INDEPENDENT_AMBULATORY_CARE_PROVIDER_SITE_OTHER): Payer: Medicare Other

## 2021-11-05 VITALS — BP 112/74 | HR 94 | Ht 64.5 in | Wt 131.0 lb

## 2021-11-05 DIAGNOSIS — R739 Hyperglycemia, unspecified: Secondary | ICD-10-CM | POA: Diagnosis not present

## 2021-11-05 DIAGNOSIS — M545 Low back pain, unspecified: Secondary | ICD-10-CM | POA: Diagnosis not present

## 2021-11-05 DIAGNOSIS — R209 Unspecified disturbances of skin sensation: Secondary | ICD-10-CM

## 2021-11-05 DIAGNOSIS — E538 Deficiency of other specified B group vitamins: Secondary | ICD-10-CM

## 2021-11-05 DIAGNOSIS — M48061 Spinal stenosis, lumbar region without neurogenic claudication: Secondary | ICD-10-CM | POA: Diagnosis not present

## 2021-11-05 DIAGNOSIS — R296 Repeated falls: Secondary | ICD-10-CM

## 2021-11-05 DIAGNOSIS — G8929 Other chronic pain: Secondary | ICD-10-CM

## 2021-11-05 LAB — VITAMIN B12: Vitamin B-12: 281 pg/mL (ref 211–911)

## 2021-11-05 LAB — HEMOGLOBIN A1C: Hgb A1c MFr Bld: 6.2 % (ref 4.6–6.5)

## 2021-11-05 NOTE — Patient Instructions (Signed)
I saw you today for recurrent falls. I want to investigate further with lab work and an EMG of your right leg.  We also discussed your intermittent aphasia. We will continue to monitor this and discuss further in the future.  I will be in touch when I have your lab results.  Return to clinic in 3 months.  Please let me know if you have any questions or concerns in the meantime.  Kai Levins, MD    ELECTROMYOGRAM AND NERVE CONDUCTION STUDIES (EMG/NCS) INSTRUCTIONS  How to Prepare The neurologist conducting the EMG will need to know if you have certain medical conditions. Tell the neurologist and other EMG lab personnel if you: Have a pacemaker or any other electrical medical device Take blood-thinning medications Have hemophilia, a blood-clotting disorder that causes prolonged bleeding Bathing Take a shower or bath shortly before your exam in order to remove oils from your skin. Don't apply lotions or creams before the exam.  What to Expect You'll likely be asked to change into a hospital gown for the procedure and lie down on an examination table. The following explanations can help you understand what will happen during the exam.  Electrodes. The neurologist or a technician places surface electrodes at various locations on your skin depending on where you're experiencing symptoms. Or the neurologist may insert needle electrodes at different sites depending on your symptoms.  Sensations. The electrodes will at times transmit a tiny electrical current that you may feel as a twinge or spasm. The needle electrode may cause discomfort or pain that usually ends shortly after the needle is removed. If you are concerned about discomfort or pain, you may want to talk to the neurologist about taking a short break during the exam.  Instructions. During the needle EMG, the neurologist will assess whether there is any spontaneous electrical activity when the muscle is at rest - activity that isn't  present in healthy muscle tissue - and the degree of activity when you slightly contract the muscle.  He or she will give you instructions on resting and contracting a muscle at appropriate times. Depending on what muscles and nerves the neurologist is examining, he or she may ask you to change positions during the exam.  After your EMG You may experience some temporary, minor bruising where the needle electrode was inserted into your muscle. This bruising should fade within several days. If it persists, contact your primary care doctor.    Prevent Falls at [facility] are common, often dreaded events in the lives of older people. Aside from the obvious injuries and even death that may result, fall can cause wide-ranging consequences including loss of independence, mental decline, decreased activity and mobility. Younger people are also at risk of falling, especially those with chronic illnesses and fatigue.  Ways to reduce risk for falling Examine diet and medications. Warm foods and alcohol dilate blood vessels, which can lead to dizziness when standing. Sleep aids, antidepressants and pain medications can also increase the likelihood of a fall.  Get a vision exam. Poor vision, cataracts and glaucoma increase the chances of falling.  Check foot gear. Shoes should fit snugly and have a sturdy, nonskid sole and a broad, low heel  Participate in a physician-approved exercise program to build and maintain muscle strength and improve balance and coordination. Programs that use ankle weights or stretch bands are excellent for muscle-strengthening. Water aerobics programs and low-impact Tai Chi programs have also been shown to improve balance and coordination.  Increase vitamin D intake. Vitamin D improves muscle strength and increases the amount of calcium the body is able to absorb and deposit in bones.  How to prevent falls from common hazards Floors - Remove all loose wires, cords, and throw  rugs. Minimize clutter. Make sure rugs are anchored and smooth. Keep furniture in its usual place.  Chairs -- Use chairs with straight backs, armrests and firm seats. Add firm cushions to existing pieces to add height.  Bathroom - Install grab bars and non-skid tape in the tub or shower. Use a bathtub transfer bench or a shower chair with a back support Use an elevated toilet seat and/or safety rails to assist standing from a low surface. Do not use towel racks or bathroom tissue holders to help you stand.  Lighting - Make sure halls, stairways, and entrances are well-lit. Install a night light in your bathroom or hallway. Make sure there is a light switch at the top and bottom of the staircase. Turn lights on if you get up in the middle of the night. Make sure lamps or light switches are within reach of the bed if you have to get up during the night.  Kitchen - Install non-skid rubber mats near the sink and stove. Clean spills immediately. Store frequently used utensils, pots, pans between waist and eye level. This helps prevent reaching and bending. Sit when getting things out of lower cupboards.  Living room/ Bedrooms - Place furniture with wide spaces in between, giving enough room to move around. Establish a route through the living room that gives you something to hold onto as you walk.  Stairs - Make sure treads, rails, and rugs are secure. Install a rail on both sides of the stairs. If stairs are a threat, it might be helpful to arrange most of your activities on the lower level to reduce the number of times you must climb the stairs.  Entrances and doorways - Install metal handles on the walls adjacent to the doorknobs of all doors to make it more secure as you travel through the doorway.  Tips for maintaining balance Keep at least one hand free at all times. Try using a backpack or fanny pack to hold things rather than carrying them in your hands. Never carry objects in both hands when  walking as this interferes with keeping your balance.  Attempt to swing both arms from front to back while walking. This might require a conscious effort if Parkinson's disease has diminished your movement. It will, however, help you to maintain balance and posture, and reduce fatigue.  Consciously lift your feet off of the ground when walking. Shuffling and dragging of the feet is a common culprit in losing your balance.  When trying to navigate turns, use a "U" technique of facing forward and making a wide turn, rather than pivoting sharply.  Try to stand with your feet shoulder-length apart. When your feet are close together for any length of time, you increase your risk of losing your balance and falling.  Do one thing at a time. Don't try to walk and accomplish another task, such as reading or looking around. The decrease in your automatic reflexes complicates motor function, so the less distraction, the better.  Do not wear rubber or gripping soled shoes, they might "catch" on the floor and cause tripping.  Move slowly when changing positions. Use deliberate, concentrated movements and, if needed, use a grab bar or walking aid. Count 15 seconds between each movement.  For example, when rising from a seated position, wait 15 seconds after standing to begin walking.  If balance is a continuous problem, you might want to consider a walking aid such as a cane, walking stick, or walker. Once you've mastered walking with help, you might be ready to try it on your own again.

## 2021-11-06 ENCOUNTER — Other Ambulatory Visit (HOSPITAL_COMMUNITY): Payer: Self-pay

## 2021-11-06 MED ORDER — CYCLOSPORINE 0.05 % OP EMUL
1.0000 [drp] | Freq: Two times a day (BID) | OPHTHALMIC | 4 refills | Status: DC
  Filled 2021-11-06: qty 180, 90d supply, fill #0
  Filled 2022-01-27: qty 180, 90d supply, fill #1
  Filled 2022-03-01 – 2022-04-26 (×2): qty 180, 90d supply, fill #2
  Filled 2022-07-22: qty 180, 90d supply, fill #3
  Filled 2022-10-21: qty 180, 90d supply, fill #4

## 2021-11-09 ENCOUNTER — Other Ambulatory Visit (HOSPITAL_COMMUNITY): Payer: Self-pay

## 2021-11-09 ENCOUNTER — Telehealth: Payer: Self-pay | Admitting: Neurology

## 2021-11-09 NOTE — Telephone Encounter (Signed)
Pt states her rest

## 2021-11-09 NOTE — Telephone Encounter (Signed)
Error, wrong office.

## 2021-11-10 LAB — COPPER, SERUM: Copper: 105 ug/dL (ref 70–175)

## 2021-11-10 LAB — PROTEIN ELECTROPHORESIS, SERUM
Albumin ELP: 3.7 g/dL — ABNORMAL LOW (ref 3.8–4.8)
Alpha 1: 0.3 g/dL (ref 0.2–0.3)
Alpha 2: 0.7 g/dL (ref 0.5–0.9)
Beta 2: 0.3 g/dL (ref 0.2–0.5)
Beta Globulin: 0.4 g/dL (ref 0.4–0.6)
Gamma Globulin: 0.6 g/dL — ABNORMAL LOW (ref 0.8–1.7)
Total Protein: 6 g/dL — ABNORMAL LOW (ref 6.1–8.1)

## 2021-11-12 ENCOUNTER — Encounter: Payer: Self-pay | Admitting: Neurology

## 2021-11-15 ENCOUNTER — Other Ambulatory Visit: Payer: Self-pay | Admitting: Family Medicine

## 2021-11-15 DIAGNOSIS — Z1231 Encounter for screening mammogram for malignant neoplasm of breast: Secondary | ICD-10-CM

## 2021-11-16 LAB — IMMUNOFIXATION, SERUM
IgA/Immunoglobulin A, Serum: 143 mg/dL (ref 64–422)
IgG (Immunoglobin G), Serum: 649 mg/dL (ref 586–1602)
IgM (Immunoglobulin M), Srm: 45 mg/dL (ref 26–217)

## 2021-12-05 ENCOUNTER — Other Ambulatory Visit (HOSPITAL_COMMUNITY): Payer: Self-pay

## 2021-12-06 ENCOUNTER — Other Ambulatory Visit (HOSPITAL_COMMUNITY): Payer: Self-pay

## 2021-12-07 ENCOUNTER — Other Ambulatory Visit (HOSPITAL_COMMUNITY): Payer: Self-pay

## 2021-12-07 ENCOUNTER — Ambulatory Visit
Admission: RE | Admit: 2021-12-07 | Discharge: 2021-12-07 | Disposition: A | Discharge status: Home / Self Care | Payer: Medicare Other | Source: Ambulatory Visit

## 2021-12-07 DIAGNOSIS — Z1231 Encounter for screening mammogram for malignant neoplasm of breast: Secondary | ICD-10-CM

## 2021-12-07 MED ORDER — ZOLPIDEM TARTRATE 5 MG PO TABS
5.0000 mg | ORAL_TABLET | Freq: Every evening | ORAL | 0 refills | Status: DC
  Filled 2021-12-07: qty 135, 135d supply, fill #0
  Filled 2022-01-27: qty 135, 67d supply, fill #0

## 2021-12-09 ENCOUNTER — Other Ambulatory Visit (HOSPITAL_COMMUNITY): Payer: Self-pay

## 2021-12-09 MED ORDER — ATORVASTATIN CALCIUM 10 MG PO TABS
10.0000 mg | ORAL_TABLET | Freq: Every day | ORAL | 4 refills | Status: DC
  Filled 2021-12-09 – 2022-01-27 (×2): qty 90, 90d supply, fill #0
  Filled 2022-02-09 – 2022-04-26 (×3): qty 90, 90d supply, fill #1
  Filled 2022-06-04 – 2022-07-22 (×2): qty 90, 90d supply, fill #2
  Filled 2022-10-21: qty 90, 90d supply, fill #3

## 2021-12-09 MED ORDER — VALACYCLOVIR HCL 500 MG PO TABS
500.0000 mg | ORAL_TABLET | Freq: Two times a day (BID) | ORAL | 3 refills | Status: DC
  Filled 2021-12-09: qty 180, 90d supply, fill #0
  Filled 2022-01-27 – 2022-03-01 (×2): qty 180, 90d supply, fill #1
  Filled 2022-07-22: qty 180, 90d supply, fill #2
  Filled 2022-11-16: qty 180, 90d supply, fill #3

## 2021-12-09 MED ORDER — BUPROPION HCL ER (XL) 150 MG PO TB24
150.0000 mg | ORAL_TABLET | Freq: Every day | ORAL | 4 refills | Status: DC
  Filled 2021-12-09: qty 90, 90d supply, fill #0
  Filled 2022-01-27 – 2022-03-01 (×3): qty 90, 90d supply, fill #1
  Filled 2022-07-22: qty 90, 90d supply, fill #2
  Filled 2022-10-21: qty 90, 90d supply, fill #3

## 2021-12-09 MED ORDER — METOPROLOL SUCCINATE ER 50 MG PO TB24
50.0000 mg | ORAL_TABLET | Freq: Two times a day (BID) | ORAL | 4 refills | Status: DC
  Filled 2021-12-09: qty 180, 90d supply, fill #0
  Filled 2022-01-27 – 2022-03-01 (×3): qty 180, 90d supply, fill #1
  Filled 2022-06-04: qty 180, 90d supply, fill #2
  Filled 2022-09-05: qty 180, 90d supply, fill #3
  Filled 2022-11-16: qty 180, 90d supply, fill #4

## 2021-12-17 ENCOUNTER — Encounter: Payer: Self-pay | Admitting: Neurology

## 2021-12-27 ENCOUNTER — Telehealth: Payer: Self-pay | Admitting: Neurology

## 2021-12-27 ENCOUNTER — Ambulatory Visit (INDEPENDENT_AMBULATORY_CARE_PROVIDER_SITE_OTHER): Payer: Medicare Other | Admitting: Neurology

## 2021-12-27 DIAGNOSIS — M5417 Radiculopathy, lumbosacral region: Secondary | ICD-10-CM

## 2021-12-27 DIAGNOSIS — R296 Repeated falls: Secondary | ICD-10-CM | POA: Diagnosis not present

## 2021-12-27 DIAGNOSIS — M545 Low back pain, unspecified: Secondary | ICD-10-CM

## 2021-12-27 NOTE — Procedures (Signed)
[facility]  [place], [address]  [place], [place] [address] Tel: [phone] Fax: [phone] Test Date:  12/27/2021  Patient: Kaylee Weiss DOB: [birth year] Physician: Kai Levins, MD  Sex: Female Height: 5' 4.5" Ref Phys: Kai Levins, MD  ID#: [id number]   Technician:    History: This is a 76 year old female with difficulty walking, right leg numbness, and right foot weakness.  NCV & EMG Findings: Extensive electrodiagnostic evaluation of the right lower limb with additional nerve conduction studies of the left lower limb shows: Right superficial fibular sensory response shows reduced amplitude (2 V). Right sural and left superficial fibular sensory responses are within normal limits. Right fibular (EDB) and tibial (AH) motor responses are within normal limits. Right H Reflex latency is within normal limits. Chronic motor axon loss changes without accompanying active denervation are seen in all muscles tested (right tibialis anterior, medial head of gastrocnemius, flexor digitorum longus, short head of biceps femoris, rectus femoris, gluteus medius, and L5 paraspinal muscles).  Impression: This is an abnormal electrodiagnostic evaluation. 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 The findings are moderate to severe in degree electrically at the right L3-4 nerve roots and mild to moderate in degree electrically in the L5-S1 nerve roots. Asymmetric and low amplitude right superficial sensory response may be technical in nature or indicate an overlapping right peroneal mononeuropathy. This is not able to be further localized given #1 above. No electrodiagnostic evidence of a generalized polyneuropathy.   ___________________________ Kai Levins, MD    Nerve Conduction Studies Motor Nerve Results    Latency Amplitude F-Lat Segment Distance CV Comment  Site  (ms) Norm (mV) Norm (ms)  (cm) (m/s) Norm   Right Fibular (EDB) Motor  Ankle 4.4  < 6.0 3.2  > 2.5        Bel fib head 11.3 - 3.2 -  Bel fib head-Ankle 29 42  > 40   Pop fossa 13.3 - 2.9 -  Pop fossa-Bel fib head 8 40 -   Right Tibial (AH) Motor  Ankle 4.8  < 6.0 6.8  > 4.0        Knee 13.8 - 6.8 -  Knee-Ankle 38 42  > 40    Sensory Sites    Neg Peak Lat Amplitude (O-P) Segment Distance Velocity Comment  Site (ms) Norm (V) Norm  (cm) (ms)   Left Superficial Fibular Sensory  14 cm-Ankle 3.6  < 4.6 4  > 3 14 cm-Ankle 14    Right Superficial Fibular Sensory  14 cm-Ankle 4.0  < 4.6 *2  > 3 14 cm-Ankle 14    Right Sural Sensory  Calf-Lat mall 4.3  < 4.6 6  > 3 Calf-Lat mall 14     H-Reflex Results    M-Lat H Lat H Neg Amp H-M Lat  Site (ms) (ms) Norm (mV) (ms)  Right Tibial H-Reflex  Pop fossa 6.1 32.3  < 35.0 1.95 26.2   Electromyography   Side Muscle Ins.Act Fibs Fasc Recrt Amp Dur Poly Activation Comment  Right Tib ant Nml Nml Nml *1- *1+ *1+ *1+ Nml N/A  Right Gastroc MH Nml Nml Nml *2- *1+ *1+ Nml Nml N/A  Right FDL Nml Nml Nml *2- *1+ *1+ Nml Nml N/A  Right Biceps fem SH Nml Nml Nml *2- *1+ *1+ Nml Nml N/A  Right Add longus Nml Nml Nml *2- *1+ *1+ *  1+ Nml N/A  Right Gluteus med Nml Nml Nml *1- *1+ *1+ Nml Nml N/A  Right Lumbar PSP lower Nml Nml Nml *2- *1+ *1+ Nml Nml N/A  Right Rectus fem Nml Nml Nml *3- *2+ *2+ *2+ Nml N/A      Waveforms:  Motor      Sensory        H-Reflex

## 2021-12-27 NOTE — Telephone Encounter (Signed)
Discussed the results of her EMG after the procedure today. It showed the residuals of L3-S1 radiculopathy. Patient is getting stronger with therapy and will continue therapy and follow up with me 02/09/22.  All questions were answered.  Kai Levins, MD [facility]
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
## 2022-01-20 ENCOUNTER — Other Ambulatory Visit (HOSPITAL_COMMUNITY): Payer: Self-pay

## 2022-01-20 MED ORDER — BUSPIRONE HCL 5 MG PO TABS
5.0000 mg | ORAL_TABLET | Freq: Two times a day (BID) | ORAL | 0 refills | Status: DC
  Filled 2022-01-20: qty 40, 10d supply, fill #0

## 2022-01-21 ENCOUNTER — Encounter: Payer: Self-pay | Admitting: Neurology

## 2022-01-21 ENCOUNTER — Other Ambulatory Visit (HOSPITAL_COMMUNITY): Payer: Self-pay

## 2022-01-21 ENCOUNTER — Ambulatory Visit (INDEPENDENT_AMBULATORY_CARE_PROVIDER_SITE_OTHER): Payer: Medicare Other | Admitting: Neurology

## 2022-01-21 ENCOUNTER — Other Ambulatory Visit (INDEPENDENT_AMBULATORY_CARE_PROVIDER_SITE_OTHER): Payer: Medicare Other

## 2022-01-21 VITALS — BP 158/75 | HR 63

## 2022-01-21 DIAGNOSIS — M48061 Spinal stenosis, lumbar region without neurogenic claudication: Secondary | ICD-10-CM

## 2022-01-21 DIAGNOSIS — M545 Low back pain, unspecified: Secondary | ICD-10-CM | POA: Diagnosis not present

## 2022-01-21 DIAGNOSIS — M5417 Radiculopathy, lumbosacral region: Secondary | ICD-10-CM

## 2022-01-21 DIAGNOSIS — E538 Deficiency of other specified B group vitamins: Secondary | ICD-10-CM

## 2022-01-21 DIAGNOSIS — R296 Repeated falls: Secondary | ICD-10-CM

## 2022-01-21 DIAGNOSIS — R209 Unspecified disturbances of skin sensation: Secondary | ICD-10-CM

## 2022-01-21 DIAGNOSIS — G8929 Other chronic pain: Secondary | ICD-10-CM

## 2022-01-21 DIAGNOSIS — N3942 Incontinence without sensory awareness: Secondary | ICD-10-CM

## 2022-01-21 LAB — VITAMIN B12: Vitamin B-12: 1163 pg/mL — ABNORMAL HIGH (ref 211–911)

## 2022-01-27 ENCOUNTER — Other Ambulatory Visit (HOSPITAL_COMMUNITY): Payer: Self-pay

## 2022-01-28 ENCOUNTER — Other Ambulatory Visit (HOSPITAL_COMMUNITY): Payer: Self-pay

## 2022-01-30 ENCOUNTER — Encounter (HOSPITAL_COMMUNITY): Payer: Self-pay | Admitting: Emergency Medicine

## 2022-01-30 ENCOUNTER — Ambulatory Visit (HOSPITAL_COMMUNITY)
Admission: EM | Admit: 2022-01-30 | Discharge: 2022-01-30 | Disposition: A | Discharge status: Home / Self Care | Payer: Medicare Other | Attending: Internal Medicine | Admitting: Internal Medicine

## 2022-01-30 DIAGNOSIS — Z1152 Encounter for screening for COVID-19: Secondary | ICD-10-CM | POA: Diagnosis present

## 2022-01-30 DIAGNOSIS — J3489 Other specified disorders of nose and nasal sinuses: Secondary | ICD-10-CM | POA: Insufficient documentation

## 2022-01-30 NOTE — ED Triage Notes (Signed)
Pt reports has nasal/sinus endoscopy surgery on 12/1 and concerned since having runny nose since Wed when had eye drops at doctor appt that caused dry eyes.
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
## 2022-01-30 NOTE — Discharge Instructions (Addendum)
Take Allegra from home supply as discussed in clinic. This will help to dry up your nasal mucus ahead of your surgery.  COVID-19 testing is pending and will come back in the next 12 to 24 hours.  We will call you if your COVID test is positive.  Call [facility] surgery tomorrow morning to discuss your symptoms for further recommendations.   Return to urgent care as needed. Good luck with your surgery on Friday! :)

## 2022-01-31 ENCOUNTER — Other Ambulatory Visit (HOSPITAL_COMMUNITY): Payer: Self-pay

## 2022-01-31 LAB — SARS CORONAVIRUS 2 (TAT 6-24 HRS): SARS Coronavirus 2: NEGATIVE

## 2022-01-31 MED ORDER — MELOXICAM 7.5 MG PO TABS
7.5000 mg | ORAL_TABLET | Freq: Two times a day (BID) | ORAL | 0 refills | Status: DC | PRN
  Filled 2022-01-31: qty 180, 90d supply, fill #0

## 2022-02-02 ENCOUNTER — Other Ambulatory Visit (HOSPITAL_COMMUNITY): Payer: Self-pay

## 2022-02-02 MED ORDER — CEFUROXIME AXETIL 250 MG PO TABS
250.0000 mg | ORAL_TABLET | Freq: Two times a day (BID) | ORAL | 0 refills | Status: DC
  Filled 2022-02-02 (×2): qty 10, 5d supply, fill #0

## 2022-02-03 ENCOUNTER — Other Ambulatory Visit (HOSPITAL_COMMUNITY): Payer: Self-pay

## 2022-02-04 ENCOUNTER — Other Ambulatory Visit (HOSPITAL_COMMUNITY): Payer: Self-pay

## 2022-02-04 MED ORDER — IBUPROFEN 600 MG PO TABS
600.0000 mg | ORAL_TABLET | Freq: Four times a day (QID) | ORAL | 0 refills | Status: DC | PRN
  Filled 2022-02-04: qty 30, 8d supply, fill #0

## 2022-02-04 MED ORDER — SALINE NASAL SPRAY 0.65 % NA SOLN: 2.0000 | Freq: Two times a day (BID) | NASAL | 0 refills | Status: DC

## 2022-02-05 ENCOUNTER — Other Ambulatory Visit (HOSPITAL_COMMUNITY): Payer: Self-pay

## 2022-02-07 ENCOUNTER — Other Ambulatory Visit (HOSPITAL_COMMUNITY): Payer: Self-pay

## 2022-02-07 MED ORDER — FOLIC ACID 1 MG PO TABS
1.0000 mg | ORAL_TABLET | Freq: Every day | ORAL | 3 refills | Status: DC
  Filled 2022-02-07: qty 90, 90d supply, fill #0
  Filled 2022-04-26: qty 90, 90d supply, fill #1
  Filled 2022-07-22: qty 90, 90d supply, fill #2
  Filled 2022-10-21: qty 90, 90d supply, fill #3

## 2022-02-08 ENCOUNTER — Other Ambulatory Visit (HOSPITAL_COMMUNITY): Payer: Self-pay

## 2022-02-08 ENCOUNTER — Ambulatory Visit: Payer: Medicare Other | Admitting: Neurology

## 2022-02-09 ENCOUNTER — Other Ambulatory Visit (HOSPITAL_COMMUNITY): Payer: Self-pay

## 2022-02-09 ENCOUNTER — Ambulatory Visit: Payer: Medicare Other | Admitting: Neurology

## 2022-02-10 ENCOUNTER — Other Ambulatory Visit (HOSPITAL_COMMUNITY): Payer: Self-pay

## 2022-02-10 MED ORDER — MELOXICAM 7.5 MG PO TABS
7.5000 mg | ORAL_TABLET | Freq: Two times a day (BID) | ORAL | 0 refills | Status: DC | PRN
  Filled 2022-02-10: qty 180, 90d supply, fill #0

## 2022-02-11 ENCOUNTER — Other Ambulatory Visit (HOSPITAL_COMMUNITY): Payer: Self-pay

## 2022-02-12 ENCOUNTER — Other Ambulatory Visit (HOSPITAL_COMMUNITY): Payer: Self-pay

## 2022-02-17 ENCOUNTER — Other Ambulatory Visit (HOSPITAL_COMMUNITY): Payer: Self-pay

## 2022-02-17 MED ORDER — IBUPROFEN 600 MG PO TABS
ORAL_TABLET | ORAL | 2 refills | Status: DC
  Filled 2022-02-17: qty 60, 30d supply, fill #0
  Filled 2022-03-01 – 2022-03-11 (×2): qty 60, 30d supply, fill #1
  Filled 2022-04-14: qty 60, 30d supply, fill #2

## 2022-02-24 ENCOUNTER — Other Ambulatory Visit (HOSPITAL_COMMUNITY): Payer: Self-pay

## 2022-02-24 MED ORDER — AMLODIPINE BESYLATE 2.5 MG PO TABS
ORAL_TABLET | ORAL | 3 refills | Status: DC
  Filled 2022-02-24: qty 90, 90d supply, fill #0
  Filled 2022-04-26 – 2022-05-13 (×2): qty 90, 90d supply, fill #1
  Filled 2022-07-22 – 2022-08-12 (×2): qty 90, 90d supply, fill #2
  Filled 2022-11-16: qty 90, 90d supply, fill #3

## 2022-02-24 MED ORDER — LOSARTAN POTASSIUM 50 MG PO TABS
50.0000 mg | ORAL_TABLET | Freq: Two times a day (BID) | ORAL | 3 refills | Status: DC
  Filled 2022-02-24: qty 180, 90d supply, fill #0
  Filled 2022-04-26 – 2022-05-13 (×2): qty 180, 90d supply, fill #1
  Filled 2022-07-22 – 2022-08-20 (×3): qty 180, 90d supply, fill #2
  Filled 2022-11-16: qty 180, 90d supply, fill #3

## 2022-03-01 ENCOUNTER — Other Ambulatory Visit (HOSPITAL_COMMUNITY): Payer: Self-pay

## 2022-03-01 ENCOUNTER — Other Ambulatory Visit: Payer: Self-pay

## 2022-03-02 ENCOUNTER — Other Ambulatory Visit: Payer: Self-pay

## 2022-03-03 ENCOUNTER — Other Ambulatory Visit (HOSPITAL_COMMUNITY): Payer: Self-pay

## 2022-03-04 ENCOUNTER — Other Ambulatory Visit (HOSPITAL_COMMUNITY): Payer: Self-pay

## 2022-03-04 MED ORDER — ZOLPIDEM TARTRATE 5 MG PO TABS
5.0000 mg | ORAL_TABLET | Freq: Every day | ORAL | 1 refills | Status: DC
  Filled 2022-03-09 – 2022-04-14 (×5): qty 135, 67d supply, fill #0
  Filled 2022-06-04 – 2022-07-22 (×2): qty 135, 67d supply, fill #1

## 2022-03-09 ENCOUNTER — Other Ambulatory Visit (HOSPITAL_COMMUNITY): Payer: Self-pay

## 2022-03-12 ENCOUNTER — Other Ambulatory Visit (HOSPITAL_COMMUNITY): Payer: Self-pay

## 2022-03-14 ENCOUNTER — Other Ambulatory Visit (HOSPITAL_COMMUNITY): Payer: Self-pay

## 2022-03-14 ENCOUNTER — Other Ambulatory Visit: Payer: Self-pay

## 2022-03-15 ENCOUNTER — Other Ambulatory Visit (HOSPITAL_COMMUNITY): Payer: Self-pay

## 2022-03-24 ENCOUNTER — Other Ambulatory Visit (HOSPITAL_COMMUNITY): Payer: Self-pay

## 2022-04-14 ENCOUNTER — Other Ambulatory Visit (HOSPITAL_COMMUNITY): Payer: Self-pay

## 2022-04-14 ENCOUNTER — Other Ambulatory Visit: Payer: Self-pay

## 2022-04-14 MED ORDER — IBUPROFEN 600 MG PO TABS
600.0000 mg | ORAL_TABLET | Freq: Two times a day (BID) | ORAL | 3 refills | Status: DC | PRN
  Filled 2022-04-14 – 2022-05-14 (×4): qty 180, 90d supply, fill #0
  Filled 2022-07-22 – 2022-07-28 (×2): qty 180, 90d supply, fill #1
  Filled 2022-10-21: qty 180, 90d supply, fill #2
  Filled 2023-01-17: qty 180, 90d supply, fill #3

## 2022-04-26 ENCOUNTER — Other Ambulatory Visit: Payer: Self-pay

## 2022-04-26 ENCOUNTER — Other Ambulatory Visit (HOSPITAL_COMMUNITY): Payer: Self-pay

## 2022-05-14 ENCOUNTER — Other Ambulatory Visit (HOSPITAL_COMMUNITY): Payer: Self-pay

## 2022-05-16 ENCOUNTER — Other Ambulatory Visit: Payer: Self-pay

## 2022-05-16 ENCOUNTER — Other Ambulatory Visit (HOSPITAL_COMMUNITY): Payer: Self-pay

## 2022-05-16 MED ORDER — BUPROPION HCL ER (XL) 300 MG PO TB24
300.0000 mg | ORAL_TABLET | Freq: Every day | ORAL | 0 refills | Status: DC
  Filled 2022-06-04 – 2022-11-16 (×2): qty 90, 90d supply, fill #0

## 2022-05-27 ENCOUNTER — Other Ambulatory Visit (HOSPITAL_COMMUNITY): Payer: Self-pay

## 2022-05-27 MED ORDER — BUPROPION HCL ER (XL) 300 MG PO TB24
300.0000 mg | ORAL_TABLET | Freq: Every day | ORAL | 0 refills | Status: DC
  Filled 2022-05-27: qty 90, 90d supply, fill #0

## 2022-06-05 ENCOUNTER — Other Ambulatory Visit (HOSPITAL_COMMUNITY): Payer: Self-pay

## 2022-06-06 ENCOUNTER — Other Ambulatory Visit: Payer: Self-pay

## 2022-06-07 ENCOUNTER — Other Ambulatory Visit (HOSPITAL_COMMUNITY): Payer: Self-pay

## 2022-06-21 ENCOUNTER — Other Ambulatory Visit (HOSPITAL_COMMUNITY): Payer: Self-pay

## 2022-06-21 MED ORDER — BROMFENAC SODIUM 0.07 % OP SOLN
1.0000 [drp] | Freq: Three times a day (TID) | OPHTHALMIC | 0 refills | Status: DC
  Filled 2022-06-21: qty 3, 20d supply, fill #0

## 2022-07-01 ENCOUNTER — Other Ambulatory Visit (HOSPITAL_COMMUNITY): Payer: Self-pay
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
## 2022-07-22 ENCOUNTER — Encounter: Payer: Self-pay | Admitting: Neurology

## 2022-07-22 ENCOUNTER — Ambulatory Visit (INDEPENDENT_AMBULATORY_CARE_PROVIDER_SITE_OTHER): Payer: Medicare Other | Admitting: Neurology

## 2022-07-22 VITALS — BP 139/70 | HR 62 | Ht 63.5 in | Wt 133.0 lb

## 2022-07-22 DIAGNOSIS — M48061 Spinal stenosis, lumbar region without neurogenic claudication: Secondary | ICD-10-CM | POA: Diagnosis not present

## 2022-07-22 DIAGNOSIS — M5417 Radiculopathy, lumbosacral region: Secondary | ICD-10-CM

## 2022-07-22 DIAGNOSIS — M545 Low back pain, unspecified: Secondary | ICD-10-CM | POA: Diagnosis not present

## 2022-07-22 DIAGNOSIS — R209 Unspecified disturbances of skin sensation: Secondary | ICD-10-CM

## 2022-07-22 DIAGNOSIS — N3942 Incontinence without sensory awareness: Secondary | ICD-10-CM

## 2022-07-22 DIAGNOSIS — E538 Deficiency of other specified B group vitamins: Secondary | ICD-10-CM | POA: Diagnosis not present

## 2022-07-22 DIAGNOSIS — G8929 Other chronic pain: Secondary | ICD-10-CM

## 2022-07-22 NOTE — Patient Instructions (Addendum)
Continue PT and follow up with spine medicine as planned.  I will see you back in clinic in 1 year or sooner if needed.  The physicians and staff at [facility] are committed to providing excellent care. You may receive a survey requesting feedback about your experience at our office. We strive to receive "very good" responses to the survey questions. If you feel that your experience would prevent you from giving the office a "very good " response, please contact our office to try to remedy the situation. We may be reached at [phone]. Thank you for taking the time out of your busy day to complete the survey.  Jacquelyne Balint, MD [facility]  Preventing Falls at [facility] are common, often dreaded events in the lives of older people. Aside from the obvious injuries and even death that may result, fall can cause wide-ranging consequences including loss of independence, mental decline, decreased activity and mobility. Younger people are also at risk of falling, especially those with chronic illnesses and fatigue.  Ways to reduce risk for falling Examine diet and medications. Warm foods and alcohol dilate blood vessels, which can lead to dizziness when standing. Sleep aids, antidepressants and pain medications can also increase the likelihood of a fall.  Get a vision exam. Poor vision, cataracts and glaucoma increase the chances of falling.  Check foot gear. Shoes should fit snugly and have a sturdy, nonskid sole and a broad, low heel  Participate in a physician-approved exercise program to build and maintain muscle strength and improve balance and coordination. Programs that use ankle weights or stretch bands are excellent for muscle-strengthening. Water aerobics programs and low-impact Tai Chi programs have also been shown to improve balance and coordination.  Increase vitamin D intake. Vitamin D improves muscle strength and increases the amount of calcium the body is able to  absorb and deposit in bones.  How to prevent falls from common hazards Floors - Remove all loose wires, cords, and throw rugs. Minimize clutter. Make sure rugs are anchored and smooth. Keep furniture in its usual place.  Chairs -- Use chairs with straight backs, armrests and firm seats. Add firm cushions to existing pieces to add height.  Bathroom - Install grab bars and non-skid tape in the tub or shower. Use a bathtub transfer bench or a shower chair with a back support Use an elevated toilet seat and/or safety rails to assist standing from a low surface. Do not use towel racks or bathroom tissue holders to help you stand.  Lighting - Make sure halls, stairways, and entrances are well-lit. Install a night light in your bathroom or hallway. Make sure there is a light switch at the top and bottom of the staircase. Turn lights on if you get up in the middle of the night. Make sure lamps or light switches are within reach of the bed if you have to get up during the night.  Kitchen - Install non-skid rubber mats near the sink and stove. Clean spills immediately. Store frequently used utensils, pots, pans between waist and eye level. This helps prevent reaching and bending. Sit when getting things out of lower cupboards.  Living room/ Bedrooms - Place furniture with wide spaces in between, giving enough room to move around. Establish a route through the living room that gives you something to hold onto as you walk.  Stairs - Make sure treads, rails, and rugs are secure. Install a rail on both sides of the stairs. If stairs are  a threat, it might be helpful to arrange most of your activities on the lower level to reduce the number of times you must climb the stairs.  Entrances and doorways - Install metal handles on the walls adjacent to the doorknobs of all doors to make it more secure as you travel through the doorway.  Tips for maintaining balance Keep at least one hand free at all times. Try using  a backpack or fanny pack to hold things rather than carrying them in your hands. Never carry objects in both hands when walking as this interferes with keeping your balance.  Attempt to swing both arms from front to back while walking. This might require a conscious effort if Parkinson's disease has diminished your movement. It will, however, help you to maintain balance and posture, and reduce fatigue.  Consciously lift your feet off of the ground when walking. Shuffling and dragging of the feet is a common culprit in losing your balance.  When trying to navigate turns, use a "U" technique of facing forward and making a wide turn, rather than pivoting sharply.  Try to stand with your feet shoulder-length apart. When your feet are close together for any length of time, you increase your risk of losing your balance and falling.  Do one thing at a time. Don't try to walk and accomplish another task, such as reading or looking around. The decrease in your automatic reflexes complicates motor function, so the less distraction, the better.  Do not wear rubber or gripping soled shoes, they might "catch" on the floor and cause tripping.  Move slowly when changing positions. Use deliberate, concentrated movements and, if needed, use a grab bar or walking aid. Count 15 seconds between each movement. For example, when rising from a seated position, wait 15 seconds after standing to begin walking.  If balance is a continuous problem, you might want to consider a walking aid such as a cane, walking stick, or walker. Once you've mastered walking with help, you might be ready to try it on your own again.

## 2022-07-22 NOTE — Addendum Note (Signed)
Addended by: Antony Madura on: 07/22/2022 09:20 AM   Modules accepted: Orders

## 2022-07-23 ENCOUNTER — Other Ambulatory Visit (HOSPITAL_COMMUNITY): Payer: Self-pay

## 2022-07-25 ENCOUNTER — Other Ambulatory Visit (HOSPITAL_COMMUNITY): Payer: Self-pay

## 2022-07-25 ENCOUNTER — Other Ambulatory Visit: Payer: Self-pay

## 2022-07-28 ENCOUNTER — Other Ambulatory Visit (HOSPITAL_COMMUNITY): Payer: Self-pay

## 2022-07-29 ENCOUNTER — Other Ambulatory Visit (HOSPITAL_COMMUNITY): Payer: Self-pay

## 2022-08-20 ENCOUNTER — Other Ambulatory Visit (HOSPITAL_COMMUNITY): Payer: Self-pay

## 2022-09-12 ENCOUNTER — Other Ambulatory Visit (HOSPITAL_COMMUNITY): Payer: Self-pay

## 2022-09-12 MED ORDER — NITROFURANTOIN MONOHYD MACRO 100 MG PO CAPS
100.0000 mg | ORAL_CAPSULE | Freq: Two times a day (BID) | ORAL | 0 refills | Status: DC
  Filled 2022-09-12: qty 14, 7d supply, fill #0

## 2022-10-02 ENCOUNTER — Other Ambulatory Visit (HOSPITAL_COMMUNITY): Payer: Self-pay

## 2022-10-03 ENCOUNTER — Other Ambulatory Visit (HOSPITAL_COMMUNITY): Payer: Self-pay

## 2022-10-03 MED ORDER — ZOLPIDEM TARTRATE 5 MG PO TABS
5.0000 mg | ORAL_TABLET | Freq: Every day | ORAL | 1 refills | Status: DC
  Filled 2022-10-03: qty 135, 67d supply, fill #0
  Filled 2022-12-02: qty 135, 67d supply, fill #1

## 2022-10-03 MED ORDER — TOBRAMYCIN 0.3 % OP SOLN
OPHTHALMIC | 5 refills | Status: DC
  Filled 2022-10-03: qty 5, 25d supply, fill #0
  Filled 2022-10-21: qty 5, 25d supply, fill #1
  Filled 2022-11-16: qty 5, 25d supply, fill #2
  Filled 2022-12-13: qty 5, 25d supply, fill #3
  Filled 2023-01-21: qty 5, 25d supply, fill #4
  Filled 2023-02-10: qty 5, 25d supply, fill #5

## 2022-10-04 ENCOUNTER — Other Ambulatory Visit: Payer: Self-pay

## 2022-10-04 ENCOUNTER — Other Ambulatory Visit (HOSPITAL_COMMUNITY): Payer: Self-pay

## 2022-10-21 ENCOUNTER — Other Ambulatory Visit: Payer: Self-pay

## 2022-10-24 ENCOUNTER — Other Ambulatory Visit (HOSPITAL_COMMUNITY): Payer: Self-pay

## 2022-11-02 ENCOUNTER — Other Ambulatory Visit: Payer: Self-pay | Admitting: Family Medicine

## 2022-11-02 DIAGNOSIS — Z1231 Encounter for screening mammogram for malignant neoplasm of breast: Secondary | ICD-10-CM

## 2022-11-16 ENCOUNTER — Other Ambulatory Visit (HOSPITAL_COMMUNITY): Payer: Self-pay

## 2022-11-17 ENCOUNTER — Other Ambulatory Visit (HOSPITAL_COMMUNITY): Payer: Self-pay

## 2022-11-17 MED ORDER — ALPRAZOLAM 1 MG PO TABS
ORAL_TABLET | ORAL | 0 refills | Status: DC
  Filled 2022-11-17: qty 1, 1d supply, fill #0

## 2022-11-18 ENCOUNTER — Other Ambulatory Visit (HOSPITAL_COMMUNITY): Payer: Self-pay

## 2022-11-21 ENCOUNTER — Other Ambulatory Visit (HOSPITAL_COMMUNITY): Payer: Self-pay

## 2022-12-05 ENCOUNTER — Other Ambulatory Visit (HOSPITAL_COMMUNITY): Payer: Self-pay

## 2022-12-05 ENCOUNTER — Other Ambulatory Visit: Payer: Self-pay

## 2022-12-05 MED ORDER — ALPRAZOLAM 1 MG PO TABS
ORAL_TABLET | ORAL | 0 refills | Status: DC
  Filled 2022-12-05: qty 1, 1d supply, fill #0

## 2022-12-06 ENCOUNTER — Other Ambulatory Visit (HOSPITAL_COMMUNITY): Payer: Self-pay

## 2022-12-09 ENCOUNTER — Ambulatory Visit: Payer: Medicare Other

## 2022-12-17 ENCOUNTER — Ambulatory Visit: Payer: Medicare Other

## 2022-12-20 ENCOUNTER — Other Ambulatory Visit (HOSPITAL_COMMUNITY): Payer: Self-pay

## 2022-12-20 MED ORDER — ESTRADIOL 0.1 MG/GM VA CREA
0.5000 g | TOPICAL_CREAM | VAGINAL | 1 refills | Status: DC
  Filled 2022-12-23: qty 42.5, 90d supply, fill #0
  Filled 2023-03-16: qty 42.5, 90d supply, fill #1

## 2022-12-20 MED ORDER — NITROGLYCERIN 0.4 MG SL SUBL
0.4000 mg | SUBLINGUAL_TABLET | SUBLINGUAL | 1 refills | Status: DC | PRN
  Filled 2022-12-20: qty 25, 8d supply, fill #0
  Filled 2023-06-21: qty 25, 8d supply, fill #1

## 2022-12-21 ENCOUNTER — Other Ambulatory Visit (HOSPITAL_COMMUNITY): Payer: Self-pay

## 2022-12-21 ENCOUNTER — Ambulatory Visit
Admission: RE | Admit: 2022-12-21 | Discharge: 2022-12-21 | Disposition: A | Discharge status: Home / Self Care | Payer: Medicare Other | Source: Ambulatory Visit | Attending: Family Medicine | Admitting: Family Medicine

## 2022-12-21 DIAGNOSIS — Z1231 Encounter for screening mammogram for malignant neoplasm of breast: Secondary | ICD-10-CM

## 2022-12-22 ENCOUNTER — Other Ambulatory Visit (HOSPITAL_COMMUNITY): Payer: Self-pay

## 2022-12-23 ENCOUNTER — Other Ambulatory Visit (HOSPITAL_COMMUNITY): Payer: Self-pay

## 2022-12-23 MED ORDER — COVID-19 MRNA VAC-TRIS(PFIZER) 30 MCG/0.3ML IM SUSY
0.3000 mL | PREFILLED_SYRINGE | Freq: Once | INTRAMUSCULAR | 0 refills | Status: AC
Start: 2022-12-23 — End: 2022-12-23
  Filled 2022-12-23: qty 0.3, 1d supply, fill #0

## 2022-12-24 ENCOUNTER — Other Ambulatory Visit (HOSPITAL_COMMUNITY): Payer: Self-pay

## 2022-12-30 ENCOUNTER — Ambulatory Visit: Payer: Medicare Other

## 2023-01-17 ENCOUNTER — Other Ambulatory Visit (HOSPITAL_COMMUNITY): Payer: Self-pay

## 2023-01-18 ENCOUNTER — Other Ambulatory Visit (HOSPITAL_COMMUNITY): Payer: Self-pay

## 2023-01-18 ENCOUNTER — Other Ambulatory Visit: Payer: Self-pay

## 2023-01-19 ENCOUNTER — Other Ambulatory Visit (HOSPITAL_COMMUNITY): Payer: Self-pay

## 2023-01-19 MED ORDER — AMLODIPINE BESYLATE 2.5 MG PO TABS
2.5000 mg | ORAL_TABLET | Freq: Every day | ORAL | 4 refills | Status: DC
  Filled 2023-01-19: qty 90, 90d supply, fill #0
  Filled 2023-04-17: qty 90, 90d supply, fill #1
  Filled 2023-06-29: qty 90, 90d supply, fill #2
  Filled 2023-10-13: qty 90, 90d supply, fill #3
  Filled 2024-01-09: qty 90, 90d supply, fill #4

## 2023-01-19 MED ORDER — LOSARTAN POTASSIUM 50 MG PO TABS
50.0000 mg | ORAL_TABLET | Freq: Two times a day (BID) | ORAL | 4 refills | Status: DC
  Filled 2023-01-19 – 2023-02-05 (×2): qty 180, 90d supply, fill #0

## 2023-01-19 MED ORDER — FOLIC ACID 1 MG PO TABS
1.0000 mg | ORAL_TABLET | Freq: Every day | ORAL | 4 refills | Status: DC
  Filled 2023-01-19: qty 90, 90d supply, fill #0
  Filled 2023-04-17: qty 90, 90d supply, fill #1
  Filled 2023-06-29: qty 90, 90d supply, fill #2
  Filled 2023-10-13: qty 90, 90d supply, fill #3
  Filled 2024-01-09: qty 90, 90d supply, fill #4

## 2023-01-19 MED ORDER — ZOLPIDEM TARTRATE 5 MG PO TABS
5.0000 mg | ORAL_TABLET | Freq: Every day | ORAL | 4 refills | Status: DC
  Filled 2023-01-19 – 2023-02-05 (×2): qty 135, 67d supply, fill #0
  Filled 2023-05-16: qty 135, 67d supply, fill #1
  Filled 2023-06-29 – 2023-07-11 (×2): qty 135, 67d supply, fill #2

## 2023-01-19 MED ORDER — ATORVASTATIN CALCIUM 10 MG PO TABS
10.0000 mg | ORAL_TABLET | Freq: Every day | ORAL | 4 refills | Status: DC
  Filled 2023-01-19: qty 90, 90d supply, fill #0
  Filled 2023-04-17: qty 90, 90d supply, fill #1
  Filled 2023-06-29: qty 90, 90d supply, fill #2
  Filled 2023-10-13: qty 90, 90d supply, fill #3
  Filled 2024-01-09: qty 90, 90d supply, fill #4

## 2023-01-19 MED ORDER — BUPROPION HCL ER (XL) 150 MG PO TB24
150.0000 mg | ORAL_TABLET | Freq: Every day | ORAL | 4 refills | Status: DC
  Filled 2023-01-19: qty 90, 90d supply, fill #0
  Filled 2023-04-17: qty 90, 90d supply, fill #1
  Filled 2023-06-29: qty 90, 90d supply, fill #2
  Filled 2023-10-13: qty 90, 90d supply, fill #3
  Filled 2024-01-09: qty 90, 90d supply, fill #4

## 2023-01-19 MED ORDER — METOPROLOL SUCCINATE ER 50 MG PO TB24
50.0000 mg | ORAL_TABLET | Freq: Two times a day (BID) | ORAL | 4 refills | Status: DC
  Filled 2023-01-19: qty 200, 100d supply, fill #0
  Filled 2023-02-22: qty 180, 90d supply, fill #0

## 2023-01-20 ENCOUNTER — Other Ambulatory Visit (HOSPITAL_COMMUNITY): Payer: Self-pay

## 2023-01-20 MED ORDER — CLOBETASOL PROPIONATE 0.05 % EX OINT
TOPICAL_OINTMENT | CUTANEOUS | 3 refills | Status: DC
  Filled 2023-01-20: qty 45, 30d supply, fill #0
  Filled 2023-02-13: qty 45, 30d supply, fill #1
  Filled 2023-03-14: qty 45, 30d supply, fill #2
  Filled 2023-04-13: qty 45, 30d supply, fill #3

## 2023-01-20 MED ORDER — ESTRADIOL 0.1 MG/GM VA CREA
TOPICAL_CREAM | VAGINAL | 3 refills | Status: DC
  Filled 2023-01-20: qty 42.5, 99d supply, fill #0
  Filled 2023-01-21 – 2023-01-24 (×3): qty 85, fill #0
  Filled 2023-01-25: qty 42.5, 30d supply, fill #0
  Filled 2023-02-05 – 2023-02-07 (×2): qty 42.5, 90d supply, fill #0
  Filled 2023-02-22: qty 42.5, 30d supply, fill #0
  Filled 2023-05-18: qty 42.5, 30d supply, fill #1
  Filled 2023-06-01 – 2023-06-13 (×2): qty 42.5, 30d supply, fill #2
  Filled 2023-07-11 – 2023-07-24 (×4): qty 42.5, 30d supply, fill #3

## 2023-01-21 ENCOUNTER — Other Ambulatory Visit (HOSPITAL_COMMUNITY): Payer: Self-pay

## 2023-01-23 ENCOUNTER — Other Ambulatory Visit: Payer: Self-pay

## 2023-01-23 ENCOUNTER — Other Ambulatory Visit (HOSPITAL_COMMUNITY): Payer: Self-pay

## 2023-01-24 ENCOUNTER — Other Ambulatory Visit: Payer: Self-pay

## 2023-01-24 ENCOUNTER — Other Ambulatory Visit (HOSPITAL_COMMUNITY): Payer: Self-pay

## 2023-01-25 ENCOUNTER — Other Ambulatory Visit (HOSPITAL_COMMUNITY): Payer: Self-pay

## 2023-02-05 ENCOUNTER — Other Ambulatory Visit (HOSPITAL_COMMUNITY): Payer: Self-pay

## 2023-02-06 ENCOUNTER — Ambulatory Visit (INDEPENDENT_AMBULATORY_CARE_PROVIDER_SITE_OTHER): Payer: Medicare Other | Admitting: Podiatry

## 2023-02-06 ENCOUNTER — Other Ambulatory Visit: Payer: Self-pay

## 2023-02-06 ENCOUNTER — Encounter: Payer: Self-pay | Admitting: Podiatry

## 2023-02-06 ENCOUNTER — Other Ambulatory Visit (HOSPITAL_COMMUNITY): Payer: Self-pay

## 2023-02-06 VITALS — Ht 63.5 in | Wt 133.0 lb

## 2023-02-06 DIAGNOSIS — L600 Ingrowing nail: Secondary | ICD-10-CM | POA: Diagnosis not present

## 2023-02-06 NOTE — Progress Notes (Unsigned)
       Subjective:  Patient ID: Kaylee Weiss, female    DOB: 1946/01/09,  MRN: [id number]  Kaylee Weiss presents to clinic today for:  Chief Complaint  Patient presents with   Ingrown Toenail    Pt is here to be been seen about a possible ingrown on left greater toenail.   Patient presents with pain in left hallux toenail medial border.  States this has been an ongoing issue and is getting worse.  She is requesting that a procedure be performed that she is having in the past by other providers within our practice.  She does note that she typically gets recurrence of the nail and nail and is permanently.  Denies any recent drainage  PCP is Ethelda Chick, MD.  Allergies  Allergen Reactions   Demerol [Meperidine] Anaphylaxis   Gabapentin Other (See Comments)    Fatigue and foggy headed   Codeine Nausea And Vomiting   Hydralazine Other (See Comments)   Other Other (See Comments)    Suicidal ideation Alcohol (drinking kind); takes liver enzymes and elevates them   Percocet [Oxycodone-Acetaminophen] Nausea And Vomiting    Review of Systems: Negative except as noted in the HPI.  Objective:  Kaylee Weiss is a pleasant 77 y.o. female in NAD. AAO x 3.  Vascular Examination: Capillary refill time is 3-5 seconds to toes bilateral. Palpable pedal pulses b/l LE. Digital hair present b/l. No pedal edema b/l. Skin temperature gradient WNL b/l. No varicosities b/l. No cyanosis or clubbing noted b/l.   Dermatological Examination: There is incurvation of the left hallux medial nail border.  There is pain on palpation of the affected nail border.  No active drainage is noted.    Neurological Examination: Epicritic sensation is intact bilateral  Assessment/Plan: 1. Ingrown toenail     Discussed patient's condition today.  After obtaining patient consent, the left hallux was anesthetized with a 50:50 mixture of 1% lidocaine plain and 0.5% bupivacaine plain for a total of 3cc's  administered.  Upon confirmation of anesthesia, a freer elevator was utilized to free the left hallux medial nail border from the nail bed.  The nail border was then avulsed proximal to the eponychium and removed in toto.  The area was inspected for any remaining spicules.  A chemical matrixectomy was performed with NaOH and neutralized with acetic acid solution.  Antibiotic ointment and a DSD were applied, followed by a Coban dressing.  Patient tolerated the anesthetic and procedure well and will f/u in 2-3 weeks for recheck.  Patient given post-procedure instructions for daily 15-minute Epsom salt soaks, antibiotic ointment and daily use of Bandaids until toe starts to dry / form eschar.    Return in about 2 weeks (around 02/20/2023) for PNA recheck.   Clerance Lav, DPM, FACFAS [facility]     [address] [place], [place] [address]                Office [phone]  Fax [phone]

## 2023-02-06 NOTE — Patient Instructions (Signed)

## 2023-02-07 ENCOUNTER — Other Ambulatory Visit: Payer: Self-pay

## 2023-02-07 ENCOUNTER — Other Ambulatory Visit (HOSPITAL_COMMUNITY): Payer: Self-pay

## 2023-02-07 MED ORDER — XIIDRA 5 % OP SOLN
1.0000 [drp] | Freq: Two times a day (BID) | OPHTHALMIC | 4 refills | Status: DC
  Filled 2023-02-07: qty 180, 90d supply, fill #0
  Filled 2023-02-07: qty 60, 30d supply, fill #0
  Filled 2023-02-22 – 2023-02-27 (×3): qty 180, 90d supply, fill #1

## 2023-02-10 ENCOUNTER — Other Ambulatory Visit: Payer: Self-pay

## 2023-02-13 ENCOUNTER — Other Ambulatory Visit: Payer: Self-pay

## 2023-02-13 ENCOUNTER — Other Ambulatory Visit (HOSPITAL_COMMUNITY): Payer: Self-pay

## 2023-02-14 ENCOUNTER — Other Ambulatory Visit (HOSPITAL_COMMUNITY): Payer: Self-pay

## 2023-02-20 ENCOUNTER — Ambulatory Visit (INDEPENDENT_AMBULATORY_CARE_PROVIDER_SITE_OTHER): Payer: Medicare Other | Admitting: Podiatry

## 2023-02-20 ENCOUNTER — Encounter: Payer: Self-pay | Admitting: Podiatry

## 2023-02-20 DIAGNOSIS — L600 Ingrowing nail: Secondary | ICD-10-CM | POA: Diagnosis not present

## 2023-02-20 NOTE — Progress Notes (Signed)
       Subjective:  Patient ID: Kaylee Weiss, female    DOB: [birth year],  MRN: [id number]  Chief Complaint  Patient presents with   Ingrown Toenail   KIAJA DENVER presents to clinic today for f/u of PNA to the left hallux medial nail border.  She notes minimal to no discomfort at this time.  There is a small spot that occasionally drains.  She will occasionally wrap it with gauze instead of a regular Band-Aid stating this will sometimes irritate her skin.  Overall the patient states that she is pleased with her procedure and experience  PCP is Katrinka Blazing, Myrle Sheng, MD.  Allergies  Allergen Reactions   Demerol [Meperidine] Anaphylaxis   Gabapentin Other (See Comments)    Fatigue and foggy headed   Codeine Nausea And Vomiting   Hydralazine Other (See Comments)   Other Other (See Comments)    Suicidal ideation Alcohol (drinking kind); takes liver enzymes and elevates them   Percocet [Oxycodone-Acetaminophen] Nausea And Vomiting    Objective:  Vascular Examination: Capillary refill time is 3-5 seconds to toes bilateral. Palpable pedal pulses b/l LE. Digital hair present b/l. No pedal edema b/l. Skin temperature gradient WNL b/l. No varicosities b/l. No cyanosis or clubbing noted b/l.   Dermatological Examination: Upon inspection of the PNA site, there are no clinical signs of infection.  No purulence, no necrosis, no malodor present.  Minimal to no erythema present due to sodium hydroxide chemical reaction.  There is a small superficial hematoma less than 3 mm in diameter near the proximal nail fold where the chemical matrixectomy was performed.  This is most likely secondary to the chemical reaction.  No clinical signs of infection are noted  Assessment/Plan: 1. Ingrown toenail    Small amount of iodine and a Telfa pad with a light bandage was applied to the toe today.  She can apply iodine to the area at home to get the spot to dry out quicker.  Once this has formed a scab and dried  out, she can discontinue all instructions.  Return if symptoms worsen or fail to improve.   Clerance Lav, DPM, FACFAS [facility]     [address] [place], [place] [address]                Office [phone]  Fax [phone]

## 2023-02-22 ENCOUNTER — Other Ambulatory Visit (HOSPITAL_COMMUNITY): Payer: Self-pay

## 2023-02-22 ENCOUNTER — Other Ambulatory Visit: Payer: Self-pay

## 2023-02-22 MED ORDER — VALACYCLOVIR HCL 500 MG PO TABS
500.0000 mg | ORAL_TABLET | Freq: Two times a day (BID) | ORAL | 3 refills | Status: AC
  Filled 2023-02-22: qty 180, 90d supply, fill #0
  Filled 2023-06-01 – 2023-08-01 (×2): qty 180, 90d supply, fill #1
  Filled 2023-10-31: qty 180, 90d supply, fill #2
  Filled 2024-02-07: qty 180, 90d supply, fill #3

## 2023-02-27 ENCOUNTER — Other Ambulatory Visit: Payer: Self-pay

## 2023-02-27 ENCOUNTER — Other Ambulatory Visit (HOSPITAL_COMMUNITY): Payer: Self-pay

## 2023-02-28 ENCOUNTER — Other Ambulatory Visit (HOSPITAL_COMMUNITY): Payer: Self-pay

## 2023-02-28 ENCOUNTER — Other Ambulatory Visit: Payer: Self-pay

## 2023-03-09 ENCOUNTER — Other Ambulatory Visit (HOSPITAL_COMMUNITY): Payer: Self-pay

## 2023-03-17 ENCOUNTER — Other Ambulatory Visit (HOSPITAL_COMMUNITY): Payer: Self-pay

## 2023-03-17 MED ORDER — TOBRAMYCIN 0.3 % OP SOLN
OPHTHALMIC | 3 refills | Status: DC
  Filled 2023-03-17: qty 5, 30d supply, fill #0
  Filled 2023-04-13 – 2023-06-21 (×3): qty 5, 30d supply, fill #1
  Filled 2023-07-24: qty 5, 30d supply, fill #2
  Filled 2023-09-17: qty 5, 30d supply, fill #3

## 2023-04-17 ENCOUNTER — Other Ambulatory Visit (HOSPITAL_COMMUNITY): Payer: Self-pay

## 2023-04-18 ENCOUNTER — Other Ambulatory Visit (HOSPITAL_COMMUNITY): Payer: Self-pay

## 2023-04-18 MED ORDER — LOSARTAN POTASSIUM 100 MG PO TABS
100.0000 mg | ORAL_TABLET | Freq: Every day | ORAL | 3 refills | Status: DC
  Filled 2023-04-18: qty 90, 90d supply, fill #0
  Filled 2023-06-29: qty 90, 90d supply, fill #1
  Filled 2023-10-13: qty 90, 90d supply, fill #2
  Filled 2024-01-09: qty 90, 90d supply, fill #3
  Filled 2024-02-07: qty 90, 90d supply, fill #4

## 2023-04-18 MED ORDER — METOPROLOL SUCCINATE ER 100 MG PO TB24
100.0000 mg | ORAL_TABLET | Freq: Every day | ORAL | 3 refills | Status: DC
  Filled 2023-04-18: qty 90, 90d supply, fill #0
  Filled 2023-06-29: qty 90, 90d supply, fill #1
  Filled 2023-10-11: qty 90, 90d supply, fill #2
  Filled 2024-01-09: qty 90, 90d supply, fill #3
  Filled 2024-02-07: qty 90, 90d supply, fill #4

## 2023-04-21 ENCOUNTER — Other Ambulatory Visit (HOSPITAL_COMMUNITY): Payer: Self-pay

## 2023-04-23 ENCOUNTER — Encounter (HOSPITAL_BASED_OUTPATIENT_CLINIC_OR_DEPARTMENT_OTHER): Payer: Self-pay | Admitting: Radiology

## 2023-04-23 ENCOUNTER — Emergency Department (HOSPITAL_BASED_OUTPATIENT_CLINIC_OR_DEPARTMENT_OTHER): Payer: Medicare Other

## 2023-04-23 ENCOUNTER — Other Ambulatory Visit: Payer: Self-pay

## 2023-04-23 ENCOUNTER — Emergency Department (HOSPITAL_BASED_OUTPATIENT_CLINIC_OR_DEPARTMENT_OTHER)
Admission: EM | Admit: 2023-04-23 | Discharge: 2023-04-23 | Disposition: A | Discharge status: Home / Self Care | Payer: Medicare Other | Attending: Emergency Medicine | Admitting: Emergency Medicine

## 2023-04-23 DIAGNOSIS — J45909 Unspecified asthma, uncomplicated: Secondary | ICD-10-CM | POA: Diagnosis not present

## 2023-04-23 DIAGNOSIS — W1830XA Fall on same level, unspecified, initial encounter: Secondary | ICD-10-CM | POA: Diagnosis not present

## 2023-04-23 DIAGNOSIS — I2510 Atherosclerotic heart disease of native coronary artery without angina pectoris: Secondary | ICD-10-CM | POA: Diagnosis not present

## 2023-04-23 DIAGNOSIS — Z79899 Other long term (current) drug therapy: Secondary | ICD-10-CM | POA: Insufficient documentation

## 2023-04-23 DIAGNOSIS — Y93K1 Activity, walking an animal: Secondary | ICD-10-CM | POA: Diagnosis not present

## 2023-04-23 DIAGNOSIS — M25552 Pain in left hip: Secondary | ICD-10-CM | POA: Insufficient documentation

## 2023-04-23 DIAGNOSIS — I10 Essential (primary) hypertension: Secondary | ICD-10-CM | POA: Insufficient documentation

## 2023-04-23 DIAGNOSIS — S60041A Contusion of right ring finger without damage to nail, initial encounter: Secondary | ICD-10-CM | POA: Insufficient documentation

## 2023-04-23 DIAGNOSIS — M79644 Pain in right finger(s): Secondary | ICD-10-CM | POA: Diagnosis present

## 2023-04-23 DIAGNOSIS — M25512 Pain in left shoulder: Secondary | ICD-10-CM | POA: Insufficient documentation

## 2023-04-23 DIAGNOSIS — Z7982 Long term (current) use of aspirin: Secondary | ICD-10-CM | POA: Insufficient documentation

## 2023-04-23 DIAGNOSIS — T07XXXA Unspecified multiple injuries, initial encounter: Secondary | ICD-10-CM

## 2023-04-23 DIAGNOSIS — W19XXXA Unspecified fall, initial encounter: Secondary | ICD-10-CM

## 2023-04-23 DIAGNOSIS — S60812A Abrasion of left wrist, initial encounter: Secondary | ICD-10-CM | POA: Insufficient documentation

## 2023-04-23 NOTE — ED Triage Notes (Signed)
 Pt fell after her dog got spooked by a fallen tree and drug her down. Pt complaining of left hip pain, left wrist pain, right hand pain, and left shoulder pain.

## 2023-04-23 NOTE — Discharge Instructions (Signed)
 We evaluated you after your fall.  Your x-rays were negative.  Please take 1000 mg of Tylenol every 6 hours as needed for pain or soreness.  You can also apply ice to areas of pain and soreness.  Please follow-up closely with your primary doctor.  If you develop any new or worsening symptoms such as severe uncontrolled pain, headaches, trouble walking, or any other new symptoms, please return to the emergency department for reassessment.
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
## 2023-04-26 ENCOUNTER — Other Ambulatory Visit (HOSPITAL_COMMUNITY): Payer: Self-pay

## 2023-04-29 ENCOUNTER — Other Ambulatory Visit (HOSPITAL_COMMUNITY): Payer: Self-pay

## 2023-05-09 ENCOUNTER — Other Ambulatory Visit (HOSPITAL_COMMUNITY): Payer: Self-pay

## 2023-05-09 MED ORDER — PREDNISONE 10 MG (21) PO TBPK
ORAL_TABLET | ORAL | 0 refills | Status: DC
Start: 2023-05-09 — End: 2023-05-30
  Filled 2023-05-09: qty 21, 6d supply, fill #0

## 2023-05-10 ENCOUNTER — Other Ambulatory Visit (HOSPITAL_COMMUNITY): Payer: Self-pay

## 2023-05-10 MED ORDER — CLOBETASOL PROPIONATE 0.05 % EX OINT
TOPICAL_OINTMENT | CUTANEOUS | 3 refills | Status: DC
  Filled 2023-05-10 – 2023-05-12 (×2): qty 45, 30d supply, fill #0
  Filled 2023-06-09 – 2023-06-13 (×3): qty 45, 30d supply, fill #1
  Filled 2023-07-10 – 2023-08-23 (×8): qty 45, 30d supply, fill #2
  Filled 2023-09-18 – 2023-09-21 (×2): qty 45, 30d supply, fill #3

## 2023-05-11 ENCOUNTER — Other Ambulatory Visit (HOSPITAL_COMMUNITY): Payer: Self-pay

## 2023-05-11 MED ORDER — IMVEXXY MAINTENANCE PACK 4 MCG VA INST
1.0000 | VAGINAL_INSERT | VAGINAL | 6 refills | Status: DC
  Filled 2023-05-11: qty 8, 28d supply, fill #0
  Filled 2023-06-01 – 2023-06-03 (×2): qty 8, 28d supply, fill #1

## 2023-05-12 ENCOUNTER — Other Ambulatory Visit (HOSPITAL_COMMUNITY): Payer: Self-pay

## 2023-05-12 ENCOUNTER — Other Ambulatory Visit: Payer: Self-pay

## 2023-05-17 ENCOUNTER — Other Ambulatory Visit: Payer: Self-pay

## 2023-05-17 ENCOUNTER — Other Ambulatory Visit (HOSPITAL_COMMUNITY): Payer: Self-pay

## 2023-05-17 MED ORDER — IBUPROFEN 600 MG PO TABS
ORAL_TABLET | ORAL | 4 refills | Status: AC
  Filled 2023-05-17: qty 180, 90d supply, fill #0
  Filled 2023-06-29 – 2023-07-24 (×2): qty 180, 90d supply, fill #1
  Filled 2023-10-31: qty 180, 90d supply, fill #2
  Filled 2024-02-07: qty 180, 90d supply, fill #3

## 2023-05-30 ENCOUNTER — Ambulatory Visit (INDEPENDENT_AMBULATORY_CARE_PROVIDER_SITE_OTHER): Admitting: Podiatry

## 2023-05-30 DIAGNOSIS — L600 Ingrowing nail: Secondary | ICD-10-CM | POA: Diagnosis not present
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
## 2023-06-01 ENCOUNTER — Other Ambulatory Visit: Payer: Self-pay

## 2023-06-05 ENCOUNTER — Other Ambulatory Visit (HOSPITAL_COMMUNITY): Payer: Self-pay

## 2023-06-05 MED ORDER — ALPRAZOLAM 1 MG PO TABS
ORAL_TABLET | ORAL | 0 refills | Status: DC
Start: 2023-06-04 — End: 2023-08-08
  Filled 2023-06-05: qty 1, 1d supply, fill #0

## 2023-06-08 ENCOUNTER — Ambulatory Visit (INDEPENDENT_AMBULATORY_CARE_PROVIDER_SITE_OTHER): Payer: Medicare Other | Admitting: Neurology

## 2023-06-08 ENCOUNTER — Encounter: Payer: Self-pay | Admitting: Neurology

## 2023-06-08 ENCOUNTER — Other Ambulatory Visit (HOSPITAL_COMMUNITY): Payer: Self-pay

## 2023-06-08 ENCOUNTER — Other Ambulatory Visit: Payer: Self-pay | Admitting: Urology

## 2023-06-08 VITALS — BP 111/69 | HR 75 | Ht 63.0 in

## 2023-06-08 DIAGNOSIS — M545 Low back pain, unspecified: Secondary | ICD-10-CM | POA: Diagnosis not present

## 2023-06-08 DIAGNOSIS — N319 Neuromuscular dysfunction of bladder, unspecified: Secondary | ICD-10-CM

## 2023-06-08 DIAGNOSIS — M5417 Radiculopathy, lumbosacral region: Secondary | ICD-10-CM

## 2023-06-08 DIAGNOSIS — E538 Deficiency of other specified B group vitamins: Secondary | ICD-10-CM

## 2023-06-08 DIAGNOSIS — M48061 Spinal stenosis, lumbar region without neurogenic claudication: Secondary | ICD-10-CM

## 2023-06-08 DIAGNOSIS — R296 Repeated falls: Secondary | ICD-10-CM

## 2023-06-08 DIAGNOSIS — G8929 Other chronic pain: Secondary | ICD-10-CM

## 2023-06-08 NOTE — Patient Instructions (Signed)
 I will see you again in about 1 year.  If you have new difficulty speaking, face droop, numbness on one side of the body, weakness on one side of the body, or dizziness/imbalance, this could be the sign of a stroke. Don't wait, please call EMS and be evaluated at the nearest emergency room.  The physicians and staff at [facility] are committed to providing excellent care. You may receive a survey requesting feedback about your experience at our office. We strive to receive "very good" responses to the survey questions. If you feel that your experience would prevent you from giving the office a "very good " response, please contact our office to try to remedy the situation. We may be reached at [phone]. Thank you for taking the time out of your busy day to complete the survey.  Jacquelyne Balint, MD [facility]  Preventing Falls at [facility] are common, often dreaded events in the lives of older people. Aside from the obvious injuries and even death that may result, fall can cause wide-ranging consequences including loss of independence, mental decline, decreased activity and mobility. Younger people are also at risk of falling, especially those with chronic illnesses and fatigue.  Ways to reduce risk for falling Examine diet and medications. Warm foods and alcohol dilate blood vessels, which can lead to dizziness when standing. Sleep aids, antidepressants and pain medications can also increase the likelihood of a fall.  Get a vision exam. Poor vision, cataracts and glaucoma increase the chances of falling.  Check foot gear. Shoes should fit snugly and have a sturdy, nonskid sole and a broad, low heel  Participate in a physician-approved exercise program to build and maintain muscle strength and improve balance and coordination. Programs that use ankle weights or stretch bands are excellent for muscle-strengthening. Water aerobics programs and low-impact Tai Chi programs have also  been shown to improve balance and coordination.  Increase vitamin D intake. Vitamin D improves muscle strength and increases the amount of calcium the body is able to absorb and deposit in bones.  How to prevent falls from common hazards Floors - Remove all loose wires, cords, and throw rugs. Minimize clutter. Make sure rugs are anchored and smooth. Keep furniture in its usual place.  Chairs -- Use chairs with straight backs, armrests and firm seats. Add firm cushions to existing pieces to add height.  Bathroom - Install grab bars and non-skid tape in the tub or shower. Use a bathtub transfer bench or a shower chair with a back support Use an elevated toilet seat and/or safety rails to assist standing from a low surface. Do not use towel racks or bathroom tissue holders to help you stand.  Lighting - Make sure halls, stairways, and entrances are well-lit. Install a night light in your bathroom or hallway. Make sure there is a light switch at the top and bottom of the staircase. Turn lights on if you get up in the middle of the night. Make sure lamps or light switches are within reach of the bed if you have to get up during the night.  Kitchen - Install non-skid rubber mats near the sink and stove. Clean spills immediately. Store frequently used utensils, pots, pans between waist and eye level. This helps prevent reaching and bending. Sit when getting things out of lower cupboards.  Living room/ Bedrooms - Place furniture with wide spaces in between, giving enough room to move around. Establish a route through the living room that gives you  something to hold onto as you walk.  Stairs - Make sure treads, rails, and rugs are secure. Install a rail on both sides of the stairs. If stairs are a threat, it might be helpful to arrange most of your activities on the lower level to reduce the number of times you must climb the stairs.  Entrances and doorways - Install metal handles on the walls adjacent to  the doorknobs of all doors to make it more secure as you travel through the doorway.  Tips for maintaining balance Keep at least one hand free at all times. Try using a backpack or fanny pack to hold things rather than carrying them in your hands. Never carry objects in both hands when walking as this interferes with keeping your balance.  Attempt to swing both arms from front to back while walking. This might require a conscious effort if Parkinson's disease has diminished your movement. It will, however, help you to maintain balance and posture, and reduce fatigue.  Consciously lift your feet off of the ground when walking. Shuffling and dragging of the feet is a common culprit in losing your balance.  When trying to navigate turns, use a "U" technique of facing forward and making a wide turn, rather than pivoting sharply.  Try to stand with your feet shoulder-length apart. When your feet are close together for any length of time, you increase your risk of losing your balance and falling.  Do one thing at a time. Don't try to walk and accomplish another task, such as reading or looking around. The decrease in your automatic reflexes complicates motor function, so the less distraction, the better.  Do not wear rubber or gripping soled shoes, they might "catch" on the floor and cause tripping.  Move slowly when changing positions. Use deliberate, concentrated movements and, if needed, use a grab bar or walking aid. Count 15 seconds between each movement. For example, when rising from a seated position, wait 15 seconds after standing to begin walking.  If balance is a continuous problem, you might want to consider a walking aid such as a cane, walking stick, or walker. Once you've mastered walking with help, you might be ready to try it on your own again.

## 2023-06-09 ENCOUNTER — Other Ambulatory Visit (HOSPITAL_COMMUNITY): Payer: Self-pay

## 2023-06-10 ENCOUNTER — Other Ambulatory Visit (HOSPITAL_COMMUNITY): Payer: Self-pay

## 2023-06-12 ENCOUNTER — Other Ambulatory Visit (HOSPITAL_COMMUNITY): Payer: Self-pay

## 2023-06-14 ENCOUNTER — Other Ambulatory Visit (HOSPITAL_COMMUNITY): Payer: Self-pay

## 2023-06-16 ENCOUNTER — Other Ambulatory Visit (HOSPITAL_COMMUNITY): Payer: Self-pay

## 2023-06-16 MED ORDER — BUSPIRONE HCL 5 MG PO TABS
5.0000 mg | ORAL_TABLET | Freq: Three times a day (TID) | ORAL | 2 refills | Status: DC
  Filled 2023-06-16: qty 90, 30d supply, fill #0
  Filled 2023-06-29 – 2023-07-10 (×2): qty 90, 30d supply, fill #1
  Filled 2023-07-24 – 2023-08-07 (×3): qty 90, 30d supply, fill #2

## 2023-06-19 ENCOUNTER — Other Ambulatory Visit (HOSPITAL_COMMUNITY): Payer: Self-pay

## 2023-06-19 MED ORDER — ESTRADIOL 0.1 MG/GM VA CREA
TOPICAL_CREAM | VAGINAL | 4 refills | Status: DC
  Filled 2023-06-19: qty 42.5, 90d supply, fill #0
  Filled 2023-06-20: qty 42.5, 84d supply, fill #0
  Filled 2023-06-21: qty 42.5, 90d supply, fill #0
  Filled 2023-06-22: qty 42.5, fill #0
  Filled 2023-06-23: qty 42.5, 84d supply, fill #0
  Filled 2023-06-24: qty 42.5, 30d supply, fill #0
  Filled 2023-06-29 – 2023-08-23 (×9): qty 42.5, 30d supply, fill #1
  Filled 2023-09-17 – 2023-10-13 (×3): qty 42.5, 30d supply, fill #2
  Filled 2023-10-31 – 2023-11-20 (×2): qty 42.5, 30d supply, fill #3

## 2023-06-20 ENCOUNTER — Other Ambulatory Visit (HOSPITAL_COMMUNITY): Payer: Self-pay

## 2023-06-21 ENCOUNTER — Other Ambulatory Visit (HOSPITAL_COMMUNITY): Payer: Self-pay

## 2023-06-22 ENCOUNTER — Other Ambulatory Visit (HOSPITAL_COMMUNITY): Payer: Self-pay

## 2023-06-22 ENCOUNTER — Other Ambulatory Visit: Payer: Self-pay

## 2023-06-23 ENCOUNTER — Other Ambulatory Visit (HOSPITAL_COMMUNITY): Payer: Self-pay

## 2023-06-24 ENCOUNTER — Other Ambulatory Visit (HOSPITAL_COMMUNITY): Payer: Self-pay

## 2023-06-26 ENCOUNTER — Other Ambulatory Visit (HOSPITAL_COMMUNITY): Payer: Self-pay

## 2023-06-26 MED ORDER — NITROGLYCERIN 0.4 MG SL SUBL
SUBLINGUAL_TABLET | SUBLINGUAL | 1 refills | Status: AC
Start: 2023-06-26 — End: ?
  Filled 2023-06-26: qty 25, 7d supply, fill #0

## 2023-06-27 ENCOUNTER — Other Ambulatory Visit (HOSPITAL_COMMUNITY): Payer: Self-pay

## 2023-06-27 MED ORDER — TIZANIDINE HCL 2 MG PO TABS
2.0000 mg | ORAL_TABLET | Freq: Every day | ORAL | 0 refills | Status: DC
  Filled 2023-06-27: qty 90, 90d supply, fill #0

## 2023-06-29 ENCOUNTER — Other Ambulatory Visit: Payer: Self-pay

## 2023-06-30 ENCOUNTER — Other Ambulatory Visit (HOSPITAL_COMMUNITY): Payer: Self-pay

## 2023-07-11 ENCOUNTER — Other Ambulatory Visit (HOSPITAL_COMMUNITY): Payer: Self-pay

## 2023-07-12 ENCOUNTER — Other Ambulatory Visit (HOSPITAL_COMMUNITY): Payer: Self-pay

## 2023-07-12 ENCOUNTER — Other Ambulatory Visit: Payer: Self-pay

## 2023-07-13 ENCOUNTER — Other Ambulatory Visit (HOSPITAL_COMMUNITY): Payer: Self-pay

## 2023-07-20 ENCOUNTER — Other Ambulatory Visit (HOSPITAL_COMMUNITY): Payer: Self-pay

## 2023-07-21 ENCOUNTER — Other Ambulatory Visit (HOSPITAL_COMMUNITY): Payer: Self-pay

## 2023-07-24 ENCOUNTER — Other Ambulatory Visit (HOSPITAL_COMMUNITY): Payer: Self-pay

## 2023-07-24 ENCOUNTER — Other Ambulatory Visit: Payer: Self-pay

## 2023-07-24 MED ORDER — TIZANIDINE HCL 2 MG PO TABS
2.0000 mg | ORAL_TABLET | Freq: Every day | ORAL | 0 refills | Status: AC
  Filled 2023-07-24 – 2023-09-20 (×3): qty 90, 90d supply, fill #0

## 2023-07-25 ENCOUNTER — Other Ambulatory Visit (HOSPITAL_COMMUNITY): Payer: Self-pay

## 2023-07-25 ENCOUNTER — Other Ambulatory Visit: Payer: Self-pay

## 2023-07-25 MED ORDER — ZOLPIDEM TARTRATE 5 MG PO TABS
5.0000 mg | ORAL_TABLET | Freq: Every day | ORAL | 4 refills | Status: DC
  Filled 2023-07-25: qty 135, 67d supply, fill #0
  Filled 2023-08-01 – 2023-09-28 (×3): qty 135, 67d supply, fill #1
  Filled 2023-12-15: qty 135, 67d supply, fill #2

## 2023-07-26 ENCOUNTER — Other Ambulatory Visit (HOSPITAL_COMMUNITY): Payer: Self-pay

## 2023-07-27 ENCOUNTER — Other Ambulatory Visit: Payer: Self-pay

## 2023-07-27 ENCOUNTER — Other Ambulatory Visit (HOSPITAL_COMMUNITY): Payer: Self-pay

## 2023-08-01 ENCOUNTER — Other Ambulatory Visit (HOSPITAL_COMMUNITY): Payer: Self-pay

## 2023-08-02 ENCOUNTER — Other Ambulatory Visit: Payer: Self-pay

## 2023-08-02 ENCOUNTER — Other Ambulatory Visit (HOSPITAL_COMMUNITY): Payer: Self-pay

## 2023-08-07 ENCOUNTER — Other Ambulatory Visit (HOSPITAL_COMMUNITY): Payer: Self-pay

## 2023-08-08 ENCOUNTER — Other Ambulatory Visit: Payer: Self-pay

## 2023-08-08 ENCOUNTER — Other Ambulatory Visit (HOSPITAL_COMMUNITY): Payer: Self-pay

## 2023-08-08 NOTE — Patient Instructions (Addendum)
 SURGICAL WAITING ROOM VISITATION  Patients having surgery or a procedure may have no more than 2 support people in the waiting area - these visitors may rotate.    Children under the age of 5 must have an adult with them who is not the patient.  Visitors with respiratory illnesses are discouraged from visiting and should remain at home.  If the patient needs to stay at the hospital during part of their recovery, the visitor guidelines for inpatient rooms apply. Pre-op nurse will coordinate an appropriate time for 1 support person to accompany patient in pre-op.  This support person may not rotate.    Please refer to the [facility] website for the visitor guidelines for Inpatients (after your surgery is over and you are in a regular room).       Your procedure is scheduled on:  08/25/2023    Report to [facility] Main Entrance    Report to admitting at   [phone]   Call this number if you have problems the morning of surgery [phone]   Clear liquid diet the day before surgery.  At 1200 noon  Mix 238g of Miralax with 64 ounces of Gatorade ( no red) and drink 8 ounces every 15-30 minutes until completed.      Water Apple juice, white grape juice, white cranberry juice  Coffee and tea- no milk, cream or creamer sugar and sweetener ok  Plain jello- no red Popsicles, ices- no red Gatorade- no red  May continue clear liquids after midnite untl 0430am, then nothing by mouty.                If you have questions, please contact your surgeon's office.      Oral Hygiene is also important to reduce your risk of infection.                                    Remember - BRUSH YOUR TEETH THE MORNING OF SURGERY WITH YOUR REGULAR TOOTHPASTE  DENTURES WILL BE REMOVED PRIOR TO SURGERY PLEASE DO NOT APPLY "Poly grip" OR ADHESIVES!!!   Do NOT smoke after Midnight   Stop all vitamins and herbal supplements 7 days before surgery.   Take these medicines the morning of surgery with A  SIP OF WATER:  amlodiopine, wellbutrin  buspar , toprol    DO NOT TAKE ANY ORAL DIABETIC MEDICATIONS DAY OF YOUR SURGERY  Bring CPAP mask and tubing day of surgery.                              You may not have any metal on your body including hair pins, jewelry, and body piercing             Do not wear make-up, lotions, powders, perfumes/cologne, or deodorant  Do not wear nail polish including gel and S&S, artificial/acrylic nails, or any other type of covering on natural nails including finger and toenails. If you have artificial nails, gel coating, etc. that needs to be removed by a nail salon please have this removed prior to surgery or surgery may need to be canceled/ delayed if the surgeon/ anesthesia feels like they are unable to be safely monitored.   Do not shave  48 hours prior to surgery.               Men may shave face and neck.  Do not bring valuables to the hospital. [facility] IS NOT             RESPONSIBLE   FOR VALUABLES.   Contacts, glasses, dentures or bridgework may not be worn into surgery.   Bring small overnight bag day of surgery.   DO NOT BRING YOUR HOME MEDICATIONS TO THE HOSPITAL. PHARMACY WILL DISPENSE MEDICATIONS LISTED ON YOUR MEDICATION LIST TO YOU DURING YOUR ADMISSION IN THE HOSPITAL!    Patients discharged on the day of surgery will not be allowed to drive home.  Someone NEEDS to stay with you for the first 24 hours after anesthesia.   Special Instructions: Bring a copy of your healthcare power of attorney and living will documents the day of surgery if you haven't scanned them before.              Please read over the following fact sheets you were given: IF YOU HAVE QUESTIONS ABOUT YOUR PRE-OP INSTRUCTIONS PLEASE CALL [phone]   If you received a COVID test during your pre-op visit  it is requested that you wear a mask when out in public, stay away from anyone that may not be feeling well and notify your surgeon if you develop symptoms. If you  test positive for Covid or have been in contact with anyone that has tested positive in the last 10 days please notify you surgeon.    [place] - Preparing for Surgery Before surgery, you can play an important role.  Because skin is not sterile, your skin needs to be as free of germs as possible.  You can reduce the number of germs on your skin by washing with CHG (chlorahexidine gluconate) soap before surgery.  CHG is an antiseptic cleaner which kills germs and bonds with the skin to continue killing germs even after washing. Please DO NOT use if you have an allergy to CHG or antibacterial soaps.  If your skin becomes reddened/irritated stop using the CHG and inform your nurse when you arrive at Short Stay. Do not shave (including legs and underarms) for at least 48 hours prior to the first CHG shower.  You may shave your face/neck. Please follow these instructions carefully:  1.  Shower with CHG Soap the night before surgery and the  morning of Surgery.  2.  If you choose to wash your hair, wash your hair first as usual with your  normal  shampoo.  3.  After you shampoo, rinse your hair and body thoroughly to remove the  shampoo.                           4.  Use CHG as you would any other liquid soap.  You can apply chg directly  to the skin and wash                       Gently with a scrungie or clean washcloth.  5.  Apply the CHG Soap to your body ONLY FROM THE NECK DOWN.   Do not use on face/ open                           Wound or open sores. Avoid contact with eyes, ears mouth and genitals (private parts).                       Wash face,  Genitals (private parts) with your normal soap.             6.  Wash thoroughly, paying special attention to the area where your surgery  will be performed.  7.  Thoroughly rinse your body with warm water from the neck down.  8.  DO NOT shower/wash with your normal soap after using and rinsing off  the CHG Soap.                9.  Pat yourself dry with  a clean towel.            10.  Wear clean pajamas.            11.  Place clean sheets on your bed the night of your first shower and do not  sleep with pets. Day of Surgery : Do not apply any lotions/deodorants the morning of surgery.  Please wear clean clothes to the hospital/surgery center.  FAILURE TO FOLLOW THESE INSTRUCTIONS MAY RESULT IN THE CANCELLATION OF YOUR SURGERY PATIENT SIGNATURE_________________________________  NURSE SIGNATURE__________________________________  ________________________________________________________________________
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
## 2023-08-09 ENCOUNTER — Other Ambulatory Visit: Payer: Self-pay | Admitting: Urology

## 2023-08-11 ENCOUNTER — Other Ambulatory Visit (HOSPITAL_COMMUNITY): Payer: Self-pay

## 2023-08-14 ENCOUNTER — Encounter (HOSPITAL_COMMUNITY): Payer: Self-pay

## 2023-08-14 ENCOUNTER — Other Ambulatory Visit: Payer: Self-pay

## 2023-08-14 ENCOUNTER — Other Ambulatory Visit (HOSPITAL_COMMUNITY): Payer: Self-pay

## 2023-08-14 ENCOUNTER — Encounter (HOSPITAL_COMMUNITY)
Admission: RE | Admit: 2023-08-14 | Discharge: 2023-08-14 | Disposition: A | Discharge status: Home / Self Care | Source: Ambulatory Visit | Attending: Urology

## 2023-08-14 VITALS — BP 125/80 | HR 64 | Temp 98.2°F | Resp 16 | Ht 63.0 in | Wt 130.0 lb

## 2023-08-14 DIAGNOSIS — Z01818 Encounter for other preprocedural examination: Secondary | ICD-10-CM | POA: Diagnosis not present

## 2023-08-14 DIAGNOSIS — Z79899 Other long term (current) drug therapy: Secondary | ICD-10-CM | POA: Insufficient documentation

## 2023-08-14 DIAGNOSIS — R9431 Abnormal electrocardiogram [ECG] [EKG]: Secondary | ICD-10-CM | POA: Diagnosis not present

## 2023-08-14 HISTORY — DX: Disease of blood and blood-forming organs, unspecified: D75.9

## 2023-08-14 HISTORY — DX: Pneumonia, unspecified organism: J18.9

## 2023-08-14 HISTORY — DX: Neuromuscular dysfunction of bladder, unspecified: N31.9

## 2023-08-14 HISTORY — DX: Nonexudative age-related macular degeneration, right eye, intermediate dry stage: H35.3112

## 2023-08-14 HISTORY — DX: Adult sexual abuse, confirmed, initial encounter: T74.21XA

## 2023-08-14 HISTORY — DX: Dry eye syndrome of bilateral lacrimal glands: H04.123

## 2023-08-14 HISTORY — DX: Panic disorder (episodic paroxysmal anxiety): F41.0

## 2023-08-14 LAB — BASIC METABOLIC PANEL WITH GFR
Anion gap: 9 (ref 5–15)
BUN: 28 mg/dL — ABNORMAL HIGH (ref 8–23)
CO2: 25 mmol/L (ref 22–32)
Calcium: 9.6 mg/dL (ref 8.9–10.3)
Chloride: 105 mmol/L (ref 98–111)
Creatinine, Ser: 0.84 mg/dL (ref 0.44–1.00)
GFR, Estimated: >60 mL/min (ref >60)
Glucose, Bld: 159 mg/dL — ABNORMAL HIGH (ref 70–99)
Potassium: 3.9 mmol/L (ref 3.5–5.1)
Sodium: 139 mmol/L (ref 135–145)

## 2023-08-14 LAB — TYPE AND SCREEN
ABO/RH(D): O POS
Antibody Screen: NEGATIVE

## 2023-08-14 LAB — CBC
HCT: 47.2 % — ABNORMAL HIGH (ref 36.0–46.0)
Hemoglobin: 15.1 g/dL — ABNORMAL HIGH (ref 12.0–15.0)
MCH: 31.9 pg (ref 26.0–34.0)
MCHC: 32 g/dL (ref 30.0–36.0)
MCV: 99.6 fL (ref 80.0–100.0)
Platelets: 285 10*3/uL (ref 150–400)
RBC: 4.74 MIL/uL (ref 3.87–5.11)
RDW: 13.3 % (ref 11.5–15.5)
WBC: 12.8 10*3/uL — ABNORMAL HIGH (ref 4.0–10.5)
nRBC: 0 % (ref 0.0–0.2)

## 2023-08-15 ENCOUNTER — Other Ambulatory Visit (HOSPITAL_COMMUNITY): Payer: Self-pay

## 2023-08-15 LAB — URINE CULTURE: Culture: NO GROWTH

## 2023-08-15 MED ORDER — BUSPIRONE HCL 5 MG PO TABS
5.0000 mg | ORAL_TABLET | Freq: Three times a day (TID) | ORAL | 3 refills | Status: DC
  Filled 2023-08-15 – 2023-08-17 (×3): qty 90, 30d supply, fill #0
  Filled 2023-08-17 – 2023-08-19 (×3): qty 90, fill #0
  Filled 2023-08-21 – 2023-09-08 (×2): qty 90, 30d supply, fill #0
  Filled 2023-10-04 – 2023-10-13 (×2): qty 90, 30d supply, fill #1
  Filled 2023-10-28 – 2023-12-15 (×3): qty 90, 30d supply, fill #2
  Filled 2024-01-09: qty 90, 30d supply, fill #3

## 2023-08-15 MED ORDER — DIAZEPAM 10 MG PO TABS
20.0000 mg | ORAL_TABLET | Freq: Once | ORAL | 0 refills | Status: AC
  Filled 2023-08-15: qty 2, 1d supply, fill #0
  Filled 2023-08-22: qty 2, 2d supply, fill #0

## 2023-08-16 ENCOUNTER — Other Ambulatory Visit: Payer: Self-pay

## 2023-08-16 ENCOUNTER — Other Ambulatory Visit (HOSPITAL_COMMUNITY): Payer: Self-pay

## 2023-08-17 ENCOUNTER — Other Ambulatory Visit: Payer: Self-pay

## 2023-08-17 ENCOUNTER — Other Ambulatory Visit (HOSPITAL_COMMUNITY): Payer: Self-pay

## 2023-08-17 NOTE — Anesthesia Preprocedure Evaluation (Addendum)
 Anesthesia Evaluation  Patient identified by MRN, date of birth, ID band Patient awake    Reviewed: Allergy & Precautions, NPO status , Patient's Chart, lab work & pertinent test results, reviewed documented beta blocker date and time   History of Anesthesia Complications (+) PONV and history of anesthetic complications  Airway Mallampati: II  TM Distance: >3 FB Neck ROM: Full    Dental  (+) Teeth Intact, Dental Advisory Given   Pulmonary asthma , PE   Pulmonary exam normal breath sounds clear to auscultation       Cardiovascular hypertension, Pt. on medications and Pt. on home beta blockers (-) angina + CAD  (-) Past MI Normal cardiovascular exam Rhythm:Regular Rate:Normal     Neuro/Psych  Headaches PSYCHIATRIC DISORDERS Anxiety      Neuromuscular disease    GI/Hepatic negative GI ROS, Neg liver ROS,,,  Endo/Other  negative endocrine ROS    Renal/GU negative Renal ROS   Uterovaginal prolapse, complete Urine, incontinence, stress female      Musculoskeletal  (+) Arthritis ,  Fibromyalgia -  Abdominal   Peds  Hematology negative hematology ROS (+)   Anesthesia Other Findings   Reproductive/Obstetrics                             Anesthesia Physical Anesthesia Plan  ASA: 3  Anesthesia Plan: General   Post-op Pain Management: Tylenol  PO (pre-op)* and Toradol IV (intra-op)*   Induction: Intravenous  PONV Risk Score and Plan: 4 or greater and TIVA, Dexamethasone  and Ondansetron   Airway Management Planned: Oral ETT  Additional Equipment:   Intra-op Plan:   Post-operative Plan: Extubation in OR  Informed Consent: I have reviewed the patients History and Physical, chart, labs and discussed the procedure including the risks, benefits and alternatives for the proposed anesthesia with the patient or authorized representative who has indicated his/her understanding and acceptance.      Dental advisory given  Plan Discussed with: CRNA  Anesthesia Plan Comments: (See PAT note 08/14/2023  2nd PIV AFTER INDUCTION)       Anesthesia Quick Evaluation
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
## 2023-08-18 ENCOUNTER — Other Ambulatory Visit (HOSPITAL_COMMUNITY): Payer: Self-pay

## 2023-08-19 ENCOUNTER — Other Ambulatory Visit (HOSPITAL_COMMUNITY): Payer: Self-pay

## 2023-08-21 ENCOUNTER — Other Ambulatory Visit (HOSPITAL_COMMUNITY): Payer: Self-pay

## 2023-08-22 ENCOUNTER — Other Ambulatory Visit (HOSPITAL_COMMUNITY): Payer: Self-pay

## 2023-08-23 ENCOUNTER — Other Ambulatory Visit (HOSPITAL_COMMUNITY): Payer: Self-pay

## 2023-08-23 ENCOUNTER — Other Ambulatory Visit: Payer: Self-pay

## 2023-08-24 ENCOUNTER — Encounter (HOSPITAL_COMMUNITY): Payer: Self-pay | Admitting: Urology

## 2023-08-25 ENCOUNTER — Encounter (HOSPITAL_COMMUNITY)
Admission: RE | Disposition: A | Discharge status: Home / Self Care | Payer: Self-pay | Source: Ambulatory Visit | Attending: Urology

## 2023-08-25 ENCOUNTER — Other Ambulatory Visit (HOSPITAL_COMMUNITY): Payer: Self-pay

## 2023-08-25 ENCOUNTER — Other Ambulatory Visit: Payer: Self-pay

## 2023-08-25 ENCOUNTER — Encounter (HOSPITAL_COMMUNITY): Payer: Self-pay | Admitting: Urology

## 2023-08-25 ENCOUNTER — Ambulatory Visit (HOSPITAL_COMMUNITY): Payer: Self-pay | Admitting: Physician Assistant

## 2023-08-25 ENCOUNTER — Observation Stay (HOSPITAL_COMMUNITY)
Admission: RE | Admit: 2023-08-25 | Discharge: 2023-08-26 | Disposition: A | Discharge status: Home / Self Care | Source: Ambulatory Visit | Attending: Urology | Admitting: Urology

## 2023-08-25 ENCOUNTER — Ambulatory Visit (HOSPITAL_COMMUNITY): Payer: Self-pay | Admitting: Anesthesiology

## 2023-08-25 DIAGNOSIS — N393 Stress incontinence (female) (male): Secondary | ICD-10-CM | POA: Diagnosis present

## 2023-08-25 DIAGNOSIS — N8189 Other female genital prolapse: Secondary | ICD-10-CM

## 2023-08-25 DIAGNOSIS — N813 Complete uterovaginal prolapse: Secondary | ICD-10-CM | POA: Insufficient documentation

## 2023-08-25 DIAGNOSIS — R3914 Feeling of incomplete bladder emptying: Secondary | ICD-10-CM | POA: Insufficient documentation

## 2023-08-25 DIAGNOSIS — I10 Essential (primary) hypertension: Secondary | ICD-10-CM | POA: Insufficient documentation

## 2023-08-25 DIAGNOSIS — I25119 Atherosclerotic heart disease of native coronary artery with unspecified angina pectoris: Secondary | ICD-10-CM

## 2023-08-25 DIAGNOSIS — N819 Female genital prolapse, unspecified: Secondary | ICD-10-CM | POA: Diagnosis present

## 2023-08-25 DIAGNOSIS — Z79899 Other long term (current) drug therapy: Secondary | ICD-10-CM | POA: Insufficient documentation

## 2023-08-25 DIAGNOSIS — Z01818 Encounter for other preprocedural examination: Secondary | ICD-10-CM

## 2023-08-25 DIAGNOSIS — N8110 Cystocele, unspecified: Principal | ICD-10-CM

## 2023-08-25 HISTORY — PX: ROBOTIC ASSISTED LAPAROSCOPIC SACROCOLPOPEXY: SHX5388

## 2023-08-25 HISTORY — PX: INJECTION, BULKING AGENT, URETHRA: SHX7596

## 2023-08-25 LAB — ABO/RH: ABO/RH(D): O POS

## 2023-08-25 SURGERY — SACROCOLPOPEXY, ROBOT-ASSISTED, LAPAROSCOPIC
Anesthesia: General | Site: Urethra

## 2023-08-25 MED ORDER — PROPOFOL 10 MG/ML IV BOLUS
INTRAVENOUS | Status: AC
  Filled 2023-08-25: qty 20

## 2023-08-25 MED ORDER — SUGAMMADEX SODIUM 200 MG/2ML IV SOLN
INTRAVENOUS | Status: DC | PRN
  Administered 2023-08-25: 200 mg via INTRAVENOUS

## 2023-08-25 MED ORDER — FENTANYL CITRATE (PF) 100 MCG/2ML IJ SOLN
INTRAMUSCULAR | Status: DC | PRN
  Administered 2023-08-25 (×2): 50 ug via INTRAVENOUS

## 2023-08-25 MED ORDER — BUPIVACAINE-EPINEPHRINE 0.5% -1:200000 IJ SOLN
INTRAMUSCULAR | Status: DC | PRN
  Administered 2023-08-25: 12 mL

## 2023-08-25 MED ORDER — ONDANSETRON HCL 4 MG/2ML IJ SOLN
INTRAMUSCULAR | Status: AC
  Filled 2023-08-25: qty 4

## 2023-08-25 MED ORDER — FENTANYL CITRATE PF 50 MCG/ML IJ SOSY
25.0000 ug | PREFILLED_SYRINGE | INTRAMUSCULAR | Status: DC | PRN
  Administered 2023-08-25: 50 ug via INTRAVENOUS
  Administered 2023-08-25 (×4): 25 ug via INTRAVENOUS

## 2023-08-25 MED ORDER — DEXAMETHASONE SODIUM PHOSPHATE 10 MG/ML IJ SOLN
INTRAMUSCULAR | Status: AC
Start: 2023-08-25 — End: 2023-08-25
  Filled 2023-08-25: qty 2

## 2023-08-25 MED ORDER — ONDANSETRON HCL 4 MG/2ML IJ SOLN
INTRAMUSCULAR | Status: DC | PRN
  Administered 2023-08-25 (×2): 4 mg via INTRAVENOUS

## 2023-08-25 MED ORDER — LACTATED RINGERS IV SOLN: INTRAVENOUS | Status: DC | PRN

## 2023-08-25 MED ORDER — CEFAZOLIN SODIUM-DEXTROSE 2-4 GM/100ML-% IV SOLN
2.0000 g | INTRAVENOUS | Status: AC
  Administered 2023-08-25: 2 g via INTRAVENOUS
  Filled 2023-08-25: qty 100

## 2023-08-25 MED ORDER — ACETAMINOPHEN 10 MG/ML IV SOLN
1000.0000 mg | Freq: Four times a day (QID) | INTRAVENOUS | Status: AC
  Administered 2023-08-25 – 2023-08-26 (×4): 1000 mg via INTRAVENOUS
  Filled 2023-08-25 (×4): qty 100

## 2023-08-25 MED ORDER — SODIUM CHLORIDE 0.45 % IV SOLN: INTRAVENOUS | Status: DC

## 2023-08-25 MED ORDER — BUPIVACAINE-EPINEPHRINE (PF) 0.5% -1:200000 IJ SOLN
INTRAMUSCULAR | Status: AC
  Filled 2023-08-25: qty 30

## 2023-08-25 MED ORDER — ROCURONIUM BROMIDE 10 MG/ML (PF) SYRINGE
PREFILLED_SYRINGE | INTRAVENOUS | Status: DC | PRN
Start: 2023-08-25 — End: 2023-08-25
  Administered 2023-08-25: 25 mg via INTRAVENOUS
  Administered 2023-08-25: 10 mg via INTRAVENOUS
  Administered 2023-08-25: 15 mg via INTRAVENOUS
  Administered 2023-08-25: 60 mg via INTRAVENOUS
  Administered 2023-08-25: 15 mg via INTRAVENOUS

## 2023-08-25 MED ORDER — ESTRADIOL 0.1 MG/GM VA CREA
TOPICAL_CREAM | VAGINAL | Status: AC
  Filled 2023-08-25: qty 42.5

## 2023-08-25 MED ORDER — BUPIVACAINE LIPOSOME 1.3 % IJ SUSP
INTRAMUSCULAR | Status: AC
  Filled 2023-08-25: qty 20

## 2023-08-25 MED ORDER — ACETAMINOPHEN 500 MG PO TABS
1000.0000 mg | ORAL_TABLET | Freq: Once | ORAL | Status: AC
  Administered 2023-08-25: 1000 mg via ORAL
  Filled 2023-08-25: qty 2

## 2023-08-25 MED ORDER — KETOROLAC TROMETHAMINE 30 MG/ML IJ SOLN
INTRAMUSCULAR | Status: AC
  Filled 2023-08-25: qty 1

## 2023-08-25 MED ORDER — CLINDAMYCIN PHOSPHATE 2 % VA CREA
TOPICAL_CREAM | VAGINAL | Status: AC
  Filled 2023-08-25: qty 40

## 2023-08-25 MED ORDER — LIDOCAINE 2% (20 MG/ML) 5 ML SYRINGE
INTRAMUSCULAR | Status: DC | PRN
Start: 2023-08-25 — End: 2023-08-25
  Administered 2023-08-25: 80 mg via INTRAVENOUS

## 2023-08-25 MED ORDER — HYDROMORPHONE HCL 1 MG/ML IJ SOLN
0.5000 mg | INTRAMUSCULAR | Status: DC | PRN
  Administered 2023-08-26: 0.5 mg via INTRAVENOUS
  Filled 2023-08-25: qty 0.5

## 2023-08-25 MED ORDER — POLYETHYLENE GLYCOL 3350 17 GM/SCOOP PO POWD: 238.0000 g | Freq: Once | ORAL | Status: DC

## 2023-08-25 MED ORDER — KETOROLAC TROMETHAMINE 30 MG/ML IJ SOLN
INTRAMUSCULAR | Status: DC | PRN
  Administered 2023-08-25: 15 mg via INTRAVENOUS

## 2023-08-25 MED ORDER — KETOROLAC TROMETHAMINE 15 MG/ML IJ SOLN
15.0000 mg | Freq: Four times a day (QID) | INTRAMUSCULAR | Status: DC
  Administered 2023-08-25 – 2023-08-26 (×3): 15 mg via INTRAVENOUS
  Filled 2023-08-25 (×4): qty 1

## 2023-08-25 MED ORDER — TRAMADOL HCL 50 MG PO TABS
50.0000 mg | ORAL_TABLET | Freq: Four times a day (QID) | ORAL | Status: DC | PRN
  Administered 2023-08-25: 50 mg via ORAL
  Administered 2023-08-26 (×2): 100 mg via ORAL
  Filled 2023-08-25: qty 2
  Filled 2023-08-25: qty 1
  Filled 2023-08-25 (×2): qty 2

## 2023-08-25 MED ORDER — SULFAMETHOXAZOLE-TRIMETHOPRIM 800-160 MG PO TABS
1.0000 | ORAL_TABLET | Freq: Two times a day (BID) | ORAL | 0 refills | Status: DC
Start: 2023-08-25 — End: 2023-09-21
  Filled 2023-08-25: qty 6, 3d supply, fill #0

## 2023-08-25 MED ORDER — MIDAZOLAM HCL 5 MG/5ML IJ SOLN
INTRAMUSCULAR | Status: DC | PRN
  Administered 2023-08-25: 2 mg via INTRAVENOUS

## 2023-08-25 MED ORDER — ZOLPIDEM TARTRATE 5 MG PO TABS
5.0000 mg | ORAL_TABLET | Freq: Every evening | ORAL | Status: DC | PRN
  Administered 2023-08-25: 5 mg via ORAL
  Filled 2023-08-25: qty 1

## 2023-08-25 MED ORDER — BUPIVACAINE-EPINEPHRINE (PF) 0.5% -1:200000 IJ SOLN
INTRAMUSCULAR | Status: DC | PRN
  Administered 2023-08-25: 38 mL

## 2023-08-25 MED ORDER — PROPOFOL 500 MG/50ML IV EMUL
INTRAVENOUS | Status: AC
Start: 2023-08-25 — End: 2023-08-25
  Filled 2023-08-25: qty 50

## 2023-08-25 MED ORDER — TRAMADOL HCL 50 MG PO TABS
50.0000 mg | ORAL_TABLET | Freq: Four times a day (QID) | ORAL | 0 refills | Status: DC | PRN
  Filled 2023-08-25: qty 15, 2d supply, fill #0

## 2023-08-25 MED ORDER — LIDOCAINE HCL (PF) 2 % IJ SOLN
INTRAMUSCULAR | Status: AC
Start: 2023-08-25 — End: 2023-08-25
  Filled 2023-08-25: qty 10

## 2023-08-25 MED ORDER — ESTRADIOL 0.1 MG/GM VA CREA
1.0000 | TOPICAL_CREAM | VAGINAL | 1 refills | Status: AC
  Filled 2023-08-25: qty 42.5, 30d supply, fill #0
  Filled 2023-08-26 – 2023-09-02 (×7): qty 42.5, fill #0

## 2023-08-25 MED ORDER — ONDANSETRON HCL 4 MG/2ML IJ SOLN
INTRAMUSCULAR | Status: AC
  Filled 2023-08-25: qty 2

## 2023-08-25 MED ORDER — CHLORHEXIDINE GLUCONATE 0.12 % MT SOLN
15.0000 mL | Freq: Once | OROMUCOSAL | Status: AC
  Administered 2023-08-25: 15 mL via OROMUCOSAL

## 2023-08-25 MED ORDER — PROPOFOL 1000 MG/100ML IV EMUL
INTRAVENOUS | Status: AC
  Filled 2023-08-25: qty 100

## 2023-08-25 MED ORDER — DEXMEDETOMIDINE HCL IN NACL 80 MCG/20ML IV SOLN
INTRAVENOUS | Status: DC | PRN
  Administered 2023-08-25 (×2): 4 ug via INTRAVENOUS

## 2023-08-25 MED ORDER — FENTANYL CITRATE PF 50 MCG/ML IJ SOSY
PREFILLED_SYRINGE | INTRAMUSCULAR | Status: AC
  Filled 2023-08-25: qty 1

## 2023-08-25 MED ORDER — PROPOFOL 500 MG/50ML IV EMUL
INTRAVENOUS | Status: AC
  Filled 2023-08-25: qty 50

## 2023-08-25 MED ORDER — CEFAZOLIN SODIUM-DEXTROSE 1-4 GM/50ML-% IV SOLN: 1.0000 g | Freq: Three times a day (TID) | INTRAVENOUS | Status: DC

## 2023-08-25 MED ORDER — ROCURONIUM BROMIDE 10 MG/ML (PF) SYRINGE
PREFILLED_SYRINGE | INTRAVENOUS | Status: AC
Start: 2023-08-25 — End: 2023-08-25
  Filled 2023-08-25: qty 20

## 2023-08-25 MED ORDER — DEXAMETHASONE SODIUM PHOSPHATE 10 MG/ML IJ SOLN
INTRAMUSCULAR | Status: DC | PRN
  Administered 2023-08-25: 10 mg via INTRAVENOUS

## 2023-08-25 MED ORDER — FENTANYL CITRATE PF 50 MCG/ML IJ SOSY
PREFILLED_SYRINGE | INTRAMUSCULAR | Status: AC
  Filled 2023-08-25: qty 2

## 2023-08-25 MED ORDER — LACTATED RINGERS IV SOLN: INTRAVENOUS | Status: DC

## 2023-08-25 MED ORDER — ONDANSETRON HCL 4 MG/2ML IJ SOLN: 4.0000 mg | Freq: Once | INTRAMUSCULAR | Status: DC | PRN

## 2023-08-25 MED ORDER — FENTANYL CITRATE (PF) 100 MCG/2ML IJ SOLN
INTRAMUSCULAR | Status: AC
  Filled 2023-08-25: qty 2

## 2023-08-25 MED ORDER — MIDAZOLAM HCL 2 MG/2ML IJ SOLN
INTRAMUSCULAR | Status: AC
Start: 2023-08-25 — End: 2023-08-25
  Filled 2023-08-25: qty 2

## 2023-08-25 MED ORDER — LACTATED RINGERS IR SOLN
Status: DC | PRN
  Administered 2023-08-25: 1000 mL

## 2023-08-25 MED ORDER — PROPOFOL 500 MG/50ML IV EMUL
INTRAVENOUS | Status: DC | PRN
  Administered 2023-08-25: 125 ug/kg/min via INTRAVENOUS

## 2023-08-25 MED ORDER — DEXMEDETOMIDINE HCL IN NACL 80 MCG/20ML IV SOLN
INTRAVENOUS | Status: AC
  Filled 2023-08-25: qty 20

## 2023-08-25 MED ORDER — ORAL CARE MOUTH RINSE: 15.0000 mL | Freq: Once | OROMUCOSAL | Status: AC

## 2023-08-25 MED ORDER — PROPOFOL 10 MG/ML IV BOLUS
INTRAVENOUS | Status: DC | PRN
  Administered 2023-08-25: 20 mg via INTRAVENOUS
  Administered 2023-08-25: 30 mg via INTRAVENOUS
  Administered 2023-08-25: 50 mg via INTRAVENOUS

## 2023-08-25 MED ORDER — ROCURONIUM BROMIDE 10 MG/ML (PF) SYRINGE
PREFILLED_SYRINGE | INTRAVENOUS | Status: AC
  Filled 2023-08-25: qty 10

## 2023-08-25 MED ORDER — CIPROFLOXACIN IN D5W 400 MG/200ML IV SOLN
400.0000 mg | INTRAVENOUS | Status: AC
  Administered 2023-08-25: 400 mg via INTRAVENOUS
  Filled 2023-08-25: qty 200

## 2023-08-25 MED ORDER — STERILE WATER FOR IRRIGATION IR SOLN
Status: DC | PRN
  Administered 2023-08-25: 3000 mL

## 2023-08-25 MED ORDER — CEFAZOLIN SODIUM-DEXTROSE 1-4 GM/50ML-% IV SOLN
1.0000 g | Freq: Three times a day (TID) | INTRAVENOUS | Status: AC
  Administered 2023-08-25 – 2023-08-26 (×3): 1 g via INTRAVENOUS
  Filled 2023-08-25 (×4): qty 50

## 2023-08-25 MED ORDER — ESTRADIOL 0.1 MG/GM VA CREA
TOPICAL_CREAM | VAGINAL | Status: DC | PRN
Start: 2023-08-25 — End: 2023-08-25
  Administered 2023-08-25: 1 via VAGINAL

## 2023-08-25 SURGICAL SUPPLY — 68 items
BAG COUNTER SPONGE SURGICOUNT (BAG) IMPLANT
BAG URINE DRAIN 2000ML AR STRL (UROLOGICAL SUPPLIES) IMPLANT
BRIEF MESH DISP LRG (UNDERPADS AND DIAPERS) IMPLANT
CATH FOLEY 2WAY SLVR 5CC 16FR (CATHETERS) ×2 IMPLANT
CHLORAPREP W/TINT 26 (MISCELLANEOUS) ×2 IMPLANT
CLIP LIGATING HEM O LOK PURPLE (MISCELLANEOUS) ×2 IMPLANT
CLIP LIGATING HEMO LOK XL GOLD (MISCELLANEOUS) IMPLANT
CLIP LIGATING HEMO O LOK GREEN (MISCELLANEOUS) IMPLANT
CLIP SUT LAPRA TY ABSORB (SUTURE) IMPLANT
COVER BACK TABLE 60X90IN (DRAPES) ×2 IMPLANT
COVER SURGICAL LIGHT HANDLE (MISCELLANEOUS) ×2 IMPLANT
COVER TIP SHEARS 8 DVNC (MISCELLANEOUS) ×2 IMPLANT
DERMABOND ADVANCED .7 DNX12 (GAUZE/BANDAGES/DRESSINGS) ×2 IMPLANT
DRAPE ARM DVNC X/XI (DISPOSABLE) ×8 IMPLANT
DRAPE COLUMN DVNC XI (DISPOSABLE) ×2 IMPLANT
DRAPE INCISE IOBAN 66X45 STRL (DRAPES) ×2 IMPLANT
DRAPE SHEET LG 3/4 BI-LAMINATE (DRAPES) ×4 IMPLANT
DRAPE SURG IRRIG POUCH 19X23 (DRAPES) ×2 IMPLANT
DRIVER NDL LRG 8 DVNC XI (INSTRUMENTS) ×4 IMPLANT
DRIVER NDLE LRG 8 DVNC XI (INSTRUMENTS) ×4 IMPLANT
ELECT PENCIL ROCKER SW 15FT (MISCELLANEOUS) ×2 IMPLANT
ELECT REM PT RETURN 15FT ADLT (MISCELLANEOUS) ×2 IMPLANT
FORCEPS BPLR LNG DVNC XI (INSTRUMENTS) ×2 IMPLANT
FORCEPS PROGRASP DVNC XI (FORCEP) ×2 IMPLANT
FORCEPS TENACULUM DVNC XI (FORCEP) ×2 IMPLANT
GAUZE 4X4 16PLY ~~LOC~~+RFID DBL (SPONGE) IMPLANT
GLOVE BIO SURGEON STRL SZ 6.5 (GLOVE) ×2 IMPLANT
GLOVE SS BIOGEL STRL SZ 8 (GLOVE) IMPLANT
GLOVE SURG LX STRL 7.5 STRW (GLOVE) ×4 IMPLANT
GOWN STRL REUS W/ TWL XL LVL3 (GOWN DISPOSABLE) ×4 IMPLANT
GOWN STRL SURGICAL XL XLNG (GOWN DISPOSABLE) ×2 IMPLANT
HOLDER FOLEY CATH W/STRAP (MISCELLANEOUS) ×2 IMPLANT
IRRIGATION SUCT STRKRFLW 2 WTP (MISCELLANEOUS) ×2 IMPLANT
KIT BASIN OR (CUSTOM PROCEDURE TRAY) ×2 IMPLANT
KIT TURNOVER KIT A (KITS) ×2 IMPLANT
MANIPULATOR UTERINE 4.5 ZUMI (MISCELLANEOUS) IMPLANT
MARKER SKIN DUAL TIP RULER LAB (MISCELLANEOUS) ×2 IMPLANT
MESH Y UPSYLON VAGINAL (Mesh General) IMPLANT
OCCLUDER COLPOPNEUMO (BALLOONS) IMPLANT
PACKING VAGINAL (PACKING) IMPLANT
PAD OB MATERNITY 11 LF (PERSONAL CARE ITEMS) IMPLANT
PAD POSITIONING PINK XL (MISCELLANEOUS) ×2 IMPLANT
SCISSORS MNPLR CVD DVNC XI (INSTRUMENTS) ×2 IMPLANT
SCRUB CHG 4% DYNA-HEX 4OZ (MISCELLANEOUS) IMPLANT
SEAL UNIV 5-12 XI (MISCELLANEOUS) ×8 IMPLANT
SET IRRIG Y TYPE TUR BLADDER L (SET/KITS/TRAYS/PACK) IMPLANT
SET TUBE SMOKE EVAC HIGH FLOW (TUBING) ×2 IMPLANT
SHEET LAVH (DRAPES) ×2 IMPLANT
SOL PREP POV-IOD 4OZ 10% (MISCELLANEOUS) IMPLANT
SOLUTION ELECTROSURG ANTI STCK (MISCELLANEOUS) ×2 IMPLANT
SURGILUBE 2OZ TUBE FLIPTOP (MISCELLANEOUS) IMPLANT
SUT MNCRL AB 4-0 PS2 18 (SUTURE) ×4 IMPLANT
SUT MON AB 3-0 SH27 (SUTURE) IMPLANT
SUT PROLENE 2 0 CT 1 (SUTURE) ×2 IMPLANT
SUT STRATAFIX SPIRAL PDS3-0 (SUTURE) IMPLANT
SUT VIC AB 0 CT1 27XBRD ANTBC (SUTURE) ×2 IMPLANT
SUT VIC AB 2-0 SH 27XBRD (SUTURE) ×10 IMPLANT
SUT VIC AB 3-0 SH 27X BRD (SUTURE) ×2 IMPLANT
SUT VICRYL 0 UR6 27IN ABS (SUTURE) ×2 IMPLANT
SUTURE STRATFX SPIRAL 3-0 PDS+ (SUTURE) ×2 IMPLANT
SYR 50ML LL SCALE MARK (SYRINGE) IMPLANT
SYSTEM BAG RETRIEVAL 10MM (BASKET) IMPLANT
SYSTEM URETHRAL BULK BULKAMID (Female Continence) IMPLANT
TOWEL OR 17X26 10 PK STRL BLUE (TOWEL DISPOSABLE) ×2 IMPLANT
TRAY LAPAROSCOPIC (CUSTOM PROCEDURE TRAY) ×2 IMPLANT
TROCAR Z THREAD OPTICAL 12X100 (TROCAR) ×2 IMPLANT
TROCAR Z-THREAD FIOS 5X100MM (TROCAR) ×2 IMPLANT
WATER STERILE IRR 1000ML POUR (IV SOLUTION) ×2 IMPLANT

## 2023-08-25 NOTE — Anesthesia Procedure Notes (Signed)
 Procedure Name: Intubation Date/Time: 08/25/2023 8:00 AM  Performed by: Erleen Hawking, CRNAPre-anesthesia Checklist: Patient identified, Emergency Drugs available, Suction available, Patient being monitored and Timeout performed Patient Re-evaluated:Patient Re-evaluated prior to induction Oxygen  Delivery Method: Circle system utilized Preoxygenation: Pre-oxygenation with 100% oxygen  Induction Type: IV induction Ventilation: Mask ventilation without difficulty Laryngoscope Size: Mac and 3 Grade View: Grade I Tube type: Oral Tube size: 7.0 mm Number of attempts: 1 Airway Equipment and Method: Stylet Placement Confirmation: ETT inserted through vocal cords under direct vision, positive ETCO2, CO2 detector and breath sounds checked- equal and bilateral Secured at: 21 cm Tube secured with: Tape Dental Injury: Teeth and Oropharynx as per pre-operative assessment  Comments: Head and neck neutral.   ATOI.

## 2023-08-25 NOTE — Transfer of Care (Signed)
 Immediate Anesthesia Transfer of Care Note  Patient: Kaylee Weiss  Procedure(s) Performed: Procedure(s) with comments: SACROCOLPOPEXY, ROBOT-ASSISTED, LAPAROSCOPIC (Bilateral) - ROBOTIC SACROCOLPOPEXY, SUPRACERVICAL HYSTERECTOMY WITH BILATERAL SALPINGO-OOPHORECTOMY INJECTION, BULKING AGENT, URETHRA (N/A)  Patient Location: PACU  Anesthesia Type:General  Level of Consciousness:  sedated, patient cooperative and responds to stimulation  Airway & Oxygen  Therapy:Patient Spontanous Breathing and Patient connected to face mask oxgen  Post-op Assessment:  Report given to PACU RN and Post -op Vital signs reviewed and stable  Post vital signs:  Reviewed and stable  Last Vitals:  Vitals:   08/25/23 0545  BP: (!) 157/92  Pulse: 90  Resp: 16  Temp: 36.7 C  SpO2: 98%    Complications: No apparent anesthesia complications

## 2023-08-25 NOTE — Anesthesia Postprocedure Evaluation (Signed)
 Anesthesia Post Note  Patient: Kaylee Weiss  Procedure(s) Performed: SACROCOLPOPEXY, ROBOT-ASSISTED, LAPAROSCOPIC (Bilateral: Abdomen) INJECTION, BULKING AGENT, URETHRA (Urethra)     Patient location during evaluation: PACU Anesthesia Type: General Level of consciousness: awake and alert Pain management: pain level controlled Vital Signs Assessment: post-procedure vital signs reviewed and stable Respiratory status: spontaneous breathing, nonlabored ventilation and respiratory function stable Cardiovascular status: blood pressure returned to baseline and stable Postop Assessment: no apparent nausea or vomiting Anesthetic complications: no   No notable events documented.  Last Vitals:  Vitals:   08/25/23 1430 08/25/23 1503  BP: 132/70 131/78  Pulse: 62 66  Resp: 14 16  Temp:  (!) 36.3 C  SpO2: 100% 100%    Last Pain:  Vitals:   08/25/23 1608  TempSrc:   PainSc: 4                  Erin Havers

## 2023-08-25 NOTE — Interval H&P Note (Signed)
 History and Physical Interval Note:  08/25/2023 7:28 AM  Kaylee Weiss  has presented today for surgery, with the diagnosis of PELVIC ORGAN PROLAPSE, stress urinary incontinence.  The various methods of treatment have been discussed with the patient and family. After consideration of risks, benefits and other options for treatment, the patient has consented to  Procedure(s) with comments: SACROCOLPOPEXY, ROBOT-ASSISTED, LAPAROSCOPIC (Bilateral) - ROBOTIC SACROCOLPOPEXY, SUPRACERVICAL HYSTERECTOMY WITH BILATERAL SALPINGO-OOPHORECTOMY INJECTION, BULKING AGENT, URETHRA (N/A) as a surgical intervention.  The patient's history has been reviewed, patient examined, no change in status, stable for surgery.  I have reviewed the patient's chart and labs.  Questions were answered to the patient's satisfaction.     Andrez Banker

## 2023-08-25 NOTE — Progress Notes (Signed)
 Patient could not find her wedding ring after surgery. I reached out to PACU/[facility] and they said it was removed to start an IV during surgery in the hand and that the OR RN had it. It was returned to the bedside by RN and is now on patient's finger.

## 2023-08-25 NOTE — H&P (Signed)
 cc: perineal burning, renal cyst, urinary incontinence   01/29/20: 78 year old woman referred by her gyn for trace blood seen on urinalysis dipstick as well as a spot of blood seen on pad. She has been worked up for vaginal bleeding and had vulvar biopsies and source of bleeding has not been established. Patient has longstanding urinary incontinence. She voids every 2 hours to help prevent total incontinence. She does report having a urodynamic study a very long time ago at [facility]. She is unable to give a urine sample today. She has significantly decreased bladder sensation.   02/18/20: 78 year old woman with the above history returns for follow-up after CT urogram. She initially presented with blood on the toilet paper which has dramatically decreased over the last few weeks. CT urogram shows a right Bosniak 1 renal cyst with bilateral extrarenal pelvises. No filling defects noted. Patient does have significant spinal stenosis for which she is having steroid injections. She has significant decreased sensation and ability to fully empty her bladder. She currently double voids and has episodes of what sounds like overflow incontinence.   02/18/21: 78 year old woman initially seen for blood on spot of pad and microscopic hematuria who had a negative hematuria work-up. She returns for follow-up for decreased urinary sensation and incomplete emptying. Patient's PVR in the office today is 0. She has a nonhealing area on her labia that is been biopsied and she is working with her GYN to heal this. Patient does wear a pad for urinary incontinence. She overall feels wonderful and is walking a mile a day.   02/23/22: 78 year old woman whose had a prior negative microscopic hematuria workup here for follow-up for feeling of incomplete emptying and urinary incontinence. In the last year she has had a pretty significant fall resulting in concussion and broken arm. She also recently had surgery for fungus in her  sinuses. Since recovering from both of these she is feeling significantly better. She is not experiencing any pain but is using ibuprofen  and Tylenol . She had an EMG done and was told by the neurologist that most people with her studies are in wheelchairs. She feels like her urinary incontinence has progressed and that it is more difficult to void and she is double or triple voiding. She also will have total incontinence when moving from sitting to standing but this does not happen every time. Her PVR today is 0 cc. She is not getting UTIs or gross hematuria.   01/20/2023: 78 year old woman with a prior negative microscopic hematuria evaluation as well as urinary continence/incomplete emptying here for annual follow-up. In the interim she has been doing timed voids every 2 hours which is helping her empty more completely. She has not had any UTIs but did develop perineal burning. She is using vaginal estradiol  cream but is wondering if she has developed an allergy to the pads. In addition her husband has undergone shoulder surgery and is scheduled for a second shoulder surgery coming up.   04/21/2023: 78 year old woman with a history of urinary continence/incomplete emptying, renal cyst and pelvic/perineal burning here for follow-up. PVR 0 cc. Her perineal/pelvic burning has significantly improved with estradiol  and clobetasol . She alternates those creams and uses them a few times a week. In addition she feels like she has had an increasingly difficult time emptying her bladder. She does double void and wears incontinence pads.   05/12/2023: 78 year old woman with history of urinary continence/incomplete emptying as well as pelvic/perineal burning here for follow-up. She was seen about 3 weeks ago  and overall doing well. In the last 3 weeks she has had significant changes with her vision requiring injections and light treatment, fallen 3 times and developed a knot/bulge in her vagina. She was evaluated by Dr.  Serge Dancer and referred back to consideration of sacrocolpopexy. She is very anxious today and undergoing a head MRI next week.   05/30/2023: The patient had urodynamics which demonstrated no postvoid residual at the beginning of the study. Her bladder capacity was quite large with around 600 cc. She had no evidence of overactive bladder nor stress incontinence with her prolapse reduced. She was unable to empty her bladder at the end of the procedure.   4/25: Today the patient is here for further discussion of her prolapse. The patient is starting to have significant pain due to her prolapse. She was noted to have prolapse extending beyond the vaginal introitus. Her voiding symptoms are otherwise unremarkable/unchanged. She does have a history of a fairly nonfunctioning bladder, but works very hard to get her bladder empty. Her recent PVRs have been 0.   The patient still has her uterus and ovaries.   The patient's past surgical history is significant for 2 separate cystocele repairs, the most recent one was around 2005 or 6 which was a transabdominal surgery. She has a large Pfannenstiel incision as a result. This failed quite quickly and the patient states that she has had lots of symptoms and problems since.   Interval: Today the patient is here for evaluation. She has recently had some worsening flooding incontinence upon standing. She would like to add the sling procedure to her upcoming sacrocolpopexy. She understands that she may end up cathing as a result of this, but has considered this risk, and would like to add that to her consent. She thinks she might be allergic to the sanitary pads.     ALLERGIES: Demerol Percocet    MEDICATIONS: Clobetasol  Propionate 0.05 % Ointment Apply to affected area twice a week at night  GoodSense Aspirin Adult Low St 81 MG Tablet Chewable  Metoprolol  Succinate ER 50 MG Tablet Extended Release 24 Hour  Ambien  10 MG Tablet  amLODIPine  Bes+SyrSpend SF  Estradiol   0.1 MG/GM Cream Place 1 g of cream in the vagina three times a week  Fish Oil  Folic Acid  1 MG Tablet  IBU 600 MG Tablet  Losartan  Potassium 100 MG Tablet  Prednisolone  Tylenol   Vagina Suppository  valACYclovir  HCl 500 MG Tablet  Vitamin B12 1000 MCG Tablet Extended Release  Vitamin D3  Wellbutrin  XL 150 MG Tablet Extended Release 24 Hour     GU PSH: Complex cystometrogram, w/ void pressure and urethral pressure profile studies, any technique - 05/29/2023 Complex Uroflow - 05/29/2023 Cystoscopy - 2021 Emg surf Electrd - 05/29/2023 Inject For cystogram - 05/29/2023 Intrabd voidng Press - 05/29/2023 Locm 300-399Mg /Ml Iodine,1Ml - 2021       PSH Notes: construction of bladder   NON-GU PSH: Bilateral Tubal Ligation Hand/finger Surgery, Right Visit Complexity (formerly GPC1X) - 06/30/2023, 06/06/2023, 05/12/2023, 01/20/2023     GU PMH: Complete uterovaginal prolapse - 06/30/2023, - 06/06/2023, - 05/12/2023 Incomplete bladder emptying - 06/30/2023, - 06/06/2023, - 05/29/2023, - 05/12/2023, - 04/21/2023, - 01/20/2023, - 02/23/2022, - 02/18/2021, Discussed possible bladder management with patient including learning self catheterization however she would like to hold off on this for now. We will follow-up in 1 year to reassess symptoms., - 2021 Incontinence w/o Sensation - 05/12/2023, - 04/21/2023, - 01/20/2023, - 02/23/2022, - 02/18/2021, I discussed  with patient whether not she would like to repeat a urodynamic study in attempt any management options based on these results. Patient states she has been living this way for a long time and is not particularly interested in a workup which is fine., - 2021 Pelvic/perineal pain - 05/12/2023, - 04/21/2023, - 01/20/2023 Postmenopausal atrophic vaginitis - 05/12/2023 Renal cyst - 04/21/2023, - 01/26/2023, - 01/20/2023, Bosniak 1 and no further follow-up necessary, - 2021 Gross hematuria, Unclear history of gross hematuria verses atrophic vaginitis that is irritated with  straining. Cystoscopy and CT urogram were both normal. No source identified. - 2021, - 2021, Cystoscopy is within normal limits. I am unclear if this is actually gross hematuria or external blood on the pad. She has a somewhat retracted urethral meatus and mild vaginal atrophy. It is possible that irritation of this thin mucosa is causing blood. She has not seen any blood in the toilet bowl. Her urinalysis showed trace blood on dipstick but no microscopic count was done. Cystoscopy today was within normal limits. Patient is anxious about the source and we discussed proceeding with upper tract imaging to rule out source of blood. I would also like her to leave a urine sample at her next visit., - 2021    NON-GU PMH: Arthritis GERD Hypercholesterolemia Hypertension    FAMILY HISTORY: Heart Disease - Grandmother, Aunt, Mother   SOCIAL HISTORY: Marital Status: Married Preferred Language: English; Ethnicity: Not Hispanic Or Latino; Race: White Current Smoking Status: Patient has never smoked.   Tobacco Use Assessment Completed: Used Tobacco in last 30 days? Does drink.  Drinks 2 caffeinated drinks per day.    REVIEW OF SYSTEMS:    GU Review Female:   Patient denies frequent urination, hard to postpone urination, burning /pain with urination, get up at night to urinate, leakage of urine, stream starts and stops, trouble starting your stream, have to strain to urinate, and being pregnant.  Gastrointestinal (Upper):   Patient denies nausea, vomiting, and indigestion/ heartburn.  Gastrointestinal (Lower):   Patient denies diarrhea and constipation.  Constitutional:   Patient denies fever, night sweats, weight loss, and fatigue.  Skin:   Patient denies skin rash/ lesion and itching.  Eyes:   Patient denies blurred vision and double vision.  Ears/ Nose/ Throat:   Patient denies sore throat and sinus problems.  Hematologic/Lymphatic:   Patient denies swollen glands and easy bruising.   Cardiovascular:   Patient denies chest pains and leg swelling.  Respiratory:   Patient denies cough and shortness of breath.  Endocrine:   Patient denies excessive thirst.  Musculoskeletal:   Patient denies back pain and joint pain.  Neurological:   Patient denies headaches and dizziness.  Psychologic:   Patient denies depression and anxiety.   VITAL SIGNS: None   MULTI-SYSTEM PHYSICAL EXAMINATION:    Constitutional: Well-nourished. No physical deformities. Normally developed. Good grooming.  Respiratory: Normal breath sounds. No labored breathing, no use of accessory muscles.   Cardiovascular: Regular rate and rhythm. No murmur, no gallop. Normal temperature, normal extremity pulses, no swelling, no varicosities.      Complexity of Data:  Source Of History:  Patient  Records Review:   Previous Doctor Records, Previous Patient Records, POC Tool  Urine Test Review:   Urinalysis  Urodynamics Review:   Review Bladder Scan, Review Urodynamics Tests   PROCEDURES:          Visit Complexity - G2211    ASSESSMENT:      ICD-10 Details  1  GU:   Complete uterovaginal prolapse - N81.3   2   Incontinence w/o Sensation - N39.42   3   Incomplete bladder emptying - R39.14    PLAN:           Document Letter(s):  Created for Patient: Clinical Summary         Notes:   I reviewed the patient's urodynamics and we discussed her symptoms in detail. We we discussed placement of a mid urethral sling versus urethral bulking procedure. The patient was willing to perform clean intermittent catheterization if she did develop urinary retention following a sling procedure. She understands the risks associated with it. I told the patient I thought that the best place to start with her given that she has a history of incomplete bladder emptying and hyper detrusor function would be bulking med. This may be enough to prevent her from having incontinence and still allow her to void on her own. If it is not  enough, we can certainly perform a mid urethral sling in the future. I went through all this with her in detail. Will plan to add that to her upcoming procedure for hysterectomy and sacrocolpopexy.

## 2023-08-25 NOTE — Discharge Instructions (Signed)

## 2023-08-25 NOTE — Op Note (Signed)
 Preoperative diagnosis:   Pelvic organ prolapse Stress urinary incontinence  Postoperative diagnosis:   Same  Procedure: Robotic-assisted laparoscopic supracervical hysterectomy and  bilateral salpingo-oopherectomy Robotic-assisted laparoscopic sacrocolpopexy Cystoscopy and urethral Bulkamid installation  Surgeon: Morene MICAEL Salines, MD First assistant: Gwenlyn Bourdon  Anesthesia: General  Complications: None  Intraoperative findings:  [employer] Upsilon Y mesh used for the sacrocolpopexy.  EBL: 150 mL  Specimens: Uterus and proximal cervix, bilateral fallopian tubes and ovaries  Indication: Kaylee Weiss is a 78 y.o. female patient with symptomatic pelvic organ prolapse.    After reviewing the management options for treatment, she elected to proceed with the above surgical procedure(s). We have discussed the potential benefits and risks of the procedure, side effects of the proposed treatment, the likelihood of the patient achieving the goals of the procedure, and any potential problems that might occur during the procedure or recuperation. Informed consent has been obtained.  Description of procedure:  The patient was taken to the operating room and general anesthesia was induced.  The patient was placed in the dorsal lithotomy position, prepped and draped in the usual sterile fashion, and preoperative antibiotics were administered. A preoperative time-out was performed.    A Foley catheter was then placed and placed to gravity drainage. I then made a periumbilical incision carrying the dissection down to the patient's fascia with electrocautery.  Once to the fascia, the fascia was incised and a small puncture hole made in the peritoneum to allow passage of a 8mm port.   The abdomen was insufflated and the remaining ports placed under digital guidance.  2 ports were placed lateral to the umbilicus on the right proximally 10 cm apart.  The most lateral port was approximately  3 cm above the anterior iliac spine.  2 additional ports were placed in the patient's right side in comparable positions to the most lateral port on the right was a 12 mm port.the robot was then docked at an angle from the leg obliquely along the side of the left leg.  We then began our surgery by cleaning up some of the pelvic adhesions to the small bowel and colon.  Once this was completed I started dissecting at the sacral promontory located 3 cm medial to the location where the ureter crosses over the right iliac vessels at the pelvic brim. The posterior peritoneum was incised and the sacral prominence cleared off an area taking care to avoid the middle sacral vessels and the iliac branches.  I then created a posterior peritoneal tunnel starting at the sacral promontory and tunneling down the right pelvic sidewall down into the pelvis breaking back through the posterior peritoneum around the vesico-vaginal junction posteriorly.  I then continued the posterior dissection retracting down on the rectum and finding the avascular plane between the posterior vaginal wall and the rectum.  I carried this dissection down as far as I could to along the area of the perineal body.  Then focused my attention to the uterus and hysterectomy.  I first started by taking the right round ligament with a series of by polar cautery.  I then dissected the anterior leaf of the broad ligament slightly more proximal and then distally down across the anterior mucosa to the salpinx and the internal cervical os.  I then took of the uterine ovarian ligament on the right and dissected free the right salpinx. Once the anterior leaf of the broad ligament had been completely dissected on the patient's right and a small bladder  flap had been created anteriorly attention was turned to the left side where a similar dissection was carried out. I then turned my attention to the anterior plane between the anterior vaginal wall and the bladder.  I  was able to obtain access to the avascular plane and with a combination of both monopolar cautery and blunt dissection was able to get down to the bladder neck.  I then turned my attention back to the patient's uterus and skeletonized the right uterine artery and vein and then took this with a series of bipolar moves.  I then performed a similar uterus pedicle ligation on the left.  At his point I was able to identify the patient's cervix and came through supracervical with monopolar cautery once the uterus was freed from all its attachments it was pushed into the left paracolic gutter and our attention was turned to placing the mesh.  Mesh was measured at approximately 9 cm anteriorly and 11.5 cm posteriorly and I cut this on the back table.  The mesh was then placed into the patient's abdomen through the assistant port and the anterior leaf was secured down onto the anterior vaginal wall with the apex at the bladder neck.  The posterior leaf was then secured down on the posterior vaginal wall.  These were sewn down with 2-0 Vicryl.  Between 6 and 8 were done on each side.  At this point I then went back to the previously dissected sacral promontory and posterior peritoneal tunnel and inserted a instrument through the tunnel and grasped the end of the mesh at the vaginal cuff and pull it up to the sacrum.  I then checked to ensure that the sacral mesh was not too tight by performing a vaginal exam.  I then secured the sacral leg of the mesh using a 0 Prolene.  I then reapproximated the posterior peritoneum with a 2-0 Vicryl in a running fashion around the sacral promontory.  The pelvic peritoneum was closed using a pursestring.  A small Endo Catch bag was then gently passed through the assistant port and the uterus was placed in the bag.  The bag was then brought out through the camera port once the trochars were removed.  We then made a slightly larger extraction incision to remove the uterus.  The fascia was  then closed with 0 Vicryl in a figure-of-eight fashion.  The skin was closed with 4-0 Monocryl's.  Dermabond was applied to the incision and exparel  injected into the incisions.  Estrace  impregnated packing was then placed into her vagina which will be left in overnight.  I then turned my attention to the Bulkamid procedure.  At this point I instilled the Bulkamid scope into the patient's bladder, pulled back 1 cm and injected 0.5 cc of Bulkamid into the mid urethra at the 7:00, 11:00, 2:00 and 4:00 positions.  I then put in a 14 [place] Foley.  The patient was subsequently extubated returned to the PACU condition.

## 2023-08-26 ENCOUNTER — Other Ambulatory Visit (HOSPITAL_COMMUNITY): Payer: Self-pay

## 2023-08-26 DIAGNOSIS — N393 Stress incontinence (female) (male): Secondary | ICD-10-CM | POA: Diagnosis not present

## 2023-08-26 NOTE — TOC Initial Note (Signed)
 Transition of Care [facility]) - Initial/Assessment Note    Patient Details  Name: Kaylee Weiss MRN: [id number] Date of Birth: 1945/11/25  [facility]) CM/SW Contact:    Sonda Manuella Quill, RN Phone Number: 08/26/2023, 10:09 AM  Clinical Narrative:                 Kaylee Weiss w/ pt and spouse Kaylee Weiss [phone]) in room; pt says she lives at home; she plans to return at d/c; her husband will provide transportation; pt verified insurance; she denied SDOH risks; pt says she does not have DME, HH services, or home oxygen ; no TOC needs; TOC is signing off.  Expected Discharge Plan: Home/Self Care Barriers to Discharge: No Barriers Identified   Patient Goals and CMS Choice Patient states their goals for this hospitalization and ongoing recovery are:: home CMS Medicare.gov Compare Post Acute Care list provided to:: Patient        Expected Discharge Plan and Services   Discharge Planning Services: NA   Living arrangements for the past 2 months: Single Family Home Expected Discharge Date: 08/25/23               DME Arranged: N/A DME Agency: NA       HH Arranged: NA HH Agency: NA        Prior Living Arrangements/Services Living arrangements for the past 2 months: Single Family Home Lives with:: Spouse Patient language and need for interpreter reviewed:: Yes Do you feel safe going back to the place where you live?: Yes      Need for Family Participation in Patient Care: Yes (Comment) Care giver support system in place?: Yes (comment) Current home services:  (n/a) Criminal Activity/Legal Involvement Pertinent to Current Situation/Hospitalization: No - Comment as needed  Activities of Daily Living   ADL Screening (condition at time of admission) Independently performs ADLs?: Yes (appropriate for developmental age) Is the patient deaf or have difficulty hearing?: No Does the patient have difficulty seeing, even when wearing glasses/contacts?: No Does the  patient have difficulty concentrating, remembering, or making decisions?: No  Permission Sought/Granted Permission sought to share information with : Case Manager Permission granted to share information with : Yes, Verbal Permission Granted  Share Information with NAME: Case Manager     Permission granted to share info w Relationship: Shaguana Love (spouse) [phone]     Emotional Assessment Appearance:: Appears stated age Attitude/Demeanor/Rapport: Gracious Affect (typically observed): Accepting Orientation: : Oriented to Self, Oriented to Place, Oriented to  Time, Oriented to Situation Alcohol / Substance Use: Not Applicable Psych Involvement: No (comment)  Admission diagnosis:  Uterovaginal prolapse, complete [N81.3] Urine, incontinence, stress female [N39.3] Prolapse of female pelvic organs [N81.9] Patient Active Problem List   Diagnosis Date Noted   Prolapse of female pelvic organs 08/25/2023   Anxiety 09/10/2020   Arthritis 09/10/2020   Asthma 09/10/2020   Elevated lipids 09/10/2020   Migraine 09/10/2020   Dry eye syndrome of both lacrimal glands 05/03/2019   Osteopenia of multiple sites 05/03/2019   Coronary artery disease involving native coronary artery of native heart without angina pectoris 11/19/2018   Fecal smearing 09/16/2018   Macular degeneration 09/16/2018   Rectocele 09/16/2018   Cholelithiasis with chronic cholecystitis 10/06/2017   Primary osteoarthritis involving multiple joints 10/16/2016   Pure hypercholesterolemia 10/11/2016   Constipation 07/25/2016   Spinal stenosis of lumbar region 12/16/2015   Memory change 12/16/2015   Chronic midline low back pain 09/15/2015   Cholelithiasis 06/05/2015   Common  bile duct dilatation 06/05/2015   Angina pectoris (HCC) 04/23/2015   Insomnia 03/18/2015   Arteriosclerosis of coronary artery 03/05/2015   Cauda equina syndrome (HCC) 10/30/2014   Unstable gait 02/12/2014   Urinary incontinence 02/12/2014    Essential hypertension 02/12/2014   Glaucoma suspect 07/04/2011   Fibrous papule of nose 02/08/2011   Sebaceous hyperplasia 02/08/2011   Basal cell papilloma 11/02/2010   Scar 11/02/2010   Actinic keratoses 09/27/2010   Herpes 09/27/2010   Lichen simplex 09/27/2010   Herpesviral infection 09/27/2010   Back pain, chronic 08/26/2010   Enthesopathy of hip 08/26/2010   Cervical spondylosis without myelopathy 03/19/2010   Fibromyalgia 03/19/2010   Lumbar and sacral osteoarthritis 03/19/2010   Mallet deformity of left little finger 10/27/2009   PCP:  Claudene Rayfield HERO, MD Pharmacy:   [facility] [address] [address] Phone: [phone] Fax: [phone]     Social Drivers of Health (SDOH) Social History: SDOH Screenings   Food Insecurity: No Food Insecurity (08/26/2023)  Housing: Low Risk  (08/26/2023)  Transportation Needs: No Transportation Needs (08/26/2023)  Utilities: Not At Risk (08/26/2023)  Financial Resource Strain: Low Risk  (02/13/2023)   Received from [facility]  Physical Activity: Unknown (11/22/2019)   Received from [facility]  Social Connections: Socially Integrated (08/26/2023)  Stress: No Stress Concern Present (11/22/2019)   Received from [facility]  Tobacco Use: Low Risk  (08/25/2023)   SDOH Interventions: Food Insecurity Interventions: Intervention Not Indicated, Inpatient TOC Housing Interventions: Intervention Not Indicated, Inpatient TOC Transportation Interventions: Intervention Not Indicated, Inpatient TOC Utilities Interventions: Intervention Not Indicated, Inpatient TOC   Readmission Risk Interventions     No data to display

## 2023-08-26 NOTE — Care Management Obs Status (Signed)
 MEDICARE OBSERVATION STATUS NOTIFICATION   Patient Details  Name: Kaylee Weiss MRN: [id number] Date of Birth: [birth year]   Medicare Observation Status Notification Given:  Yes    Sonda Manuella Quill, RN 08/26/2023, 9:42 AM

## 2023-08-26 NOTE — Discharge Summary (Signed)
 Physician Discharge Summary  Patient ID: Kaylee Weiss MRN: [id number] DOB/AGE: 05/07/1945 78 y.o.  Admit date: 08/25/2023 Discharge date: 08/26/2023  Admission Diagnoses:  Discharge Diagnoses:  Principal Problem:   Prolapse of female pelvic organs   Discharged Condition: good  Hospital Course: Patient was admitted following sacrocolpopexy and Bulkamid installation.  She has done well.  Packing and Foley were removed this morning.  She is voiding with a weak stream and does not feel like she is emptying her bladder.  She was taught CIC and has done that successfully.  Will check a postvoid to give her some objective feedback on how she is emptying.  Otherwise she is tolerating a regular diet and her vitals are stable.  Consults: None  Significant Diagnostic Studies: None  Treatments: surgery: Robotic assisted laparoscopic sacrocolpopexy, cystoscopy with bulkamid instillation  Discharge Exam: Blood pressure (!) 143/72, pulse 63, temperature 98.2 F (36.8 C), temperature source Oral, resp. rate 20, height 5' 3.5 (1.613 m), weight 59 kg, SpO2 95%. She looks well, alert and oriented Respiratory-nonlabored Abdomen-soft and nontender  Disposition: Discharge disposition: 01-Home or Self Care       Discharge Instructions     Discharge patient   Complete by: As directed    Discharge disposition: 01-Home or Self Care   Discharge patient date: 08/25/2023      Allergies as of 08/26/2023       Reactions   Demerol [meperidine] Anaphylaxis   Gabapentin  Other (See Comments)   Fatigue and foggy headed   Codeine Nausea And Vomiting   Hydralazine  Other (See Comments)   hydralazine    Other Other (See Comments)   Suicidal ideation Alcohol (drinking kind); takes liver enzymes and elevates them   Oxycodone -acetaminophen  Nausea Only, Other (See Comments)   acetaminophen  / oxycodone    Percocet [oxycodone -acetaminophen ] Nausea And Vomiting   Xiidra  [lifitegrast ] Other (See Comments)    Vision loss,         Medication List     TAKE these medications    acyclovir ointment 5 % Commonly known as: ZOVIRAX Apply 1 Application topically 2 (two) times daily as needed (cold sores).   amLODipine  2.5 MG tablet Commonly known as: NORVASC  Take 1 tablet (2.5 mg total) by mouth daily.   aspirin EC 81 MG tablet Take 81 mg by mouth at bedtime.   atorvastatin  10 MG tablet Commonly known as: LIPITOR Take 1 tablet (10 mg total) by mouth daily. What changed: when to take this   buPROPion  150 MG 24 hr tablet Commonly known as: WELLBUTRIN  XL Take 1 tablet (150 mg total) by mouth daily.   busPIRone  5 MG tablet Commonly known as: BUSPAR  Take 1 tablet (5 mg total) by mouth 3 (three) times daily.   cholecalciferol 25 MCG (1000 UNIT) tablet Commonly known as: VITAMIN D3 Take 1,000 Units by mouth at bedtime.   clobetasol  ointment 0.05 % Commonly known as: TEMOVATE  Apply to affected area twice weekly at night.   cyanocobalamin 1000 MCG tablet Commonly known as: VITAMIN B12 Take 1,000 mcg by mouth daily.   estradiol  0.1 MG/GM vaginal cream Commonly known as: ESTRACE  Place 0.5 g vaginally 2 (two) times a week. What changed:  how much to take when to take this   estradiol  0.1 MG/GM vaginal cream Commonly known as: ESTRACE  VAGINAL Place 1 Applicatorful vaginally 3 (three) times a week. Use 1 small dolyp of cream on tip of index finger and swap the inside of the vagina What changed: You were already taking a medication  with the same name, and this prescription was added. Make sure you understand how and when to take each.   Fish Oil 1000 MG Caps Take 1,000 mg by mouth daily.   folic acid  1 MG tablet Commonly known as: FOLVITE  Take 1 tablet (1 mg total) by mouth daily.   ibuprofen  600 MG tablet Commonly known as: ADVIL  Take 1 tablet (600 mg total) by mouth 2 (two) times daily as needed for pain What changed:  how much to take how to take this when to take  this   losartan  100 MG tablet Commonly known as: COZAAR  Take 1 tablet (100 mg total) by mouth daily.   metoprolol  succinate 100 MG 24 hr tablet Commonly known as: TOPROL -XL Take 1 tablet (100 mg total) by mouth daily.   nitroGLYCERIN  0.4 MG SL tablet Commonly known as: NITROSTAT  Place 1 tablet (0.4 mg total) under the tongue every 5 (five) minutes as needed for Chest pain. May take up to 3 doses.  If no relief after first dose, call 911.   PreserVision AREDS 2 Caps Take 1 capsule by mouth in the morning and at bedtime.   Refresh Optive Mega-3 0.5-1-0.5 % Soln Generic drug: Carboxymeth-Glyc-Polysorb PF Place 1 drop into both eyes as needed (dry eyes).   Soothe Nighttime Oint Place 1 Application into both eyes at bedtime.   sulfamethoxazole -trimethoprim  800-160 MG tablet Commonly known as: BACTRIM  DS Take 1 tablet by mouth 2 (two) times daily.   tiZANidine  2 MG tablet Commonly known as: ZANAFLEX  Take 1 tablet by mouth every evening at bedtime as needed for pain. Okay to take 1/2 tablet if 1 full tablet causes sedation. What changed:  how much to take when to take this reasons to take this   tobramycin  0.3 % ophthalmic solution Commonly known as: TOBREX  Instill one drop four times a day in the right eye starting the day before, the day of and the day after your procedure, then stop   traMADol  50 MG tablet Commonly known as: Ultram  Take 1-2 tablets (50-100 mg total) by mouth every 6 (six) hours as needed for moderate pain (pain score 4-6).   valACYclovir  500 MG tablet Commonly known as: VALTREX  Take 1 tablet (500 mg total) by mouth 2 (two) times daily.   zolpidem  5 MG tablet Commonly known as: AMBIEN  Take 1-2 tablets (5-10 mg total) by mouth at bedtime.        Follow-up Information     O'Berry, Rodena BIRCH, NP Follow up.   Specialty: Urology Contact information: [address] [place] [place] [address] [phone]                 Signed: Donnice Brooks 08/26/2023, 12:49 PM

## 2023-08-26 NOTE — Progress Notes (Signed)
 NURSING DISCHARGE NOTE  Discharge instructions were reviewed with the patient and her spouse. Both parties verbally demonstrated understanding of the discharge instructions that were provided.   Patients IV was removed and documented in the patients chart.   At this time of discharge, the patient is alert and oriented, showing no signs/symptoms of distress. Patient is ambulatory with the assistance of a front wheel walker.   Patient to be transported to the main entrance via wheelchair where she will be transported home via her spouse in a private vehicle.   Leonor FORBES Clarks, BSN, RN 08/26/23 2:52 PM

## 2023-08-26 NOTE — Progress Notes (Signed)
 Vaginal packing removed per order at 0515.

## 2023-08-26 NOTE — Progress Notes (Signed)
 Discharge medications delivered to patient at bedside [facility]

## 2023-08-26 NOTE — Plan of Care (Signed)

## 2023-08-28 ENCOUNTER — Other Ambulatory Visit (HOSPITAL_COMMUNITY): Payer: Self-pay

## 2023-08-28 ENCOUNTER — Encounter (HOSPITAL_COMMUNITY): Payer: Self-pay | Admitting: Urology

## 2023-08-28 LAB — SURGICAL PATHOLOGY

## 2023-08-29 ENCOUNTER — Other Ambulatory Visit (HOSPITAL_COMMUNITY): Payer: Self-pay

## 2023-08-30 ENCOUNTER — Other Ambulatory Visit (HOSPITAL_COMMUNITY): Payer: Self-pay

## 2023-08-31 ENCOUNTER — Other Ambulatory Visit (HOSPITAL_COMMUNITY): Payer: Self-pay

## 2023-09-01 ENCOUNTER — Other Ambulatory Visit (HOSPITAL_COMMUNITY): Payer: Self-pay

## 2023-09-02 ENCOUNTER — Other Ambulatory Visit (HOSPITAL_COMMUNITY): Payer: Self-pay

## 2023-09-09 ENCOUNTER — Other Ambulatory Visit (HOSPITAL_COMMUNITY): Payer: Self-pay

## 2023-09-11 ENCOUNTER — Other Ambulatory Visit (HOSPITAL_COMMUNITY): Payer: Self-pay

## 2023-09-12 ENCOUNTER — Telehealth: Payer: Self-pay | Admitting: Neurology

## 2023-09-12 NOTE — Telephone Encounter (Signed)
 Pt called in stating her right hand is extremely painful and it curls to the right, and she cannot move a couple of her fingers. The pain when it is happening is an 8 out of 10. This has been going on for the last couple of weeks. She has been told by other physicians she has a high threshold for pain so she feels it is significant. She just saw Beverley Millman and they felt she needed to tell Dr. Leigh.   She  mentioned she had major abdominal surgery in June.
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
## 2023-09-18 ENCOUNTER — Other Ambulatory Visit (HOSPITAL_COMMUNITY): Payer: Self-pay

## 2023-09-18 ENCOUNTER — Other Ambulatory Visit: Payer: Self-pay

## 2023-09-20 ENCOUNTER — Other Ambulatory Visit (HOSPITAL_COMMUNITY): Payer: Self-pay

## 2023-09-21 ENCOUNTER — Other Ambulatory Visit (HOSPITAL_COMMUNITY): Payer: Self-pay

## 2023-09-21 ENCOUNTER — Other Ambulatory Visit: Payer: Self-pay

## 2023-09-21 ENCOUNTER — Encounter: Payer: Self-pay | Admitting: Neurology

## 2023-09-21 ENCOUNTER — Ambulatory Visit (INDEPENDENT_AMBULATORY_CARE_PROVIDER_SITE_OTHER): Admitting: Neurology

## 2023-09-21 VITALS — BP 130/81 | HR 65 | Ht 62.0 in | Wt 132.0 lb

## 2023-09-21 DIAGNOSIS — M48061 Spinal stenosis, lumbar region without neurogenic claudication: Secondary | ICD-10-CM

## 2023-09-21 DIAGNOSIS — G8929 Other chronic pain: Secondary | ICD-10-CM

## 2023-09-21 DIAGNOSIS — M47812 Spondylosis without myelopathy or radiculopathy, cervical region: Secondary | ICD-10-CM

## 2023-09-21 DIAGNOSIS — R296 Repeated falls: Secondary | ICD-10-CM

## 2023-09-21 DIAGNOSIS — N319 Neuromuscular dysfunction of bladder, unspecified: Secondary | ICD-10-CM

## 2023-09-21 DIAGNOSIS — E538 Deficiency of other specified B group vitamins: Secondary | ICD-10-CM | POA: Diagnosis not present

## 2023-09-21 DIAGNOSIS — R209 Unspecified disturbances of skin sensation: Secondary | ICD-10-CM

## 2023-09-21 DIAGNOSIS — M5417 Radiculopathy, lumbosacral region: Secondary | ICD-10-CM

## 2023-09-21 DIAGNOSIS — M545 Low back pain, unspecified: Secondary | ICD-10-CM | POA: Diagnosis not present

## 2023-09-21 DIAGNOSIS — N3942 Incontinence without sensory awareness: Secondary | ICD-10-CM

## 2023-09-21 NOTE — Patient Instructions (Signed)
 Continue B12 1000 mcg daily  Follow up with urology as planned and increase activity as they will allow.  The physicians and staff at [facility] are committed to providing excellent care. You may receive a survey requesting feedback about your experience at our office. We strive to receive very good responses to the survey questions. If you feel that your experience would prevent you from giving the office a very good  response, please contact our office to try to remedy the situation. We may be reached at [phone]. Thank you for taking the time out of your busy day to complete the survey.  Venetia Potters, MD [facility]  Preventing Falls at [facility] are common, often dreaded events in the lives of older people. Aside from the obvious injuries and even death that may result, fall can cause wide-ranging consequences including loss of independence, mental decline, decreased activity and mobility. Younger people are also at risk of falling, especially those with chronic illnesses and fatigue.  Ways to reduce risk for falling Examine diet and medications. Warm foods and alcohol dilate blood vessels, which can lead to dizziness when standing. Sleep aids, antidepressants and pain medications can also increase the likelihood of a fall.  Get a vision exam. Poor vision, cataracts and glaucoma increase the chances of falling.  Check foot gear. Shoes should fit snugly and have a sturdy, nonskid sole and a broad, low heel  Participate in a physician-approved exercise program to build and maintain muscle strength and improve balance and coordination. Programs that use ankle weights or stretch bands are excellent for muscle-strengthening. Water  aerobics programs and low-impact Tai Chi programs have also been shown to improve balance and coordination.  Increase vitamin D intake. Vitamin D improves muscle strength and increases the amount of calcium  the body is able to absorb and deposit  in bones.  How to prevent falls from common hazards Floors - Remove all loose wires, cords, and throw rugs. Minimize clutter. Make sure rugs are anchored and smooth. Keep furniture in its usual place.  Chairs -- Use chairs with straight backs, armrests and firm seats. Add firm cushions to existing pieces to add height.  Bathroom - Install grab bars and non-skid tape in the tub or shower. Use a bathtub transfer bench or a shower chair with a back support Use an elevated toilet seat and/or safety rails to assist standing from a low surface. Do not use towel racks or bathroom tissue holders to help you stand.  Lighting - Make sure halls, stairways, and entrances are well-lit. Install a night light in your bathroom or hallway. Make sure there is a light switch at the top and bottom of the staircase. Turn lights on if you get up in the middle of the night. Make sure lamps or light switches are within reach of the bed if you have to get up during the night.  Kitchen - Install non-skid rubber mats near the sink and stove. Clean spills immediately. Store frequently used utensils, pots, pans between waist and eye level. This helps prevent reaching and bending. Sit when getting things out of lower cupboards.  Living room/ Bedrooms - Place furniture with wide spaces in between, giving enough room to move around. Establish a route through the living room that gives you something to hold onto as you walk.  Stairs - Make sure treads, rails, and rugs are secure. Install a rail on both sides of the stairs. If stairs are a threat, it might be helpful  to arrange most of your activities on the lower level to reduce the number of times you must climb the stairs.  Entrances and doorways - Install metal handles on the walls adjacent to the doorknobs of all doors to make it more secure as you travel through the doorway.  Tips for maintaining balance Keep at least one hand free at all times. Try using a backpack or  fanny pack to hold things rather than carrying them in your hands. Never carry objects in both hands when walking as this interferes with keeping your balance.  Attempt to swing both arms from front to back while walking. This might require a conscious effort if Parkinson's disease has diminished your movement. It will, however, help you to maintain balance and posture, and reduce fatigue.  Consciously lift your feet off of the ground when walking. Shuffling and dragging of the feet is a common culprit in losing your balance.  When trying to navigate turns, use a U technique of facing forward and making a wide turn, rather than pivoting sharply.  Try to stand with your feet shoulder-length apart. When your feet are close together for any length of time, you increase your risk of losing your balance and falling.  Do one thing at a time. Don't try to walk and accomplish another task, such as reading or looking around. The decrease in your automatic reflexes complicates motor function, so the less distraction, the better.  Do not wear rubber or gripping soled shoes, they might catch on the floor and cause tripping.  Move slowly when changing positions. Use deliberate, concentrated movements and, if needed, use a grab bar or walking aid. Count 15 seconds between each movement. For example, when rising from a seated position, wait 15 seconds after standing to begin walking.  If balance is a continuous problem, you might want to consider a walking aid such as a cane, walking stick, or walker. Once you've mastered walking with help, you might be ready to try it on your own again.

## 2023-10-04 ENCOUNTER — Other Ambulatory Visit (HOSPITAL_COMMUNITY): Payer: Self-pay

## 2023-10-04 ENCOUNTER — Other Ambulatory Visit: Payer: Self-pay

## 2023-10-19 ENCOUNTER — Ambulatory Visit: Admitting: Neurology

## 2023-10-26 ENCOUNTER — Other Ambulatory Visit (HOSPITAL_COMMUNITY): Payer: Self-pay

## 2023-10-26 MED ORDER — CICLOPIROX 8 % EX SOLN
1.0000 | CUTANEOUS | 6 refills | Status: AC
  Filled 2023-10-26: qty 6.6, 30d supply, fill #0
  Filled 2023-11-20: qty 6.6, 30d supply, fill #1
  Filled 2023-12-15: qty 6.6, 30d supply, fill #2
  Filled 2024-01-19: qty 6.6, 30d supply, fill #3
  Filled 2024-02-12 – 2024-02-13 (×2): qty 6.6, 30d supply, fill #4
  Filled 2024-03-16 (×2): qty 6.6, 30d supply, fill #5

## 2023-10-28 ENCOUNTER — Other Ambulatory Visit (HOSPITAL_COMMUNITY): Payer: Self-pay

## 2023-10-31 ENCOUNTER — Other Ambulatory Visit: Payer: Self-pay

## 2023-10-31 ENCOUNTER — Other Ambulatory Visit (HOSPITAL_COMMUNITY): Payer: Self-pay

## 2023-11-08 ENCOUNTER — Other Ambulatory Visit: Payer: Self-pay | Admitting: Family Medicine

## 2023-11-08 DIAGNOSIS — Z1231 Encounter for screening mammogram for malignant neoplasm of breast: Secondary | ICD-10-CM

## 2023-11-14 ENCOUNTER — Other Ambulatory Visit (HOSPITAL_COMMUNITY): Payer: Self-pay

## 2023-11-14 MED ORDER — COVID-19 MRNA VAC-TRIS(PFIZER) 30 MCG/0.3ML IM SUSY
PREFILLED_SYRINGE | INTRAMUSCULAR | 0 refills | Status: AC
  Filled 2023-11-20: qty 0.3, 1d supply, fill #0

## 2023-11-20 ENCOUNTER — Other Ambulatory Visit (HOSPITAL_COMMUNITY): Payer: Self-pay

## 2023-11-23 ENCOUNTER — Other Ambulatory Visit (HOSPITAL_COMMUNITY): Payer: Self-pay

## 2023-11-23 MED ORDER — COVID-19 MRNA VAC-TRIS(PFIZER) 30 MCG/0.3ML IM SUSY
PREFILLED_SYRINGE | INTRAMUSCULAR | 0 refills | Status: AC
  Filled 2023-11-23: qty 0.3, 1d supply, fill #0

## 2023-11-30 ENCOUNTER — Encounter: Payer: Self-pay | Admitting: Neurology

## 2023-12-16 ENCOUNTER — Other Ambulatory Visit (HOSPITAL_COMMUNITY): Payer: Self-pay

## 2023-12-17 ENCOUNTER — Other Ambulatory Visit (HOSPITAL_COMMUNITY): Payer: Self-pay

## 2023-12-18 ENCOUNTER — Other Ambulatory Visit: Payer: Self-pay

## 2023-12-18 ENCOUNTER — Other Ambulatory Visit (HOSPITAL_COMMUNITY): Payer: Self-pay

## 2023-12-20 ENCOUNTER — Other Ambulatory Visit (HOSPITAL_COMMUNITY): Payer: Self-pay

## 2023-12-25 ENCOUNTER — Ambulatory Visit

## 2023-12-29 ENCOUNTER — Other Ambulatory Visit (HOSPITAL_COMMUNITY): Payer: Self-pay

## 2023-12-29 MED ORDER — FLUCONAZOLE 150 MG PO TABS
150.0000 mg | ORAL_TABLET | ORAL | 0 refills | Status: DC
  Filled 2023-12-29: qty 3, 9d supply, fill #0

## 2023-12-29 MED ORDER — TERCONAZOLE 0.4 % VA CREA
1.0000 | TOPICAL_CREAM | Freq: Every day | VAGINAL | 0 refills | Status: DC
  Filled 2023-12-29: qty 45, 7d supply, fill #0

## 2024-01-08 ENCOUNTER — Ambulatory Visit
Admission: RE | Admit: 2024-01-08 | Discharge: 2024-01-08 | Disposition: A | Discharge status: Home / Self Care | Source: Ambulatory Visit | Attending: Family Medicine | Admitting: Family Medicine

## 2024-01-08 DIAGNOSIS — Z1231 Encounter for screening mammogram for malignant neoplasm of breast: Secondary | ICD-10-CM

## 2024-01-10 ENCOUNTER — Other Ambulatory Visit: Payer: Self-pay

## 2024-01-10 ENCOUNTER — Other Ambulatory Visit (HOSPITAL_COMMUNITY): Payer: Self-pay

## 2024-01-11 ENCOUNTER — Other Ambulatory Visit (HOSPITAL_COMMUNITY): Payer: Self-pay

## 2024-01-11 MED ORDER — INTRAROSA 6.5 MG VA INST
6.5000 mg | VAGINAL_INSERT | Freq: Every day | VAGINAL | 2 refills | Status: DC
  Filled 2024-01-11 – 2024-01-23 (×3): qty 28, 28d supply, fill #0
  Filled 2024-02-15 – 2024-03-15 (×4): qty 28, 28d supply, fill #1

## 2024-01-22 ENCOUNTER — Other Ambulatory Visit (HOSPITAL_COMMUNITY): Payer: Self-pay

## 2024-01-23 ENCOUNTER — Other Ambulatory Visit (HOSPITAL_COMMUNITY): Payer: Self-pay

## 2024-01-23 ENCOUNTER — Other Ambulatory Visit: Payer: Self-pay

## 2024-01-25 ENCOUNTER — Other Ambulatory Visit (HOSPITAL_COMMUNITY): Payer: Self-pay

## 2024-02-07 ENCOUNTER — Other Ambulatory Visit: Payer: Self-pay

## 2024-02-07 ENCOUNTER — Other Ambulatory Visit (HOSPITAL_COMMUNITY): Payer: Self-pay

## 2024-02-11 ENCOUNTER — Other Ambulatory Visit (HOSPITAL_COMMUNITY): Payer: Self-pay

## 2024-02-11 MED ORDER — ATORVASTATIN CALCIUM 10 MG PO TABS
10.0000 mg | ORAL_TABLET | Freq: Every day | ORAL | 3 refills | Status: AC
  Filled 2024-04-06: qty 90, 90d supply, fill #0

## 2024-02-11 MED ORDER — BUPROPION HCL ER (XL) 150 MG PO TB24
150.0000 mg | ORAL_TABLET | Freq: Every day | ORAL | 3 refills | Status: AC
  Filled 2024-04-06: qty 90, 90d supply, fill #0

## 2024-02-11 MED ORDER — BUSPIRONE HCL 5 MG PO TABS
5.0000 mg | ORAL_TABLET | ORAL | 3 refills | Status: AC
  Filled 2024-02-12: qty 90, 30d supply, fill #0
  Filled 2024-03-19: qty 90, 30d supply, fill #1

## 2024-02-11 MED ORDER — AMLODIPINE BESYLATE 2.5 MG PO TABS
2.5000 mg | ORAL_TABLET | Freq: Every day | ORAL | 3 refills | Status: AC
  Filled 2024-04-06: qty 90, 90d supply, fill #0

## 2024-02-11 MED ORDER — FOLIC ACID 1 MG PO TABS
1.0000 mg | ORAL_TABLET | Freq: Every day | ORAL | 3 refills | Status: AC
  Filled 2024-04-06: qty 90, 90d supply, fill #0

## 2024-02-11 MED ORDER — ZOLPIDEM TARTRATE 5 MG PO TABS
ORAL_TABLET | ORAL | 3 refills | Status: AC
  Filled 2024-02-12: qty 135, fill #0
  Filled 2024-02-13: qty 135, 67d supply, fill #0

## 2024-02-12 ENCOUNTER — Other Ambulatory Visit: Payer: Self-pay

## 2024-02-12 ENCOUNTER — Other Ambulatory Visit (HOSPITAL_COMMUNITY): Payer: Self-pay

## 2024-02-13 ENCOUNTER — Other Ambulatory Visit (HOSPITAL_COMMUNITY): Payer: Self-pay

## 2024-02-14 ENCOUNTER — Other Ambulatory Visit (HOSPITAL_COMMUNITY): Payer: Self-pay

## 2024-02-15 ENCOUNTER — Other Ambulatory Visit (HOSPITAL_COMMUNITY): Payer: Self-pay

## 2024-02-15 ENCOUNTER — Other Ambulatory Visit: Payer: Self-pay

## 2024-02-16 ENCOUNTER — Other Ambulatory Visit: Payer: Self-pay

## 2024-02-20 ENCOUNTER — Other Ambulatory Visit: Payer: Self-pay

## 2024-02-20 ENCOUNTER — Other Ambulatory Visit (HOSPITAL_COMMUNITY): Payer: Self-pay

## 2024-02-20 MED ORDER — TERCONAZOLE 0.4 % VA CREA
TOPICAL_CREAM | VAGINAL | 0 refills | Status: AC
  Filled 2024-02-20: qty 45, 7d supply, fill #0

## 2024-02-20 MED ORDER — FLUCONAZOLE 150 MG PO TABS
ORAL_TABLET | ORAL | 0 refills | Status: AC
  Filled 2024-02-20: qty 2, 3d supply, fill #0

## 2024-02-20 MED ORDER — VALACYCLOVIR HCL 500 MG PO TABS
500.0000 mg | ORAL_TABLET | Freq: Two times a day (BID) | ORAL | 3 refills | Status: AC
  Filled 2024-02-20: qty 200, 100d supply, fill #0

## 2024-02-20 MED ORDER — ACYCLOVIR 5 % EX OINT
TOPICAL_OINTMENT | CUTANEOUS | 1 refills | Status: AC
  Filled 2024-02-20: qty 30, 30d supply, fill #0
  Filled 2024-03-16 – 2024-04-09 (×3): qty 30, 30d supply, fill #1

## 2024-02-21 ENCOUNTER — Other Ambulatory Visit (HOSPITAL_COMMUNITY): Payer: Self-pay

## 2024-02-21 ENCOUNTER — Other Ambulatory Visit: Payer: Self-pay

## 2024-02-22 ENCOUNTER — Other Ambulatory Visit (HOSPITAL_COMMUNITY): Payer: Self-pay

## 2024-02-23 ENCOUNTER — Other Ambulatory Visit (HOSPITAL_COMMUNITY): Payer: Self-pay

## 2024-03-04 ENCOUNTER — Other Ambulatory Visit (HOSPITAL_COMMUNITY): Payer: Self-pay

## 2024-03-06 ENCOUNTER — Other Ambulatory Visit: Payer: Self-pay

## 2024-03-15 ENCOUNTER — Other Ambulatory Visit (HOSPITAL_COMMUNITY): Payer: Self-pay

## 2024-03-16 ENCOUNTER — Other Ambulatory Visit (HOSPITAL_COMMUNITY): Payer: Self-pay

## 2024-03-18 ENCOUNTER — Other Ambulatory Visit (HOSPITAL_COMMUNITY): Payer: Self-pay

## 2024-03-19 ENCOUNTER — Other Ambulatory Visit (HOSPITAL_COMMUNITY): Payer: Self-pay

## 2024-03-25 ENCOUNTER — Other Ambulatory Visit (HOSPITAL_COMMUNITY): Payer: Self-pay

## 2024-03-26 ENCOUNTER — Other Ambulatory Visit (HOSPITAL_COMMUNITY): Payer: Self-pay

## 2024-03-26 ENCOUNTER — Other Ambulatory Visit: Payer: Self-pay

## 2024-04-03 ENCOUNTER — Other Ambulatory Visit (HOSPITAL_COMMUNITY): Payer: Self-pay

## 2024-04-05 ENCOUNTER — Other Ambulatory Visit (HOSPITAL_COMMUNITY): Payer: Self-pay

## 2024-04-05 MED ORDER — METOPROLOL SUCCINATE ER 100 MG PO TB24
100.0000 mg | ORAL_TABLET | Freq: Every day | ORAL | 3 refills | Status: AC
  Filled 2024-04-05: qty 90, 90d supply, fill #0

## 2024-04-05 MED ORDER — ESTRADIOL 0.01 % VA CREA
0.5000 g | TOPICAL_CREAM | VAGINAL | 11 refills | Status: AC
  Filled 2024-04-05: qty 42.5, 30d supply, fill #0

## 2024-04-05 MED ORDER — METOPROLOL SUCCINATE ER 50 MG PO TB24
50.0000 mg | ORAL_TABLET | Freq: Every day | ORAL | 4 refills | Status: AC
  Filled 2024-04-05: qty 90, 90d supply, fill #0

## 2024-04-05 MED ORDER — ATORVASTATIN CALCIUM 20 MG PO TABS
20.0000 mg | ORAL_TABLET | Freq: Every day | ORAL | 0 refills | Status: AC
  Filled 2024-04-05: qty 90, 90d supply, fill #0

## 2024-04-06 ENCOUNTER — Other Ambulatory Visit (HOSPITAL_COMMUNITY): Payer: Self-pay

## 2024-04-07 ENCOUNTER — Other Ambulatory Visit: Payer: Self-pay

## 2024-04-08 ENCOUNTER — Other Ambulatory Visit (HOSPITAL_COMMUNITY): Payer: Self-pay

## 2024-04-08 MED ORDER — LOSARTAN POTASSIUM 100 MG PO TABS
100.0000 mg | ORAL_TABLET | Freq: Every day | ORAL | 3 refills | Status: AC
  Filled 2024-04-08: qty 90, 90d supply, fill #0

## 2024-04-09 ENCOUNTER — Other Ambulatory Visit (HOSPITAL_COMMUNITY): Payer: Self-pay

## 2024-04-12 ENCOUNTER — Other Ambulatory Visit (HOSPITAL_COMMUNITY): Payer: Self-pay

## 2024-06-07 ENCOUNTER — Ambulatory Visit: Admitting: Neurology

## 2024-06-12 ENCOUNTER — Ambulatory Visit: Admitting: Neurology

## 2024-06-13 ENCOUNTER — Ambulatory Visit: Admitting: Neurology
